# Patient Record
Sex: Female | Born: 1940 | Race: White | Hispanic: No | Marital: Married | Smoking: Never smoker
Health system: Southern US, Community
[De-identification: ages and names within clinical notes are randomized; demographics above are authoritative.]

## PROBLEM LIST (undated history)

## (undated) DIAGNOSIS — Z953 Presence of xenogenic heart valve: Secondary | ICD-10-CM

## (undated) DIAGNOSIS — I951 Orthostatic hypotension: Secondary | ICD-10-CM

## (undated) DIAGNOSIS — E785 Hyperlipidemia, unspecified: Secondary | ICD-10-CM

## (undated) DIAGNOSIS — R0609 Other forms of dyspnea: Secondary | ICD-10-CM

## (undated) DIAGNOSIS — I4891 Unspecified atrial fibrillation: Secondary | ICD-10-CM

## (undated) DIAGNOSIS — Q231 Congenital insufficiency of aortic valve: Secondary | ICD-10-CM

## (undated) DIAGNOSIS — I350 Nonrheumatic aortic (valve) stenosis: Secondary | ICD-10-CM

## (undated) DIAGNOSIS — K219 Gastro-esophageal reflux disease without esophagitis: Secondary | ICD-10-CM

## (undated) DIAGNOSIS — Z5181 Encounter for therapeutic drug level monitoring: Secondary | ICD-10-CM

## (undated) DIAGNOSIS — M1990 Unspecified osteoarthritis, unspecified site: Secondary | ICD-10-CM

## (undated) DIAGNOSIS — I5032 Chronic diastolic (congestive) heart failure: Secondary | ICD-10-CM

## (undated) DIAGNOSIS — E039 Hypothyroidism, unspecified: Secondary | ICD-10-CM

## (undated) DIAGNOSIS — Z9289 Personal history of other medical treatment: Secondary | ICD-10-CM

## (undated) DIAGNOSIS — M7989 Other specified soft tissue disorders: Secondary | ICD-10-CM

## (undated) DIAGNOSIS — G459 Transient cerebral ischemic attack, unspecified: Secondary | ICD-10-CM

## (undated) DIAGNOSIS — N390 Urinary tract infection, site not specified: Secondary | ICD-10-CM

## (undated) DIAGNOSIS — F419 Anxiety disorder, unspecified: Secondary | ICD-10-CM

## (undated) DIAGNOSIS — K222 Esophageal obstruction: Secondary | ICD-10-CM

## (undated) DIAGNOSIS — Z8719 Personal history of other diseases of the digestive system: Secondary | ICD-10-CM

## (undated) DIAGNOSIS — K52831 Collagenous colitis: Secondary | ICD-10-CM

## (undated) DIAGNOSIS — I9789 Other postprocedural complications and disorders of the circulatory system, not elsewhere classified: Secondary | ICD-10-CM

## (undated) HISTORY — DX: Encounter for therapeutic drug level monitoring: Z51.81

## (undated) HISTORY — PX: ESOPHAGEAL DILATION: SHX303

## (undated) HISTORY — DX: Orthostatic hypotension: I95.1

## (undated) HISTORY — PX: TONSILLECTOMY: SUR1361

## (undated) HISTORY — DX: Other forms of dyspnea: R06.09

## (undated) HISTORY — PX: CARDIAC CATHETERIZATION: SHX172

## (undated) HISTORY — DX: Congenital insufficiency of aortic valve: Q23.1

## (undated) HISTORY — PX: OTHER SURGICAL HISTORY: SHX169

## (undated) HISTORY — DX: Transient cerebral ischemic attack, unspecified: G45.9

## (undated) HISTORY — DX: Collagenous colitis: K52.831

## (undated) HISTORY — PX: APPENDECTOMY: SHX54

## (undated) HISTORY — DX: Nonrheumatic aortic (valve) stenosis: I35.0

## (undated) HISTORY — DX: Hypothyroidism, unspecified: E03.9

## (undated) HISTORY — PX: TOTAL ABDOMINAL HYSTERECTOMY: SHX209

## (undated) HISTORY — DX: Chronic diastolic (congestive) heart failure: I50.32

## (undated) HISTORY — DX: Personal history of other medical treatment: Z92.89

## (undated) HISTORY — PX: CHOLECYSTECTOMY: SHX55

## (undated) HISTORY — DX: Hyperlipidemia, unspecified: E78.5

## (undated) HISTORY — PX: BLADDER SURGERY: SHX569

---

## 1993-08-25 HISTORY — PX: NASAL SEPTUM SURGERY: SHX37

## 1999-09-25 ENCOUNTER — Other Ambulatory Visit: Admission: RE | Admit: 1999-09-25 | Discharge: 1999-09-25 | Payer: Self-pay | Admitting: Endocrinology

## 1999-11-19 ENCOUNTER — Ambulatory Visit (HOSPITAL_COMMUNITY): Admission: RE | Admit: 1999-11-19 | Discharge: 1999-11-19 | Payer: Self-pay | Admitting: Gastroenterology

## 2001-12-31 ENCOUNTER — Inpatient Hospital Stay (HOSPITAL_COMMUNITY): Admission: RE | Admit: 2001-12-31 | Discharge: 2002-01-02 | Payer: Self-pay | Admitting: Obstetrics and Gynecology

## 2003-08-15 ENCOUNTER — Ambulatory Visit (HOSPITAL_COMMUNITY): Admission: RE | Admit: 2003-08-15 | Discharge: 2003-08-15 | Payer: Self-pay | Admitting: Gastroenterology

## 2003-09-22 ENCOUNTER — Ambulatory Visit (HOSPITAL_COMMUNITY): Admission: RE | Admit: 2003-09-22 | Discharge: 2003-09-22 | Payer: Self-pay | Admitting: Gastroenterology

## 2003-10-09 ENCOUNTER — Encounter (HOSPITAL_COMMUNITY): Admission: RE | Admit: 2003-10-09 | Discharge: 2004-01-07 | Payer: Self-pay | Admitting: Endocrinology

## 2004-08-25 HISTORY — PX: THUMB ARTHROSCOPY: SHX2509

## 2004-11-15 ENCOUNTER — Ambulatory Visit: Payer: Self-pay

## 2010-11-22 ENCOUNTER — Other Ambulatory Visit (HOSPITAL_COMMUNITY): Payer: Self-pay | Admitting: Endocrinology

## 2010-11-22 DIAGNOSIS — R0600 Dyspnea, unspecified: Secondary | ICD-10-CM

## 2010-11-26 ENCOUNTER — Ambulatory Visit (HOSPITAL_COMMUNITY): Payer: Medicare Other | Attending: Endocrinology

## 2010-11-26 DIAGNOSIS — R0609 Other forms of dyspnea: Secondary | ICD-10-CM | POA: Insufficient documentation

## 2010-11-26 DIAGNOSIS — R011 Cardiac murmur, unspecified: Secondary | ICD-10-CM

## 2010-11-26 DIAGNOSIS — R0989 Other specified symptoms and signs involving the circulatory and respiratory systems: Secondary | ICD-10-CM | POA: Insufficient documentation

## 2010-11-26 DIAGNOSIS — R0600 Dyspnea, unspecified: Secondary | ICD-10-CM

## 2011-08-26 DIAGNOSIS — K222 Esophageal obstruction: Secondary | ICD-10-CM

## 2011-08-26 HISTORY — DX: Esophageal obstruction: K22.2

## 2011-09-01 DIAGNOSIS — Z1231 Encounter for screening mammogram for malignant neoplasm of breast: Secondary | ICD-10-CM | POA: Diagnosis not present

## 2011-09-01 DIAGNOSIS — Z803 Family history of malignant neoplasm of breast: Secondary | ICD-10-CM | POA: Diagnosis not present

## 2011-09-22 DIAGNOSIS — N993 Prolapse of vaginal vault after hysterectomy: Secondary | ICD-10-CM | POA: Diagnosis not present

## 2011-09-22 DIAGNOSIS — N8110 Cystocele, unspecified: Secondary | ICD-10-CM | POA: Diagnosis not present

## 2011-09-29 DIAGNOSIS — N8110 Cystocele, unspecified: Secondary | ICD-10-CM | POA: Diagnosis not present

## 2011-09-29 DIAGNOSIS — Z01818 Encounter for other preprocedural examination: Secondary | ICD-10-CM | POA: Diagnosis not present

## 2011-09-29 DIAGNOSIS — Z01811 Encounter for preprocedural respiratory examination: Secondary | ICD-10-CM | POA: Diagnosis not present

## 2011-09-29 DIAGNOSIS — I498 Other specified cardiac arrhythmias: Secondary | ICD-10-CM | POA: Diagnosis not present

## 2011-10-03 DIAGNOSIS — R011 Cardiac murmur, unspecified: Secondary | ICD-10-CM | POA: Diagnosis not present

## 2011-10-03 DIAGNOSIS — N760 Acute vaginitis: Secondary | ICD-10-CM | POA: Diagnosis not present

## 2011-10-03 DIAGNOSIS — K219 Gastro-esophageal reflux disease without esophagitis: Secondary | ICD-10-CM | POA: Diagnosis not present

## 2011-10-03 DIAGNOSIS — E039 Hypothyroidism, unspecified: Secondary | ICD-10-CM | POA: Diagnosis not present

## 2011-10-03 DIAGNOSIS — N390 Urinary tract infection, site not specified: Secondary | ICD-10-CM | POA: Diagnosis not present

## 2011-10-03 DIAGNOSIS — N993 Prolapse of vaginal vault after hysterectomy: Secondary | ICD-10-CM | POA: Diagnosis not present

## 2011-10-04 DIAGNOSIS — N390 Urinary tract infection, site not specified: Secondary | ICD-10-CM | POA: Diagnosis not present

## 2011-10-04 DIAGNOSIS — E039 Hypothyroidism, unspecified: Secondary | ICD-10-CM | POA: Diagnosis not present

## 2011-10-04 DIAGNOSIS — N993 Prolapse of vaginal vault after hysterectomy: Secondary | ICD-10-CM | POA: Diagnosis not present

## 2011-10-04 DIAGNOSIS — R011 Cardiac murmur, unspecified: Secondary | ICD-10-CM | POA: Diagnosis not present

## 2011-10-04 DIAGNOSIS — K219 Gastro-esophageal reflux disease without esophagitis: Secondary | ICD-10-CM | POA: Diagnosis not present

## 2011-10-05 DIAGNOSIS — E039 Hypothyroidism, unspecified: Secondary | ICD-10-CM | POA: Diagnosis not present

## 2011-10-05 DIAGNOSIS — N390 Urinary tract infection, site not specified: Secondary | ICD-10-CM | POA: Diagnosis not present

## 2011-10-05 DIAGNOSIS — R011 Cardiac murmur, unspecified: Secondary | ICD-10-CM | POA: Diagnosis not present

## 2011-10-05 DIAGNOSIS — K219 Gastro-esophageal reflux disease without esophagitis: Secondary | ICD-10-CM | POA: Diagnosis not present

## 2011-10-05 DIAGNOSIS — N993 Prolapse of vaginal vault after hysterectomy: Secondary | ICD-10-CM | POA: Diagnosis not present

## 2011-10-13 DIAGNOSIS — N8110 Cystocele, unspecified: Secondary | ICD-10-CM | POA: Diagnosis not present

## 2011-10-13 DIAGNOSIS — Z48816 Encounter for surgical aftercare following surgery on the genitourinary system: Secondary | ICD-10-CM | POA: Diagnosis not present

## 2011-11-10 DIAGNOSIS — N993 Prolapse of vaginal vault after hysterectomy: Secondary | ICD-10-CM | POA: Diagnosis not present

## 2011-12-02 DIAGNOSIS — K5289 Other specified noninfective gastroenteritis and colitis: Secondary | ICD-10-CM | POA: Diagnosis not present

## 2011-12-02 DIAGNOSIS — R209 Unspecified disturbances of skin sensation: Secondary | ICD-10-CM | POA: Diagnosis not present

## 2011-12-02 DIAGNOSIS — E039 Hypothyroidism, unspecified: Secondary | ICD-10-CM | POA: Diagnosis not present

## 2011-12-02 DIAGNOSIS — E785 Hyperlipidemia, unspecified: Secondary | ICD-10-CM | POA: Diagnosis not present

## 2012-02-02 DIAGNOSIS — Z48816 Encounter for surgical aftercare following surgery on the genitourinary system: Secondary | ICD-10-CM | POA: Diagnosis not present

## 2012-02-02 DIAGNOSIS — R32 Unspecified urinary incontinence: Secondary | ICD-10-CM | POA: Diagnosis not present

## 2012-05-04 DIAGNOSIS — E039 Hypothyroidism, unspecified: Secondary | ICD-10-CM | POA: Diagnosis not present

## 2012-05-04 DIAGNOSIS — N390 Urinary tract infection, site not specified: Secondary | ICD-10-CM | POA: Diagnosis not present

## 2012-05-04 DIAGNOSIS — K5289 Other specified noninfective gastroenteritis and colitis: Secondary | ICD-10-CM | POA: Diagnosis not present

## 2012-05-04 DIAGNOSIS — E785 Hyperlipidemia, unspecified: Secondary | ICD-10-CM | POA: Diagnosis not present

## 2012-05-04 DIAGNOSIS — Z23 Encounter for immunization: Secondary | ICD-10-CM | POA: Diagnosis not present

## 2012-05-17 DIAGNOSIS — M79609 Pain in unspecified limb: Secondary | ICD-10-CM | POA: Diagnosis not present

## 2012-09-01 DIAGNOSIS — Z1231 Encounter for screening mammogram for malignant neoplasm of breast: Secondary | ICD-10-CM | POA: Diagnosis not present

## 2012-10-05 DIAGNOSIS — E039 Hypothyroidism, unspecified: Secondary | ICD-10-CM | POA: Diagnosis not present

## 2012-10-05 DIAGNOSIS — E785 Hyperlipidemia, unspecified: Secondary | ICD-10-CM | POA: Diagnosis not present

## 2012-10-12 DIAGNOSIS — J984 Other disorders of lung: Secondary | ICD-10-CM | POA: Diagnosis not present

## 2012-10-12 DIAGNOSIS — Z23 Encounter for immunization: Secondary | ICD-10-CM | POA: Diagnosis not present

## 2012-10-12 DIAGNOSIS — Z124 Encounter for screening for malignant neoplasm of cervix: Secondary | ICD-10-CM | POA: Diagnosis not present

## 2012-10-12 DIAGNOSIS — K222 Esophageal obstruction: Secondary | ICD-10-CM | POA: Diagnosis not present

## 2012-10-12 DIAGNOSIS — Z0000 Encounter for general adult medical examination without abnormal findings: Secondary | ICD-10-CM | POA: Diagnosis not present

## 2012-10-12 DIAGNOSIS — R110 Nausea: Secondary | ICD-10-CM | POA: Diagnosis not present

## 2012-10-12 DIAGNOSIS — K5289 Other specified noninfective gastroenteritis and colitis: Secondary | ICD-10-CM | POA: Diagnosis not present

## 2012-10-14 DIAGNOSIS — R112 Nausea with vomiting, unspecified: Secondary | ICD-10-CM | POA: Diagnosis not present

## 2012-10-14 DIAGNOSIS — Z1212 Encounter for screening for malignant neoplasm of rectum: Secondary | ICD-10-CM | POA: Diagnosis not present

## 2012-10-14 DIAGNOSIS — R1310 Dysphagia, unspecified: Secondary | ICD-10-CM | POA: Diagnosis not present

## 2012-10-14 DIAGNOSIS — R634 Abnormal weight loss: Secondary | ICD-10-CM | POA: Diagnosis not present

## 2012-11-18 ENCOUNTER — Other Ambulatory Visit: Payer: Self-pay | Admitting: Gastroenterology

## 2012-11-18 DIAGNOSIS — R634 Abnormal weight loss: Secondary | ICD-10-CM | POA: Diagnosis not present

## 2012-11-18 DIAGNOSIS — D1330 Benign neoplasm of unspecified part of small intestine: Secondary | ICD-10-CM | POA: Diagnosis not present

## 2012-11-18 DIAGNOSIS — R1310 Dysphagia, unspecified: Secondary | ICD-10-CM | POA: Diagnosis not present

## 2012-11-18 DIAGNOSIS — K449 Diaphragmatic hernia without obstruction or gangrene: Secondary | ICD-10-CM | POA: Diagnosis not present

## 2012-11-18 DIAGNOSIS — K222 Esophageal obstruction: Secondary | ICD-10-CM | POA: Diagnosis not present

## 2012-12-13 DIAGNOSIS — K219 Gastro-esophageal reflux disease without esophagitis: Secondary | ICD-10-CM | POA: Diagnosis not present

## 2012-12-13 DIAGNOSIS — K222 Esophageal obstruction: Secondary | ICD-10-CM | POA: Diagnosis not present

## 2012-12-13 DIAGNOSIS — R1032 Left lower quadrant pain: Secondary | ICD-10-CM | POA: Diagnosis not present

## 2013-04-05 DIAGNOSIS — H52209 Unspecified astigmatism, unspecified eye: Secondary | ICD-10-CM | POA: Diagnosis not present

## 2013-04-05 DIAGNOSIS — H2510 Age-related nuclear cataract, unspecified eye: Secondary | ICD-10-CM | POA: Diagnosis not present

## 2013-04-12 DIAGNOSIS — K5289 Other specified noninfective gastroenteritis and colitis: Secondary | ICD-10-CM | POA: Diagnosis not present

## 2013-04-12 DIAGNOSIS — E039 Hypothyroidism, unspecified: Secondary | ICD-10-CM | POA: Diagnosis not present

## 2013-04-12 DIAGNOSIS — Z1331 Encounter for screening for depression: Secondary | ICD-10-CM | POA: Diagnosis not present

## 2013-04-12 DIAGNOSIS — N390 Urinary tract infection, site not specified: Secondary | ICD-10-CM | POA: Diagnosis not present

## 2013-04-12 DIAGNOSIS — K219 Gastro-esophageal reflux disease without esophagitis: Secondary | ICD-10-CM | POA: Diagnosis not present

## 2013-04-12 DIAGNOSIS — K222 Esophageal obstruction: Secondary | ICD-10-CM | POA: Diagnosis not present

## 2013-04-12 DIAGNOSIS — E785 Hyperlipidemia, unspecified: Secondary | ICD-10-CM | POA: Diagnosis not present

## 2013-04-12 DIAGNOSIS — D126 Benign neoplasm of colon, unspecified: Secondary | ICD-10-CM | POA: Diagnosis not present

## 2013-05-12 DIAGNOSIS — M1710 Unilateral primary osteoarthritis, unspecified knee: Secondary | ICD-10-CM | POA: Diagnosis not present

## 2013-05-19 DIAGNOSIS — Z23 Encounter for immunization: Secondary | ICD-10-CM | POA: Diagnosis not present

## 2013-09-02 DIAGNOSIS — Z1231 Encounter for screening mammogram for malignant neoplasm of breast: Secondary | ICD-10-CM | POA: Diagnosis not present

## 2013-10-10 DIAGNOSIS — E039 Hypothyroidism, unspecified: Secondary | ICD-10-CM | POA: Diagnosis not present

## 2013-10-10 DIAGNOSIS — E785 Hyperlipidemia, unspecified: Secondary | ICD-10-CM | POA: Diagnosis not present

## 2013-10-17 DIAGNOSIS — Z0000 Encounter for general adult medical examination without abnormal findings: Secondary | ICD-10-CM | POA: Diagnosis not present

## 2013-10-17 DIAGNOSIS — K5289 Other specified noninfective gastroenteritis and colitis: Secondary | ICD-10-CM | POA: Diagnosis not present

## 2013-10-17 DIAGNOSIS — K222 Esophageal obstruction: Secondary | ICD-10-CM | POA: Diagnosis not present

## 2013-10-17 DIAGNOSIS — K219 Gastro-esophageal reflux disease without esophagitis: Secondary | ICD-10-CM | POA: Diagnosis not present

## 2013-10-17 DIAGNOSIS — E039 Hypothyroidism, unspecified: Secondary | ICD-10-CM | POA: Diagnosis not present

## 2013-10-17 DIAGNOSIS — E785 Hyperlipidemia, unspecified: Secondary | ICD-10-CM | POA: Diagnosis not present

## 2013-10-17 DIAGNOSIS — D126 Benign neoplasm of colon, unspecified: Secondary | ICD-10-CM | POA: Diagnosis not present

## 2013-10-17 DIAGNOSIS — Q231 Congenital insufficiency of aortic valve: Secondary | ICD-10-CM | POA: Diagnosis not present

## 2013-11-22 DIAGNOSIS — Z780 Asymptomatic menopausal state: Secondary | ICD-10-CM | POA: Diagnosis not present

## 2014-04-11 DIAGNOSIS — H40019 Open angle with borderline findings, low risk, unspecified eye: Secondary | ICD-10-CM | POA: Diagnosis not present

## 2014-04-11 DIAGNOSIS — H2510 Age-related nuclear cataract, unspecified eye: Secondary | ICD-10-CM | POA: Diagnosis not present

## 2014-04-11 DIAGNOSIS — H52209 Unspecified astigmatism, unspecified eye: Secondary | ICD-10-CM | POA: Diagnosis not present

## 2014-04-11 DIAGNOSIS — H524 Presbyopia: Secondary | ICD-10-CM | POA: Diagnosis not present

## 2014-04-17 DIAGNOSIS — I951 Orthostatic hypotension: Secondary | ICD-10-CM | POA: Diagnosis not present

## 2014-04-17 DIAGNOSIS — R300 Dysuria: Secondary | ICD-10-CM | POA: Diagnosis not present

## 2014-04-17 DIAGNOSIS — E785 Hyperlipidemia, unspecified: Secondary | ICD-10-CM | POA: Diagnosis not present

## 2014-04-17 DIAGNOSIS — Z6827 Body mass index (BMI) 27.0-27.9, adult: Secondary | ICD-10-CM | POA: Diagnosis not present

## 2014-04-17 DIAGNOSIS — E039 Hypothyroidism, unspecified: Secondary | ICD-10-CM | POA: Diagnosis not present

## 2014-04-17 DIAGNOSIS — K5289 Other specified noninfective gastroenteritis and colitis: Secondary | ICD-10-CM | POA: Diagnosis not present

## 2014-05-09 ENCOUNTER — Encounter: Payer: Self-pay | Admitting: Cardiology

## 2014-05-09 ENCOUNTER — Ambulatory Visit (INDEPENDENT_AMBULATORY_CARE_PROVIDER_SITE_OTHER): Payer: Medicare Other | Admitting: Cardiology

## 2014-05-09 VITALS — BP 118/80 | HR 83 | Wt 161.0 lb

## 2014-05-09 DIAGNOSIS — Q231 Congenital insufficiency of aortic valve: Secondary | ICD-10-CM

## 2014-05-09 DIAGNOSIS — I951 Orthostatic hypotension: Secondary | ICD-10-CM

## 2014-05-09 DIAGNOSIS — R0989 Other specified symptoms and signs involving the circulatory and respiratory systems: Secondary | ICD-10-CM

## 2014-05-09 DIAGNOSIS — R0609 Other forms of dyspnea: Secondary | ICD-10-CM

## 2014-05-09 LAB — BRAIN NATRIURETIC PEPTIDE: PRO B NATRI PEPTIDE: 53 pg/mL (ref 0.0–100.0)

## 2014-05-09 NOTE — Patient Instructions (Addendum)
Your physician recommends that you have  lab work today--BNP.  Your physician has requested that you have an echocardiogram. Echocardiography is a painless test that uses sound waves to create images of your heart. It provides your doctor with information about the size and shape of your heart and how well your heart's chambers and valves are working. This procedure takes approximately one hour. There are no restrictions for this procedure.  Your physician has requested that you have en exercise stress myoview. For further information please visit [email or web address]. Please follow instruction sheet, as given. PLEASE SCHEDULE THIS AFTER THE ECHOCARDIOGRAM HAS BEEN DONE TO ENSURE THE PATIENT DOES NOT HAVE SEVERE AORTIC STENOSIS   Schedule an appointment for an MRA of your chest.   Your physician recommends that you schedule a follow-up appointment in: 3-4 weeks with Dr Aundra Dubin.   Use knee high graded compression stockings to help the swelling in your feet and legs. Put them on in the morning and take them off at night.

## 2014-05-10 DIAGNOSIS — I951 Orthostatic hypotension: Secondary | ICD-10-CM

## 2014-05-10 DIAGNOSIS — R0600 Dyspnea, unspecified: Secondary | ICD-10-CM

## 2014-05-10 DIAGNOSIS — R0609 Other forms of dyspnea: Secondary | ICD-10-CM

## 2014-05-10 DIAGNOSIS — Q231 Congenital insufficiency of aortic valve: Secondary | ICD-10-CM | POA: Insufficient documentation

## 2014-05-10 DIAGNOSIS — Q2381 Bicuspid aortic valve: Secondary | ICD-10-CM

## 2014-05-10 HISTORY — DX: Dyspnea, unspecified: R06.00

## 2014-05-10 HISTORY — DX: Orthostatic hypotension: I95.1

## 2014-05-10 HISTORY — DX: Congenital insufficiency of aortic valve: Q23.1

## 2014-05-10 HISTORY — DX: Other forms of dyspnea: R06.09

## 2014-05-10 HISTORY — DX: Bicuspid aortic valve: Q23.81

## 2014-05-10 NOTE — Progress Notes (Signed)
Patient ID: Becky Winters, female   DOB: [birth year], 73 y.o.   MRN: [id number] PCP: Dr. Forde Dandy  73 yo with history of bicuspid aortic valve and mild aortic stenosis, hyperlipidemia, and h/o prior TIA presents for cardiology evaluation of orthostatic-type symptoms as well as exertional dyspnea.  Over the last few months, patient's home BP readings have been in the 62G-315V systolic. She has occasional lightheadedness when doing activities like getting dressed, cooking, etc (not always).  BP today was 122/70.  She was not orthostatic today when we measured though she was mildly lightheaded with standing. TSH and cortisol were normal recently.  No falls or syncope.  She additionally reports dyspnea when she bends over.  She is short of breath doing yardwork, getting dressed, or with other moderate activities.  No chest pain.  She is not short of breath walking on flat ground.  She is not taking aspirin due to her collagenous colitis.   ECG: NSR, normal  Labs (8/15): K 5, creatinine 0.9, cortisol normal, LDL 58, HDL 38, TSH/free T4 normal, LFTs normal  PMH: 1. Collagenous colitis 2. TIA 3. Hypothyroidism 4. Hyperlipidemia 5. Cardiolite in 2003 and 2006 normal.  6. Esophageal stricture. 7. Bicuspid aortic valve: Diagnosed on 4/12 echo, mild AS.  8. Cholecystectomy 9. TAH 10. Appendectomy  SH: Married, 2 children, nonsmoker, lives in [place].   FH: Father with CAD diagnosed at 38, grandmother with CVA.   ROS: All systems reviewed and negative except as per HPI.   Current Outpatient Prescriptions  Medication Sig Dispense Refill  . atorvastatin (LIPITOR) 40 MG tablet Take 40 mg by mouth daily. Takes 1/2 each night      . b complex vitamins capsule Take 1 capsule by mouth daily.      . beta carotene w/minerals (OCUVITE) tablet Take 1 tablet by mouth daily.      . Biotin 5000 MCG CAPS Take by mouth.      . Cholecalciferol (VITAMIN D3) 1000 UNITS CAPS Take by mouth.      . Cinnamon 500 MG  TABS Take by mouth.      . ciprofloxacin (CIPRO) 500 MG tablet       . co-enzyme Q-10 50 MG capsule Take 100 mg by mouth 2 (two) times daily.      . diphenhydrAMINE (SOMINEX) 25 MG tablet Take 25 mg by mouth at bedtime as needed for sleep.      . folic acid (FOLVITE) 761 MCG tablet Take 400 mcg by mouth daily.      . lansoprazole (PREVACID) 15 MG capsule Take 15 mg by mouth as needed.      Marland Kitchen levothyroxine (SYNTHROID, LEVOTHROID) 125 MCG tablet       . Melatonin-Pyridoxine (MELATIN PO) Take by mouth.      . Multiple Vitamins-Minerals (MULTIVITAMIN WITH MINERALS) tablet Take 1 tablet by mouth daily.      Jonna Coup Leaf Extract 500 MG CAPS Take by mouth.      . Omega-3 Fatty Acids (FISH OIL CONCENTRATE) 1000 MG CAPS Take 1,000 mg by mouth 2 (two) times daily.      . Probiotic Product (ALIGN PO) Take by mouth.      . vitamin E 400 UNIT capsule Take 400 Units by mouth daily.       No current facility-administered medications for this visit.    BP 118/80  Pulse 83  Wt 161 lb (73.029 kg) General: NAD Neck: No JVD, no thyromegaly or thyroid nodule.  Lungs: Clear  to auscultation bilaterally with normal respiratory effort. CV: Nondisplaced PMI.  Heart regular S1/S2, no S3/S4, 3/6 crescendo-decrescendo systolic murmur, S2 heard well.  No peripheral edema.  No carotid bruit.  Normal pedal pulses.  Abdomen: Soft, nontender, no hepatosplenomegaly, no distention.  Skin: Intact without lesions or rashes.  Neurologic: Alert and oriented x 3.  Psych: Normal affect. Extremities: No clubbing or cyanosis.  HEENT: Normal.   Assessment/Plan: 1. Aortic stenosis: Bicuspid aortic valve disorder.  She will need an echo to assess for progression of aortic stenosis.  If this has worsened, it could explain her dyspnea and her lightheadedness.  Given the bicuspid valve, I am going to get an MRA of her chest to assess for thoracic aortic aneurysm which often coexists with a bicuspid aortic valve.   2. Exertional  dyspnea: She is not volume overloaded on exam.  I will, however, send a BNP.  I am also getting an echo as above.  Given her hyperlipidemia and family history of vascular disease, I think that it would be reasonable to make sure that her exertional symptoms are not ischemia-related.  I will arrange for ETT-Cardiolite (as long she does not have severe AS, will get echo 1st).  3. Orthostatic symptoms: She does not have orthostatic hypotension by BP measurement today, but has had dizziness with activities over the last couple of months.   - I will have her wear graded compression stockings.  - As above, will get echo to look for worsening AS.  Loralie Champagne 05/10/2014

## 2014-05-15 ENCOUNTER — Encounter: Payer: Self-pay | Admitting: Internal Medicine

## 2014-05-16 ENCOUNTER — Ambulatory Visit (HOSPITAL_COMMUNITY): Payer: Medicare Other | Attending: Cardiology | Admitting: Cardiology

## 2014-05-16 DIAGNOSIS — R0609 Other forms of dyspnea: Secondary | ICD-10-CM | POA: Insufficient documentation

## 2014-05-16 DIAGNOSIS — I359 Nonrheumatic aortic valve disorder, unspecified: Secondary | ICD-10-CM

## 2014-05-16 DIAGNOSIS — Q231 Congenital insufficiency of aortic valve: Secondary | ICD-10-CM | POA: Insufficient documentation

## 2014-05-16 DIAGNOSIS — R0989 Other specified symptoms and signs involving the circulatory and respiratory systems: Secondary | ICD-10-CM | POA: Insufficient documentation

## 2014-05-16 DIAGNOSIS — E785 Hyperlipidemia, unspecified: Secondary | ICD-10-CM | POA: Diagnosis not present

## 2014-05-16 NOTE — Progress Notes (Signed)
Echo performed. 

## 2014-05-17 ENCOUNTER — Telehealth: Payer: Self-pay

## 2014-05-17 ENCOUNTER — Other Ambulatory Visit: Payer: Self-pay

## 2014-05-17 NOTE — Telephone Encounter (Signed)
Called patient to tell her that Aundra Dubin cancelled her stress test. (And appointment has been cancelled) Informed patient that [place], will call her when she is back in the office with an appointment. Told patient to call the office with any questions or concerns.

## 2014-05-18 ENCOUNTER — Encounter: Payer: Self-pay | Admitting: *Deleted

## 2014-05-19 ENCOUNTER — Encounter: Payer: Self-pay | Admitting: Cardiology

## 2014-05-19 ENCOUNTER — Encounter: Payer: Self-pay | Admitting: *Deleted

## 2014-05-19 ENCOUNTER — Ambulatory Visit (HOSPITAL_COMMUNITY)
Admission: RE | Admit: 2014-05-19 | Discharge: 2014-05-19 | Disposition: A | Discharge status: Home / Self Care | Payer: Medicare Other | Source: Ambulatory Visit | Attending: Cardiology | Admitting: Cardiology

## 2014-05-19 ENCOUNTER — Ambulatory Visit (INDEPENDENT_AMBULATORY_CARE_PROVIDER_SITE_OTHER): Payer: Medicare Other | Admitting: Cardiology

## 2014-05-19 VITALS — BP 132/68 | HR 64 | Ht 65.0 in | Wt 156.0 lb

## 2014-05-19 DIAGNOSIS — Q231 Congenital insufficiency of aortic valve: Secondary | ICD-10-CM | POA: Diagnosis not present

## 2014-05-19 DIAGNOSIS — R079 Chest pain, unspecified: Secondary | ICD-10-CM | POA: Diagnosis not present

## 2014-05-19 DIAGNOSIS — I359 Nonrheumatic aortic valve disorder, unspecified: Secondary | ICD-10-CM

## 2014-05-19 DIAGNOSIS — I350 Nonrheumatic aortic (valve) stenosis: Secondary | ICD-10-CM

## 2014-05-19 DIAGNOSIS — R0989 Other specified symptoms and signs involving the circulatory and respiratory systems: Secondary | ICD-10-CM | POA: Diagnosis not present

## 2014-05-19 DIAGNOSIS — R0609 Other forms of dyspnea: Secondary | ICD-10-CM | POA: Insufficient documentation

## 2014-05-19 LAB — CBC WITH DIFFERENTIAL/PLATELET
BASOS PCT: 0.4 % (ref 0.0–3.0)
Basophils Absolute: 0 10*3/uL (ref 0.0–0.1)
EOS PCT: 1.2 % (ref 0.0–5.0)
Eosinophils Absolute: 0.1 10*3/uL (ref 0.0–0.7)
HEMATOCRIT: 41.3 % (ref 36.0–46.0)
Hemoglobin: 13.8 g/dL (ref 12.0–15.0)
LYMPHS ABS: 2.7 10*3/uL (ref 0.7–4.0)
Lymphocytes Relative: 33 % (ref 12.0–46.0)
MCHC: 33.5 g/dL (ref 30.0–36.0)
MCV: 83.9 fl (ref 78.0–100.0)
MONO ABS: 0.7 10*3/uL (ref 0.1–1.0)
MONOS PCT: 8.7 % (ref 3.0–12.0)
Neutro Abs: 4.7 10*3/uL (ref 1.4–7.7)
Neutrophils Relative %: 56.7 % (ref 43.0–77.0)
PLATELETS: 339 10*3/uL (ref 150.0–400.0)
RBC: 4.92 Mil/uL (ref 3.87–5.11)
RDW: 16.3 % — ABNORMAL HIGH (ref 11.5–15.5)
WBC: 8.3 10*3/uL (ref 4.0–10.5)

## 2014-05-19 LAB — PROTIME-INR
INR: 1 ratio (ref 0.8–1.0)
PROTHROMBIN TIME: 11.5 s (ref 9.6–13.1)

## 2014-05-19 LAB — BASIC METABOLIC PANEL
BUN: 17 mg/dL (ref 6–23)
CHLORIDE: 105 meq/L (ref 96–112)
CO2: 23 mEq/L (ref 19–32)
Calcium: 9.2 mg/dL (ref 8.4–10.5)
Creatinine, Ser: 1 mg/dL (ref 0.4–1.2)
GFR: 58.78 mL/min — AB (ref >60.00)
Glucose, Bld: 98 mg/dL (ref 70–99)
POTASSIUM: 4.4 meq/L (ref 3.5–5.1)
SODIUM: 137 meq/L (ref 135–145)

## 2014-05-19 LAB — POCT I-STAT CREATININE: CREATININE: 0.9 mg/dL (ref 0.50–1.10)

## 2014-05-19 MED ORDER — GADOBENATE DIMEGLUMINE 529 MG/ML IV SOLN
15.0000 mL | Freq: Once | INTRAVENOUS | Status: AC | PRN
  Administered 2014-05-19: 15 mL via INTRAVENOUS

## 2014-05-19 MED ORDER — ALPRAZOLAM 0.25 MG PO TABS: 0.2500 mg | ORAL_TABLET | Freq: Every evening | ORAL | Status: DC | PRN

## 2014-05-19 NOTE — Patient Instructions (Signed)
Your physician recommends that you have  lab work today--BMET/CBCd/PT/INR.  Your physician has requested that you have a cardiac catheterization. Cardiac catheterization is used to diagnose and/or treat various heart conditions. Doctors may recommend this procedure for a number of different reasons. The most common reason is to evaluate chest pain. Chest pain can be a symptom of coronary artery disease (CAD), and cardiac catheterization can show whether plaque is narrowing or blocking your heart's arteries. This procedure is also used to evaluate the valves, as well as measure the blood flow and oxygen levels in different parts of your heart. For further information please visit [email or web address]. Please follow instruction sheet, as given. Thursday October 1,2015  You have been referred to Dr Roxy Manns or Dr Cyndia Bent, first available.  Your physician recommends that you schedule a follow-up appointment in: about 3 weeks with the PA/NP.

## 2014-05-21 DIAGNOSIS — I350 Nonrheumatic aortic (valve) stenosis: Secondary | ICD-10-CM

## 2014-05-21 HISTORY — DX: Nonrheumatic aortic (valve) stenosis: I35.0

## 2014-05-21 NOTE — Progress Notes (Signed)
Patient ID: Becky Winters, female   DOB: 12-25-1940, 73 y.o.   MRN: [id number] PCP: Dr. Forde Dandy  73 yo with history of bicuspid aortic valve and mild aortic stenosis, hyperlipidemia, and h/o prior TIA presented at last appointment for evaluation of orthostatic-type symptoms as well as exertional dyspnea.  Over the last few months, patient's home BP readings have been in the 15V-761Y systolic. She has occasional lightheadedness when doing activities like getting dressed, cooking, etc (not always).  No falls or syncope.  She additionally reports dyspnea when she bends over.  She is short of breath doing yardwork, getting dressed, or with other moderate activities.  No chest pain.  She is not short of breath walking on flat ground.    I sent her for an echocardiogram which showed severe aortic stenosis with a bicuspid aortic valve.  MRA chest showed 3.8 cm ascending aorta.  Symptoms are unchanged.    Labs (8/15): K 5, creatinine 0.9, cortisol normal, LDL 58, HDL 38, TSH/free T4 normal, LFTs normal Labs (9/15): BNP 53  PMH: 1. Collagenous colitis 2. TIA 3. Hypothyroidism 4. Hyperlipidemia 5. Cardiolite in 2003 and 2006 normal.  6. Esophageal stricture. 7. Bicuspid aortic valve: Diagnosed on 4/12 echo with mild AS.  Repeat echo (9/15) with EF 60-65%, bicuspid aortic valve with severe AS (mean gradient 37 mmHg, peak gradient 68 mmHg, AVA 0.6-0.7 cm^2).  MRA chest (9/15) with 3.8 cm ascending aorta.   8. Cholecystectomy 9. TAH 10. Appendectomy  SH: Married, 2 children, nonsmoker, lives in [place].   FH: Father with CAD diagnosed at 34, grandmother with CVA.   ROS: All systems reviewed and negative except as per HPI.   Current Outpatient Prescriptions  Medication Sig Dispense Refill  . atorvastatin (LIPITOR) 40 MG tablet Take 40 mg by mouth daily. Takes 1/2 each night      . b complex vitamins capsule Take 1 capsule by mouth daily.      . beta carotene w/minerals (OCUVITE) tablet Take 1  tablet by mouth daily.      . Biotin 5000 MCG CAPS Take by mouth.      . Cholecalciferol (VITAMIN D3) 1000 UNITS CAPS Take by mouth.      . Cinnamon 500 MG TABS Take by mouth.      . ciprofloxacin (CIPRO) 500 MG tablet       . co-enzyme Q-10 50 MG capsule Take 100 mg by mouth 2 (two) times daily.      . DiphenhydrAMINE HCl, Sleep, (SOMINEX MAXIMUM STRENGTH) 50 MG tablet Take 50 mg by mouth at bedtime as needed for sleep.      . folic acid (FOLVITE) 073 MCG tablet Take 400 mcg by mouth daily.      . lansoprazole (PREVACID) 15 MG capsule Take 15 mg by mouth as needed.      Marland Kitchen levothyroxine (SYNTHROID, LEVOTHROID) 125 MCG tablet       . Melatonin-Pyridoxine (MELATIN PO) Take by mouth.      . Multiple Vitamins-Minerals (MULTIVITAMIN WITH MINERALS) tablet Take 1 tablet by mouth daily.      Jonna Coup Leaf Extract 500 MG CAPS Take by mouth.      . Omega-3 Fatty Acids (FISH OIL CONCENTRATE) 1000 MG CAPS Take 1,000 mg by mouth 2 (two) times daily.      . Probiotic Product (ALIGN PO) Take by mouth.      . vitamin E 400 UNIT capsule Take 400 Units by mouth daily.      Marland Kitchen  ALPRAZolam (XANAX) 0.25 MG tablet Take 1 tablet (0.25 mg total) by mouth at bedtime as needed for anxiety.  30 tablet  0   No current facility-administered medications for this visit.    BP 132/68  Pulse 64  Ht 5\' 5"  (1.651 m)  Wt 156 lb (70.761 kg)  BMI 25.96 kg/m2  SpO2 98% General: NAD Neck: No JVD, no thyromegaly or thyroid nodule.  Lungs: Clear to auscultation bilaterally with normal respiratory effort. CV: Nondisplaced PMI.  Heart regular S1/S2, no S3/S4, 3/6 crescendo-decrescendo systolic murmur, S2 heard well.  Trace ankle edema.  No carotid bruit.  Normal pedal pulses.  Abdomen: Soft, nontender, no hepatosplenomegaly, no distention.  Skin: Intact without lesions or rashes.  Neurologic: Alert and oriented x 3.  Psych: Normal affect. Extremities: No clubbing or cyanosis.   Assessment/Plan: 1. Aortic stenosis: Bicuspid  aortic valve disorder with severe aortic stenosis on echo done this month.  She has an ectatic ascending aorta (3.8 cm) but it is not large enough that it would need repair.  She is symptomatic with new exertional dyspnea and lightheadedness.   - She is going to need valve replacement.  She is probably too health to qualify for TAVR.  I will refer her to [facility] for surgical evaluation.  - LHC/RHC next week prior to surgery.  - BMET/CBC pre-cath.  2. Hyperlipidemia: Good lipids on atorvastatin.   Loralie Champagne 05/21/2014

## 2014-05-22 ENCOUNTER — Encounter (HOSPITAL_COMMUNITY): Payer: Self-pay | Admitting: Pharmacy Technician

## 2014-05-23 ENCOUNTER — Encounter (HOSPITAL_COMMUNITY): Payer: Medicare Other

## 2014-05-25 ENCOUNTER — Ambulatory Visit (HOSPITAL_COMMUNITY)
Admission: RE | Admit: 2014-05-25 | Discharge: 2014-05-25 | Disposition: A | Discharge status: Home / Self Care | Payer: Medicare Other | Source: Ambulatory Visit | Attending: Cardiology | Admitting: Cardiology

## 2014-05-25 ENCOUNTER — Encounter (HOSPITAL_COMMUNITY)
Admission: RE | Disposition: A | Discharge status: Home / Self Care | Payer: Self-pay | Source: Ambulatory Visit | Attending: Cardiology

## 2014-05-25 DIAGNOSIS — I350 Nonrheumatic aortic (valve) stenosis: Secondary | ICD-10-CM | POA: Insufficient documentation

## 2014-05-25 DIAGNOSIS — I2510 Atherosclerotic heart disease of native coronary artery without angina pectoris: Secondary | ICD-10-CM | POA: Diagnosis not present

## 2014-05-25 DIAGNOSIS — Z9049 Acquired absence of other specified parts of digestive tract: Secondary | ICD-10-CM | POA: Diagnosis not present

## 2014-05-25 DIAGNOSIS — E785 Hyperlipidemia, unspecified: Secondary | ICD-10-CM | POA: Diagnosis not present

## 2014-05-25 DIAGNOSIS — Z8673 Personal history of transient ischemic attack (TIA), and cerebral infarction without residual deficits: Secondary | ICD-10-CM | POA: Diagnosis not present

## 2014-05-25 DIAGNOSIS — Q231 Congenital insufficiency of aortic valve: Secondary | ICD-10-CM | POA: Diagnosis not present

## 2014-05-25 DIAGNOSIS — K222 Esophageal obstruction: Secondary | ICD-10-CM | POA: Diagnosis not present

## 2014-05-25 HISTORY — PX: LEFT AND RIGHT HEART CATHETERIZATION WITH CORONARY ANGIOGRAM: SHX5449

## 2014-05-25 LAB — POCT I-STAT 3, ART BLOOD GAS (G3+)
ACID-BASE DEFICIT: 2 mmol/L (ref 0.0–2.0)
Bicarbonate: 22.7 mEq/L (ref 20.0–24.0)
O2 SAT: 97 %
PCO2 ART: 37.1 mmHg (ref 35.0–45.0)
PO2 ART: 88 mmHg (ref 80.0–100.0)
TCO2: 24 mmol/L (ref 0–100)
pH, Arterial: 7.394 (ref 7.350–7.450)

## 2014-05-25 LAB — POCT I-STAT 3, VENOUS BLOOD GAS (G3P V)
Acid-base deficit: 2 mmol/L (ref 0.0–2.0)
Bicarbonate: 22.9 mEq/L (ref 20.0–24.0)
O2 SAT: 81 %
PCO2 VEN: 39.1 mmHg — AB (ref 45.0–50.0)
PO2 VEN: 46 mmHg — AB (ref 30.0–45.0)
TCO2: 24 mmol/L (ref 0–100)
pH, Ven: 7.375 — ABNORMAL HIGH (ref 7.250–7.300)

## 2014-05-25 SURGERY — LEFT AND RIGHT HEART CATHETERIZATION WITH CORONARY ANGIOGRAM
Anesthesia: LOCAL

## 2014-05-25 MED ORDER — ONDANSETRON HCL 4 MG/2ML IJ SOLN: 4.0000 mg | Freq: Four times a day (QID) | INTRAMUSCULAR | Status: DC | PRN

## 2014-05-25 MED ORDER — MIDAZOLAM HCL 2 MG/2ML IJ SOLN
INTRAMUSCULAR | Status: AC
  Filled 2014-05-25: qty 2

## 2014-05-25 MED ORDER — ASPIRIN 81 MG PO CHEW
CHEWABLE_TABLET | ORAL | Status: DC
Start: 2014-05-25 — End: 2014-05-25
  Filled 2014-05-25: qty 1

## 2014-05-25 MED ORDER — ACETAMINOPHEN 325 MG PO TABS: 650.0000 mg | ORAL_TABLET | ORAL | Status: DC | PRN

## 2014-05-25 MED ORDER — HEPARIN SODIUM (PORCINE) 1000 UNIT/ML IJ SOLN
INTRAMUSCULAR | Status: AC
  Filled 2014-05-25: qty 1

## 2014-05-25 MED ORDER — ASPIRIN 81 MG PO CHEW
81.0000 mg | CHEWABLE_TABLET | ORAL | Status: AC
  Administered 2014-05-25: 81 mg via ORAL

## 2014-05-25 MED ORDER — SODIUM CHLORIDE 0.9 % IJ SOLN
3.0000 mL | Freq: Two times a day (BID) | INTRAMUSCULAR | Status: DC
Start: 2014-05-25 — End: 2014-05-25

## 2014-05-25 MED ORDER — HEPARIN (PORCINE) IN NACL 2-0.9 UNIT/ML-% IJ SOLN
INTRAMUSCULAR | Status: AC
  Filled 2014-05-25: qty 1500

## 2014-05-25 MED ORDER — LIDOCAINE HCL (PF) 1 % IJ SOLN
INTRAMUSCULAR | Status: AC
  Filled 2014-05-25: qty 30

## 2014-05-25 MED ORDER — SODIUM CHLORIDE 0.9 % IV SOLN: 250.0000 mL | INTRAVENOUS | Status: DC | PRN

## 2014-05-25 MED ORDER — SODIUM CHLORIDE 0.9 % IV SOLN: INTRAVENOUS | Status: AC

## 2014-05-25 MED ORDER — SODIUM CHLORIDE 0.9 % IJ SOLN
3.0000 mL | INTRAMUSCULAR | Status: DC | PRN
Start: 2014-05-25 — End: 2014-05-25

## 2014-05-25 MED ORDER — FENTANYL CITRATE 0.05 MG/ML IJ SOLN
INTRAMUSCULAR | Status: AC
  Filled 2014-05-25: qty 2

## 2014-05-25 MED ORDER — VERAPAMIL HCL 2.5 MG/ML IV SOLN
INTRAVENOUS | Status: AC
  Filled 2014-05-25: qty 2

## 2014-05-25 MED ORDER — SODIUM CHLORIDE 0.9 % IV SOLN
INTRAVENOUS | Status: DC
  Administered 2014-05-25: 08:00:00 via INTRAVENOUS

## 2014-05-25 NOTE — Discharge Instructions (Signed)
Radial Site Care °Refer to this sheet in the next few weeks. These instructions provide you with information on caring for yourself after your procedure. Your caregiver may also give you more specific instructions. Your treatment has been planned according to current medical practices, but problems sometimes occur. Call your caregiver if you have any problems or questions after your procedure. °HOME CARE INSTRUCTIONS °· You may shower the day after the procedure. Remove the bandage (dressing) and gently wash the site with plain soap and water. Gently pat the site dry. °· Do not apply powder or lotion to the site. °· Do not submerge the affected site in water for 3 to 5 days. °· Inspect the site at least twice daily. °· Do not flex or bend the affected arm for 24 hours. °· No lifting over 5 pounds (2.3 kg) for 5 days after your procedure. °· Do not drive home if you are discharged the same day of the procedure. Have someone else drive you. °· You may drive 24 hours after the procedure unless otherwise instructed by your caregiver. °· Do not operate machinery or power tools for 24 hours. °· A responsible adult should be with you for the first 24 hours after you arrive home. °What to expect: °· Any bruising will usually fade within 1 to 2 weeks. °· Blood that collects in the tissue (hematoma) may be painful to the touch. It should usually decrease in size and tenderness within 1 to 2 weeks. °SEEK IMMEDIATE MEDICAL CARE IF: °· You have unusual pain at the radial site. °· You have redness, warmth, swelling, or pain at the radial site. °· You have drainage (other than a small amount of blood on the dressing). °· You have chills. °· You have a fever or persistent symptoms for more than 72 hours. °· You have a fever and your symptoms suddenly get worse. °· Your arm becomes pale, cool, tingly, or numb. °· You have heavy bleeding from the site. Hold pressure on the site. °Document Released: 09/13/2010 Document Revised:  11/03/2011 Document Reviewed: 09/13/2010 °ExitCare® Patient Information ©2015 [employer]. This information is not intended to replace advice given to you by your health care provider. Make sure you discuss any questions you have with your health care provider. ° °

## 2014-05-25 NOTE — Progress Notes (Signed)
Removed 3cc of air from TRB at 12:00noon.  Monitored site and noticed oozing from puncture site.  Replaced 3cc of air and site stable.  No oozing after air returned.  Will monitor and resume removal of air per protocol.

## 2014-05-25 NOTE — H&P (View-Only) (Signed)
Patient ID: Becky Winters, female   DOB: [birth year], 73 y.o.   MRN: [id number] PCP: Dr. Forde Dandy  73 yo with history of bicuspid aortic valve and mild aortic stenosis, hyperlipidemia, and h/o prior TIA presented at last appointment for evaluation of orthostatic-type symptoms as well as exertional dyspnea.  Over the last few months, patient's home BP readings have been in the 70W-237S systolic. She has occasional lightheadedness when doing activities like getting dressed, cooking, etc (not always).  No falls or syncope.  She additionally reports dyspnea when she bends over.  She is short of breath doing yardwork, getting dressed, or with other moderate activities.  No chest pain.  She is not short of breath walking on flat ground.    I sent her for an echocardiogram which showed severe aortic stenosis with a bicuspid aortic valve.  MRA chest showed 3.8 cm ascending aorta.  Symptoms are unchanged.    Labs (8/15): K 5, creatinine 0.9, cortisol normal, LDL 58, HDL 38, TSH/free T4 normal, LFTs normal Labs (9/15): BNP 53  PMH: 1. Collagenous colitis 2. TIA 3. Hypothyroidism 4. Hyperlipidemia 5. Cardiolite in 2003 and 2006 normal.  6. Esophageal stricture. 7. Bicuspid aortic valve: Diagnosed on 4/12 echo with mild AS.  Repeat echo (9/15) with EF 60-65%, bicuspid aortic valve with severe AS (mean gradient 37 mmHg, peak gradient 68 mmHg, AVA 0.6-0.7 cm^2).  MRA chest (9/15) with 3.8 cm ascending aorta.   8. Cholecystectomy 9. TAH 10. Appendectomy  SH: Married, 2 children, nonsmoker, lives in [place].   FH: Father with CAD diagnosed at 66, grandmother with CVA.   ROS: All systems reviewed and negative except as per HPI.   Current Outpatient Prescriptions  Medication Sig Dispense Refill  . atorvastatin (LIPITOR) 40 MG tablet Take 40 mg by mouth daily. Takes 1/2 each night      . b complex vitamins capsule Take 1 capsule by mouth daily.      . beta carotene w/minerals (OCUVITE) tablet Take 1  tablet by mouth daily.      . Biotin 5000 MCG CAPS Take by mouth.      . Cholecalciferol (VITAMIN D3) 1000 UNITS CAPS Take by mouth.      . Cinnamon 500 MG TABS Take by mouth.      . ciprofloxacin (CIPRO) 500 MG tablet       . co-enzyme Q-10 50 MG capsule Take 100 mg by mouth 2 (two) times daily.      . DiphenhydrAMINE HCl, Sleep, (SOMINEX MAXIMUM STRENGTH) 50 MG tablet Take 50 mg by mouth at bedtime as needed for sleep.      . folic acid (FOLVITE) 283 MCG tablet Take 400 mcg by mouth daily.      . lansoprazole (PREVACID) 15 MG capsule Take 15 mg by mouth as needed.      Marland Kitchen levothyroxine (SYNTHROID, LEVOTHROID) 125 MCG tablet       . Melatonin-Pyridoxine (MELATIN PO) Take by mouth.      . Multiple Vitamins-Minerals (MULTIVITAMIN WITH MINERALS) tablet Take 1 tablet by mouth daily.      Jonna Coup Leaf Extract 500 MG CAPS Take by mouth.      . Omega-3 Fatty Acids (FISH OIL CONCENTRATE) 1000 MG CAPS Take 1,000 mg by mouth 2 (two) times daily.      . Probiotic Product (ALIGN PO) Take by mouth.      . vitamin E 400 UNIT capsule Take 400 Units by mouth daily.      Marland Kitchen  ALPRAZolam (XANAX) 0.25 MG tablet Take 1 tablet (0.25 mg total) by mouth at bedtime as needed for anxiety.  30 tablet  0   No current facility-administered medications for this visit.    BP 132/68  Pulse 64  Ht 5\' 5"  (1.651 m)  Wt 156 lb (70.761 kg)  BMI 25.96 kg/m2  SpO2 98% General: NAD Neck: No JVD, no thyromegaly or thyroid nodule.  Lungs: Clear to auscultation bilaterally with normal respiratory effort. CV: Nondisplaced PMI.  Heart regular S1/S2, no S3/S4, 3/6 crescendo-decrescendo systolic murmur, S2 heard well.  Trace ankle edema.  No carotid bruit.  Normal pedal pulses.  Abdomen: Soft, nontender, no hepatosplenomegaly, no distention.  Skin: Intact without lesions or rashes.  Neurologic: Alert and oriented x 3.  Psych: Normal affect. Extremities: No clubbing or cyanosis.   Assessment/Plan: 1. Aortic stenosis: Bicuspid  aortic valve disorder with severe aortic stenosis on echo done this month.  She has an ectatic ascending aorta (3.8 cm) but it is not large enough that it would need repair.  She is symptomatic with new exertional dyspnea and lightheadedness.   - She is going to need valve replacement.  She is probably too health to qualify for TAVR.  I will refer her to [facility] for surgical evaluation.  - LHC/RHC next week prior to surgery.  - BMET/CBC pre-cath.  2. Hyperlipidemia: Good lipids on atorvastatin.   Loralie Champagne 05/21/2014

## 2014-05-25 NOTE — Interval H&P Note (Signed)
History and Physical Interval Note:  05/25/2014 9:40 AM  Becky Winters  has presented today for surgery, with the diagnosis of aortic stenosis  The various methods of treatment have been discussed with the patient and family. After consideration of risks, benefits and other options for treatment, the patient has consented to  Procedure(s): LEFT AND RIGHT HEART CATHETERIZATION WITH CORONARY ANGIOGRAM (N/A) as a surgical intervention .  The patient's history has been reviewed, patient examined, no change in status, stable for surgery.  I have reviewed the patient's chart and labs.  Questions were answered to the patient's satisfaction.     [name] [employer]

## 2014-05-25 NOTE — CV Procedure (Signed)
    Cardiac Catheterization Procedure Note  Name: Becky Winters MRN: [id number] DOB: 10-Jan-1941  Procedure: Right Heart Cath, Selective Coronary Angiography  Indication: Severe bicuspid aortic valve stenosis.    Procedural Details: The right radial and brachial areas were prepped, draped, and anesthetized with 1% lidocaine. There was a peripheral IV in the right brachial area that was replaced with 5 French venous sheath. A Swan-Ganz catheter was used for the right heart catheterization. Standard protocol was followed for recording of right heart pressures and sampling of oxygen saturations. Fick cardiac output was calculated. The right radial artery was entered using modified Seldinger technique and a 5 French arterial sheath was placed.  The patient received 3 mg IA verapamil and weight-based IV heparin.  Standard Judkins catheters were used for selective coronary angiography. There were no immediate procedural complications. The patient was transferred to the post catheterization recovery area for further monitoring.  Procedural Findings: Hemodynamics (mmHg) RA mean 4 RV 26/4 PA 23/8, mean 14 PCWP mean 7 AO 108/56  Oxygen saturations: PA 81% AO 97%  Cardiac Output (Fick) 7.97  Cardiac Index (Fick) 4.43   Coronary angiography: Coronary dominance: right  Left mainstem: No significant coronary disease.   Left anterior descending (LAD): No significant coronary disease.   Left circumflex (LCx): Relatively small system.  40% proximal LCx stenosis at take-off of small OM1.   Right coronary artery (RCA): No angiographic CAD.   Left ventriculography: Not done, known severe AS.   Final Conclusions:  Normal right and left heart filling pressures, normal cardiac output.  No obstructive CAD.  Patient has severe AS and has been referred for AVR.   Loralie Champagne 05/25/2014, 10:22 AM

## 2014-05-29 ENCOUNTER — Encounter: Payer: Self-pay | Admitting: Thoracic Surgery (Cardiothoracic Vascular Surgery)

## 2014-05-29 ENCOUNTER — Telehealth: Payer: Self-pay | Admitting: Cardiology

## 2014-05-29 ENCOUNTER — Other Ambulatory Visit: Payer: Self-pay

## 2014-05-29 ENCOUNTER — Institutional Professional Consult (permissible substitution) (INDEPENDENT_AMBULATORY_CARE_PROVIDER_SITE_OTHER): Payer: Medicare Other | Admitting: Thoracic Surgery (Cardiothoracic Vascular Surgery)

## 2014-05-29 VITALS — BP 134/86 | HR 61 | Ht 65.0 in | Wt 160.0 lb

## 2014-05-29 DIAGNOSIS — Q231 Congenital insufficiency of aortic valve: Secondary | ICD-10-CM | POA: Diagnosis not present

## 2014-05-29 DIAGNOSIS — I5032 Chronic diastolic (congestive) heart failure: Secondary | ICD-10-CM

## 2014-05-29 DIAGNOSIS — I350 Nonrheumatic aortic (valve) stenosis: Secondary | ICD-10-CM

## 2014-05-29 NOTE — Telephone Encounter (Signed)
New message      Talk to Becky Winters----having surgery soon and want to ask the nurse something

## 2014-05-29 NOTE — Progress Notes (Signed)
[address]       [place] [address]             [phone]     CARDIOTHORACIC SURGERY CONSULTATION REPORT  Referring Provider is Larey Dresser, MD PCP is Sheela Stack, MD  Chief Complaint  Patient presents with  . NEW CARDIAC    AORTIC STNOSIS    HPI:  Patient is a 73 year old female with history of bicuspid aortic valve and aortic stenosis who has been referred for possible elective aortic valve replacement.  The patient states that she has a long-standing history of mild exertional shortness of breath. She was noted to have a heart murmur on physical exam and diagnosed with likely bicuspid aortic valve and mild aortic stenosis in 2012.  The patient states that over the past 2-3 months she has developed worsening symptoms of exertional shortness of breath with occasional episodes of transient dizzy spells or lightheadedness with occasional tachypalpitations. Symptoms have progressed such that now the patient gets short of breath with relatively mild activity.  She has had increasing dizzy spells without syncope. She has intermittently measured her pulse and blood pressure, and at times she gets dizzy her blood pressure typically runs low with systolic pressure in the 11-941 range. She was seen in followup by Dr. Aundra Dubin and transthoracic echocardiogram demonstrated progression of severity of aortic stenosis with peak velocity measured across the aortic valve approximately 4.1 m/s corresponding to a mean transvalvular gradient of 68 and 37 mm mercury, respectively.  The calculated aortic valve area arranged between 0.6 and 0.7 cm. Left ventricular systolic function remained normal with ejection fraction estimated 60-65%.  MRA of the chest revealed mild ectasia of the ascending thoracic aorta with maximum transverse diameter 3.7-3.8 cm.  Left and right heart catheterization revealed nonobstructive coronary artery disease with normal right-sided filling  pressures. The patient was referred for surgical consultation.  The patient is married and lives locally in [place] with her husband.  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  She has remained relatively active physically and completely functionally independent for all of her life.  She has a long history of mild exertional shortness of breath, but symptoms have progressed substantially over the past 2-3 months. She now get short of breath with moderate exertion and this has began to limit her physical activity. She reports occasional fleeting episodes of atypical chest discomfort, but she denies any exertional chest pain or chest tightness. She has occasional palpitations and dizzy spells without syncope. She has had some lower extremity edema.  She denies any history of PND, orthopnea, or resting shortness of breath.   Past Medical History  Diagnosis Date  . Collagenous colitis   . TIA (transient ischemic attack)   . Hypothyroidism   . HLD (hyperlipidemia)   . H/O bicuspid aortic valve   . Aortic stenosis   . Severe aortic stenosis 05/21/2014  . Orthostatic hypotension 05/10/2014  . Bicuspid aortic valve 05/10/2014  . Chronic diastolic congestive heart failure   . Exertional dyspnea 05/10/2014    Past Surgical History  Procedure Laterality Date  . Cardiolite  2003, 2006  . Esophageal dilation    . Total abdominal hysterectomy    . Appendectomy    . Cholecystectomy      Family History  Problem Relation Age of Onset  . CAD Father 64  . CVA  History   Social History  . Marital Status: Married    Spouse Name: N/A    Number of Children: N/A  . Years of Education: N/A   Occupational History  . Not on file.   Social History Main Topics  . Smoking status: Never Smoker   . Smokeless tobacco: Not on file  . Alcohol Use: Not on file  . Drug Use: Not on file  . Sexual Activity: Not on file    Other Topics Concern  . Not on file   Social History Narrative  . No narrative on file    Current Outpatient Prescriptions  Medication Sig Dispense Refill  . ALPRAZolam (XANAX) 0.25 MG tablet Take 1 tablet (0.25 mg total) by mouth at bedtime as needed for anxiety.  30 tablet  0  . atorvastatin (LIPITOR) 40 MG tablet Take 20 mg by mouth at bedtime.       Marland Kitchen b complex vitamins capsule Take 1 capsule by mouth daily.      . beta carotene w/minerals (OCUVITE) tablet Take 1 tablet by mouth daily.      . Cholecalciferol (VITAMIN D3) 1000 UNITS CAPS Take 1,000 Units by mouth daily.       . Cinnamon 500 MG TABS Take 500 mg by mouth daily.       Marland Kitchen co-enzyme Q-10 50 MG capsule Take 100 mg by mouth 2 (two) times daily.      Marland Kitchen doxylamine, Sleep, (SLEEP AID) 25 MG tablet Take 50 mg by mouth at bedtime as needed.      . folic acid (FOLVITE) 161 MCG tablet Take 400 mcg by mouth daily.      Marland Kitchen levothyroxine (SYNTHROID, LEVOTHROID) 125 MCG tablet Take 125 mcg by mouth daily. Not on sundays      . Melatonin 3 MG TABS Take 3 tablets by mouth daily.      . Multiple Vitamins-Minerals (HAIR/SKIN/NAILS PO) Take 1 tablet by mouth daily.       . Multiple Vitamins-Minerals (MULTIVITAMIN WITH MINERALS) tablet Take 1 tablet by mouth daily.      Jonna Coup Leaf Extract 500 MG CAPS Take 500 mg by mouth daily.       . Probiotic Product (ALIGN PO) Take 1 tablet by mouth daily.       Marland Kitchen loperamide (IMODIUM) 2 MG capsule Take 2 mg by mouth as needed for diarrhea or loose stools.       No current facility-administered medications for this visit.    Allergies  Allergen Reactions  . Asacol [Mesalamine] Other (See Comments)    Severe diarrhea   . Codeine Nausea And Vomiting  . Pentasa [Mesalamine Er] Other (See Comments)    Severe diarrhea   . Amoxicillin Rash  . Clindamycin/Lincomycin Itching and Rash  . Sulfur Rash      Review of Systems:   General:  normal appetite, decreased energy, no weight gain, no weight  loss, no fever  Cardiac:  no chest pain with exertion, no chest pain at rest, + SOB with exertion, no resting SOB, no PND, no orthopnea, + palpitations, no arrhythmia, no atrial fibrillation, + LE edema, + dizzy spells, no syncope  Respiratory:  + exertional shortness of breath, no home oxygen, no productive cough, + intermittent chronic dry cough, no bronchitis, no wheezing, no hemoptysis, no asthma, no pain with inspiration or cough, no sleep apnea, no CPAP at night  GI:   + some difficulty swallowing, + chronic reflux, no frequent heartburn, + hiatal  hernia, no abdominal pain, no constipation, + chronic diarrhea, + occasional hematochezia, no hematemesis, no melena  GU:   no dysuria,  no frequency, no urinary tract infection, no hematuria, no kidney stones, no kidney disease  Vascular:  no pain suggestive of claudication, no pain in feet, no leg cramps, no varicose veins, no DVT, no non-healing foot ulcer  Neuro:   no stroke, ? TIA's, no seizures, + headaches in the past, + temporary blindness one eye - episodes associated w/ migraine headaches in the past - none recently,  no slurred speech, no peripheral neuropathy, no chronic pain, no instability of gait, no memory/cognitive dysfunction  Musculoskeletal: + arthritis, no joint swelling, no myalgias, no difficulty walking, normal mobility   Skin:   no rash, no itching, no skin infections, no pressure sores or ulcerations  Psych:   + anxiety, no depression, + nervousness, no unusual recent stress  Eyes:   no blurry vision, no floaters, no recent vision changes, + wears glasses or contacts  ENT:   no hearing loss, no loose or painful teeth, no dentures, last saw dentist 05/2013  Hematologic:  no easy bruising, no abnormal bleeding, no clotting disorder, no frequent epistaxis  Endocrine:  no diabetes, does not check CBG's at home     Physical Exam:   BP 134/86  Pulse 61  Ht 5\' 5"  (1.651 m)  Wt 160 lb (72.576 kg)  BMI 26.63 kg/m2  SpO2  98%  General:    well-appearing  HEENT:  Unremarkable   Neck:   no JVD, no bruits, no adenopathy   Chest:   clear to auscultation, symmetrical breath sounds, no wheezes, no rhonchi  CV:   RRR, grade III/VI late-peaking harsh systolic murmur   Abdomen:  soft, non-tender, no masses   Extremities:  warm, well-perfused, pulses palpable, no LE edema  Rectal/GU  Deferred  Neuro:   Grossly non-focal and symmetrical throughout  Skin:   Clean and dry, no rashes, no breakdown   Diagnostic Tests:  Transthoracic Echocardiography  Patient: Becky Winters, Becky Winters MR #: [id number] Study Date: 05/16/2014 Gender: F Age: 47 Height: 165.1 cm Weight: 73 kg BSA: 1.85 m^2 Pt. Status: Room:  SONOGRAPHER Blondell Reveal ATTENDING Loralie Champagne, M.D. ORDERING Loralie Champagne, M.D. REFERRING Loralie Champagne, M.D. PERFORMING [facility], Outpatient  cc:  ------------------------------------------------------------------- LV EF: 60% - 65%  ------------------------------------------------------------------- Indications: Biscuspid aortic valve 746.4.  ------------------------------------------------------------------- History: PMH: AS. Acquired from the patient and from the patient&'s chart. Dyspnea. Risk factors: Dyslipidemia.  ------------------------------------------------------------------- Study Conclusions  - Left ventricle: The cavity size was normal. There was mild concentric hypertrophy. Systolic function was normal. The estimated ejection fraction was in the range of 60% to 65%. Wall motion was normal; there were no regional wall motion abnormalities. Doppler parameters are consistent with abnormal left ventricular relaxation (grade 1 diastolic dysfunction). The E/e&' ratio is between 8-15, suggesting indeterminate LV filling pressure. - Aortic valve: Heavy valve calcification and reduced mobility of the fused non-coronary/right coronary cusps, probably bicuspid valve. There is severe aortic  stenosis. Peak and mean gradients of 68 mmHG and 37 mmHG respectively. Based on an LVOT diameter of 2.1 cm, the calculated AVA is 0.6-0.7 cm2. There was trivial regurgitation. - Mitral valve: Mildly thickened leaflets . There was trivial regurgitation. - Tricuspid valve: There was mild regurgitation. - Pulmonary arteries: PA peak pressure: 27 mm Hg (S).  Impressions:  - Compared to the prior echo in 2012, there is now severe aortic stenosis.  Transthoracic echocardiography. M-mode, complete 2D,  spectral Doppler, and color Doppler. Birthdate: Patient birthdate: [birth year]. Age: Patient is 73 yr old. Sex: Gender: female. BMI: 26.8 kg/m^2. Blood pressure: 118/80 Patient status: Outpatient. Study date: Study date: 05/16/2014. Study time: 08:57 AM. Location: [facility]  -------------------------------------------------------------------  ------------------------------------------------------------------- Left ventricle: The cavity size was normal. There was mild concentric hypertrophy. Systolic function was normal. The estimated ejection fraction was in the range of 60% to 65%. Wall motion was normal; there were no regional wall motion abnormalities. Doppler parameters are consistent with abnormal left ventricular relaxation (grade 1 diastolic dysfunction). The E/e&' ratio is between 8-15, suggesting indeterminate LV filling pressure.  ------------------------------------------------------------------- Aortic valve: Heavy valve calcification and reduced mobility of the fused non-coronary/right coronary cusps, probably bicuspid valve. There is severe aortic stenosis. Peak and mean gradients of 68 mmHG and 37 mmHG respectively. Based on an LVOT diameter of 2.1 cm, the calculated AVA is 0.6-0.7 cm2. Doppler: There was trivial regurgitation. VTI ratio of LVOT to aortic valve: 0.2. Valve area (VTI): 0.67 cm^2. Indexed valve area (VTI): 0.36 cm^2/m^2. Peak velocity ratio of LVOT  to aortic valve: 0.21. Valve area (Vmax): 0.74 cm^2. Indexed valve area (Vmax): 0.4 cm^2/m^2. Mean velocity ratio of LVOT to aortic valve: 0.2. Valve area (Vmean): 0.71 cm^2. Indexed valve area (Vmean): 0.38 cm^2/m^2. Mean gradient (S): 37 mm Hg. Peak gradient (S): 68 mm Hg.  ------------------------------------------------------------------- Aorta: Aortic root: The aortic root was normal in size. Ascending aorta: The ascending aorta was normal in size.  ------------------------------------------------------------------- Mitral valve: Mildly thickened leaflets . Doppler: There was trivial regurgitation. Peak gradient (D): 2 mm Hg.  ------------------------------------------------------------------- Left atrium: The atrium was normal in size.  ------------------------------------------------------------------- Right ventricle: The cavity size was normal. Wall thickness was normal. Systolic function was normal.  ------------------------------------------------------------------- Pulmonic valve: The valve appears to be grossly normal. Doppler: There was trivial regurgitation.  ------------------------------------------------------------------- Tricuspid valve: Doppler: There was mild regurgitation.  ------------------------------------------------------------------- Pulmonary artery: The main pulmonary artery was normal-sized.  ------------------------------------------------------------------- Right atrium: The atrium was normal in size.  ------------------------------------------------------------------- Pericardium: There was no pericardial effusion.  ------------------------------------------------------------------- Systemic veins: Inferior vena cava: The vessel was normal in size. The respirophasic diameter changes were in the normal range (>= 50%), consistent with normal central venous  pressure.  ------------------------------------------------------------------- Measurements  Left ventricle Value Reference LV ID, ED, PLAX chordal 44.8 mm 43 - 52 LV ID, ES, PLAX chordal 28.8 mm 23 - 38 LV fx shortening, PLAX chordal 36 % >=29 LV PW thickness, ED 10.6 mm --------- IVS/LV PW ratio, ED 1.12 <=1.3 Stroke volume, 2D 70 ml --------- Stroke volume/bsa, 2D 38 ml/m^2 --------- LV e&', lateral 6.24 cm/s --------- LV E/e&', lateral 12.26 --------- LV e&', medial 6.73 cm/s --------- LV E/e&', medial 11.37 --------- LV e&', average 6.49 cm/s --------- LV E/e&', average 11.8 ---------  Ventricular septum Value Reference IVS thickness, ED 11.9 mm ---------  LVOT Value Reference LVOT ID, S 21 mm --------- LVOT area 3.46 cm^2 --------- LVOT ID 21 mm --------- LVOT peak velocity, S 88 cm/s --------- LVOT mean velocity, S 58.5 cm/s --------- LVOT VTI, S 20.2 cm --------- Stroke volume (SV), LVOT DP 70 ml --------- Stroke index (SV/bsa), LVOT DP 37.9 ml/m^2 ---------  Aortic valve Value Reference Aortic valve peak velocity, S 412 cm/s --------- Aortic valve mean velocity, S 286 cm/s --------- Aortic valve VTI, S 103 cm --------- Aortic mean gradient, S 37 mm Hg --------- Aortic peak gradient, S 68 mm Hg --------- VTI ratio, LVOT/AV 0.2 --------- Aortic valve area, VTI 0.67  cm^2 --------- Aortic valve area/bsa, VTI 0.36 cm^2/m^2 --------- Velocity ratio, peak, LVOT/AV 0.21 --------- Aortic valve area, peak velocity 0.74 cm^2 --------- Aortic valve area/bsa, peak 0.4 cm^2/m^2 --------- velocity Velocity ratio, mean, LVOT/AV 0.2 --------- Aortic valve area, mean velocity 0.71 cm^2 --------- Aortic valve area/bsa, mean 0.38 cm^2/m^2 --------- velocity  Aorta Value Reference Aortic root ID, ED 32 mm --------- Ascending aorta ID, A-P, S 37 mm ---------  Left atrium Value Reference LA ID, A-P, ES 41 mm --------- LA ID/bsa, A-P (H) 2.22 cm/m^2 <=2.2 LA volume, ES, 1-p  A4C 50 ml --------- LA volume/bsa, ES, 1-p A4C 27.1 ml/m^2 --------- LA volume, ES, 1-p A2C 48 ml --------- LA volume/bsa, ES, 1-p A2C 26 ml/m^2 ---------  Mitral valve Value Reference Mitral E-wave peak velocity 76.5 cm/s --------- Mitral A-wave peak velocity 88.4 cm/s --------- Mitral deceleration time (H) 437 ms 150 - 230 Mitral peak gradient, D 2 mm Hg --------- Mitral E/A ratio, peak 0.9 ---------  Pulmonary arteries Value Reference PA pressure, S, DP 27 mm Hg <=30  Tricuspid valve Value Reference Tricuspid regurg peak velocity 246 cm/s --------- Tricuspid peak RV-RA gradient 24 mm Hg --------- Tricuspid maximal regurg 246 cm/s --------- velocity, PISA  Systemic veins Value Reference Estimated CVP 3 mm Hg ---------  Right ventricle Value Reference RV pressure, S, DP 27 mm Hg <=30 RV s&', lateral, S 13.3 cm/s ---------  Legend: (L) and (H) mark values outside specified reference range.  ------------------------------------------------------------------- Prepared and Electronically Authenticated by  Lyman Bishop MD 2015-09-22T10:52:31   MRA CHEST WITH OR WITHOUT CONTRAST  TECHNIQUE:  Angiographic images of the chest were obtained using MRA technique  without and with intravenous contrast.  CONTRAST: 51mL MULTIHANCE GADOBENATE DIMEGLUMINE 529 MG/ML IV SOLN  COMPARISON: Prior CT abdomen/pelvis 05/15/2010  FINDINGS:  VASCULAR  Aorta: Ectasia of the tubular portion of the ascending thoracic  aorta with a maximal diameter of 3.7- 3.8 cm. The aortic root is  within normal limits at 3.3 cm. No effacement of the sino-tubular  junction. The transverse and descending thoracic aorta are within  normal limits. There is a mild ductus bump in the proximal  descending thoracic aorta. Conventional 3 vessel arch anatomy. No  significant stenosis.  Heart: Functionally bicuspid aortic valve. The right and left  coronary cusp moieties are fused with an intervening median raphe   consistent with a type 1 functionally bicuspid valve. The coronaries  sinuses remain relatively symmetric in size. The non coronary cusp  remains independent. The valve itself is thickened with signal  artifact suggesting the presence of calcification. High velocity  jetting is noted on the cardiac gated images consistent with aortic  stenosis. The heart itself is within normal limits for size. No  pericardial effusion.  Great Vessels: Conventional 3 vessel arch anatomy. No significant  stenosis. Normal caliber pulmonary artery. Pulmonary veins are also  unremarkable within normal drainage pattern.  NON VASCULAR  No signal abnormality or abnormal enhancement in the soft tissues,  musculoskeletal structures, lungs or visualized upper abdominal  organs.  IMPRESSION:  VASCULAR  1. Thickened and likely calcified aortic valve with evidence of  aortic stenosis. Fusion of the right and left coronary cusps with an  intervening median raphe and independent non coronary cusp  consistent with Type 1 functionally bicuspid valve anatomy. The  sinuses of Valsalva remain symmetric in size and appearance.  2. Ectasia of the tubular portion of the ascending thoracic aorta  with a maximal diameter of 3.7- 3.8 cm. No effacement  of the  sino-tubular junction. The aortic root remains within normal limits  with a maximal diameter of 3.3 cm at the sinuses of Valsalva.  Recommend annual imaging followup by CTA or MRA. This recommendation  follows 2010 ACCF/AHA/AATS/ACR/ASA/SCA/SCAI/SIR/STS/SVM Guidelines  for the Diagnosis and Management of Patients With Thoracic Aortic  Disease. Circulation. 2010; 121: O294-T654  3. Small ductus bump noted incidentally along the anterior aspect of  the proximal descending thoracic aorta.  NON VASCULAR  1. No focal signal abnormality or abnormal enhancement.  Signed,  Criselda Peaches, MD  [facility]  [facility]   Electronically Signed  By: Jacqulynn Cadet M.D.  On: 05/19/2014 09:27    Cardiac Catheterization Procedure Note  Name: Becky Winters  MRN: [id number]  DOB: [birth year]  Procedure: Right Heart Cath, Selective Coronary Angiography  Indication: Severe bicuspid aortic valve stenosis.  Procedural Details: The right radial and brachial areas were prepped, draped, and anesthetized with 1% lidocaine. There was a peripheral IV in the right brachial area that was replaced with 5 French venous sheath. A Swan-Ganz catheter was used for the right heart catheterization. Standard protocol was followed for recording of right heart pressures and sampling of oxygen saturations. Fick cardiac output was calculated. The right radial artery was entered using modified Seldinger technique and a 5 French arterial sheath was placed. The patient received 3 mg IA verapamil and weight-based IV heparin. Standard Judkins catheters were used for selective coronary angiography. There were no immediate procedural complications. The patient was transferred to the post catheterization recovery area for further monitoring.  Procedural Findings:  Hemodynamics (mmHg)  RA mean 4  RV 26/4  PA 23/8, mean 14  PCWP mean 7  AO 108/56  Oxygen saturations:  PA 81%  AO 97%  Cardiac Output (Fick) 7.97  Cardiac Index (Fick) 4.43  Coronary angiography:  Coronary dominance: right  Left mainstem: No significant coronary disease.  Left anterior descending (LAD): No significant coronary disease.  Left circumflex (LCx): Relatively small system. 40% proximal LCx stenosis at take-off of small OM1.  Right coronary artery (RCA): No angiographic CAD.  Left ventriculography: Not done, known severe AS.  Final Conclusions: Normal right and left heart filling pressures, normal cardiac output. No obstructive CAD. Patient has severe AS and has been referred for AVR.  Loralie Champagne  05/25/2014, 10:22 AM    STS Risk  Calculator  Procedure    AVR  Risk of Mortality   1.8% Morbidity or Mortality  13.9% Prolonged LOS   4.8% Short LOS    37.3% Permanent Stroke   1.7% Prolonged Vent Support  9.3% DSW Infection    0.2% Renal Failure    3.1% Reoperation    6.1%   Impression:  Patient has bicuspid native aortic valve with stage D severe symptomatic aortic stenosis.  Left ventricular systolic function remains normal. There is mild ectasia and enlargement of the ascending thoracic aorta with maximum transverse diameter approximately 3.7-3.8 cm. I agree the patient would best be treated with elective aortic valve replacement.    Plan:  The patient was counseled at length regarding surgical alternatives with respect to aortic valve replacement including continued medical therapy versus proceeding with conventional surgical aortic valve replacement using either a mechanical prosthesis or a bioprosthetic tissue valve.  Other alternatives including stentless bioprosthetic tissue valve replacement, valve repair, and transcatheter aortic valve replacement were discussed.  Discussion was held comparing the relative risks of mechanical valve replacement with need for lifelong anticoagulation versus  use of a bioprosthetic tissue valve and the associated potential for late structural valve deterioration in failure.  This discussion was placed in the context of the patient's particular circumstances, and as a result the patient specifically requests that their valve be replaced using a bioprosthetic tissue valve .  The patient understands and accepts all potential associated risks of surgery including but not limited to risk of death, stroke, myocardial infarction, congestive heart failure, respiratory failure, renal failure, pneumonia, bleeding requiring blood transfusion and or reexploration, arrhythmia, heart block or bradycardia requiring permanent pacemaker, aortic dissection or other major vascular complication, pleural  effusions or other delayed complications related to continued congestive heart failure, and other late complications related to valve replacement including structural valve deterioration and failure, thrombosis, endocarditis, or paravalvular leak.  We plan to proceed with surgery on Wednesday, 06/07/2014.   I spent in excess of 90 minutes during the conduct of this office consultation and >50% of this time involved direct face-to-face encounter with the patient for counseling and/or coordination of their care.   Valentina Gu. Roxy Manns, MD 05/29/2014 2:20 PM

## 2014-05-29 NOTE — Patient Instructions (Addendum)
Nothing to eat or drink after midnight the night before surgery  On the morning of surgery take only your Syntroid (thyroid hormone) with a sip of water.  Do not take any other medications on the morning of surgery.

## 2014-05-29 NOTE — Telephone Encounter (Signed)
Pt states she is having surgery 06/07/14. Per Dr Vassie Moment does not need to see pt prior to surgery. OK to cancel appt 06/09/14 with him. Pt advised.

## 2014-05-30 ENCOUNTER — Encounter (HOSPITAL_COMMUNITY): Payer: Self-pay | Admitting: Pharmacy Technician

## 2014-06-02 NOTE — Pre-Procedure Instructions (Signed)
REBECKAH Winters  06/02/2014   Your procedure is scheduled on: Wednesday, June 07, 2014  Report to [facility] Admitting at 6:30 AM.  Call this number if you have problems the morning of surgery: [phone]   Remember: [place] and cardiac teaching packet to the hospital on day of  admission    Do not eat food or drink liquids after midnight Tuesday, June 06, 2014   Take these medicines the morning of surgery with A SIP OF WATER: levothyroxine (SYNTHROID), Xanax  Stop taking vitamins and herbal medications. Do not take any NSAIDs ie: Ibuprofen, Advil, Naproxen or any medication containing Aspirin.   Do not wear jewelry, make-up or nail polish.  Do not wear lotions, powders, or perfumes. You may not wear deodorant.  Do not shave 48 hours prior to surgery.  Do not bring valuables to the hospital.  [facility] is not responsible for any belongings or valuables.               Contacts, dentures or bridgework may not be worn into surgery.  Leave suitcase in the car. After surgery it may be brought to your room.  For patients admitted to the hospital, discharge time is determined by your treatment team.               Patients discharged the day of surgery will not be allowed to drive home.  Name and phone number of your driver: with family  Special Instructions:  Special Instructions:Special Instructions: [place] - Preparing for Surgery  Before surgery, you can play an important role.  Because skin is not sterile, your skin needs to be as free of germs as possible.  You can reduce the number of germs on you skin by washing with CHG (chlorahexidine gluconate) soap before surgery.  CHG is an antiseptic cleaner which kills germs and bonds with the skin to continue killing germs even after washing.  Please DO NOT use if you have an allergy to CHG or antibacterial soaps.  If your skin becomes reddened/irritated stop using the CHG and inform your nurse when  you arrive at Short Stay.  Do not shave (including legs and underarms) for at least 48 hours prior to the first CHG shower.  You may shave your face.  Please follow these instructions carefully:   1.  Shower with CHG Soap the night before surgery and the morning of Surgery.  2.  If you choose to wash your hair, wash your hair first as usual with your normal shampoo.  3.  After you shampoo, rinse your hair and body thoroughly to remove the Shampoo.  4.  Use CHG as you would any other liquid soap.  You can apply chg directly  to the skin and wash gently with scrungie or a clean washcloth.  5.  Apply the CHG Soap to your body ONLY FROM THE NECK DOWN.  Do not use on open wounds or open sores.  Avoid contact with your eyes, ears, mouth and genitals (private parts).  Wash genitals (private parts) with your normal soap.  6.  Wash thoroughly, paying special attention to the area where your surgery will be performed.  7.  Thoroughly rinse your body with warm water from the neck down.  8.  DO NOT shower/wash with your normal soap after using and rinsing off the CHG Soap.  9.  Pat yourself dry with a clean towel.  10.  Wear clean pajamas.            11.  Place clean sheets on your bed the night of your first shower and do not sleep with pets.  Day of Surgery  Do not apply any lotions/deodorants the morning of surgery.  Please wear clean clothes to the hospital/surgery center.   Please read over the following fact sheets that you were given: Pain Booklet, Coughing and Deep Breathing, Blood Transfusion Information, Open Heart Packet, MRSA Information and Surgical Site Infection Prevention

## 2014-06-05 ENCOUNTER — Ambulatory Visit (HOSPITAL_COMMUNITY)
Admission: RE | Admit: 2014-06-05 | Discharge: 2014-06-05 | Disposition: A | Discharge status: Home / Self Care | Payer: Medicare Other | Source: Ambulatory Visit | Attending: Thoracic Surgery (Cardiothoracic Vascular Surgery) | Admitting: Thoracic Surgery (Cardiothoracic Vascular Surgery)

## 2014-06-05 ENCOUNTER — Ambulatory Visit (HOSPITAL_COMMUNITY)
Admission: RE | Admit: 2014-06-05 | Discharge: 2014-06-05 | Disposition: A | Discharge status: Home / Self Care | Payer: Medicare Other | Source: Ambulatory Visit | Attending: Endocrinology | Admitting: Endocrinology

## 2014-06-05 ENCOUNTER — Encounter (HOSPITAL_COMMUNITY): Payer: Self-pay

## 2014-06-05 ENCOUNTER — Encounter (HOSPITAL_COMMUNITY)
Admission: RE | Admit: 2014-06-05 | Discharge: 2014-06-05 | Disposition: A | Discharge status: Home / Self Care | Payer: Medicare Other | Source: Ambulatory Visit | Attending: Thoracic Surgery (Cardiothoracic Vascular Surgery) | Admitting: Thoracic Surgery (Cardiothoracic Vascular Surgery)

## 2014-06-05 VITALS — BP 126/74 | HR 63 | Temp 98.1°F | Resp 18 | Ht 65.0 in | Wt 161.8 lb

## 2014-06-05 DIAGNOSIS — K219 Gastro-esophageal reflux disease without esophagitis: Secondary | ICD-10-CM

## 2014-06-05 DIAGNOSIS — E039 Hypothyroidism, unspecified: Secondary | ICD-10-CM | POA: Insufficient documentation

## 2014-06-05 DIAGNOSIS — J984 Other disorders of lung: Secondary | ICD-10-CM | POA: Diagnosis not present

## 2014-06-05 DIAGNOSIS — I350 Nonrheumatic aortic (valve) stenosis: Secondary | ICD-10-CM

## 2014-06-05 DIAGNOSIS — I5032 Chronic diastolic (congestive) heart failure: Secondary | ICD-10-CM

## 2014-06-05 DIAGNOSIS — E785 Hyperlipidemia, unspecified: Secondary | ICD-10-CM

## 2014-06-05 DIAGNOSIS — Z9889 Other specified postprocedural states: Secondary | ICD-10-CM | POA: Diagnosis not present

## 2014-06-05 DIAGNOSIS — I480 Paroxysmal atrial fibrillation: Secondary | ICD-10-CM | POA: Diagnosis not present

## 2014-06-05 DIAGNOSIS — Z8673 Personal history of transient ischemic attack (TIA), and cerebral infarction without residual deficits: Secondary | ICD-10-CM | POA: Diagnosis not present

## 2014-06-05 DIAGNOSIS — M159 Polyosteoarthritis, unspecified: Secondary | ICD-10-CM | POA: Diagnosis present

## 2014-06-05 DIAGNOSIS — Z01818 Encounter for other preprocedural examination: Secondary | ICD-10-CM | POA: Diagnosis not present

## 2014-06-05 DIAGNOSIS — Z882 Allergy status to sulfonamides status: Secondary | ICD-10-CM | POA: Diagnosis not present

## 2014-06-05 DIAGNOSIS — M1990 Unspecified osteoarthritis, unspecified site: Secondary | ICD-10-CM | POA: Diagnosis not present

## 2014-06-05 DIAGNOSIS — Z881 Allergy status to other antibiotic agents status: Secondary | ICD-10-CM | POA: Diagnosis not present

## 2014-06-05 DIAGNOSIS — Z954 Presence of other heart-valve replacement: Secondary | ICD-10-CM | POA: Diagnosis not present

## 2014-06-05 DIAGNOSIS — J9811 Atelectasis: Secondary | ICD-10-CM | POA: Diagnosis not present

## 2014-06-05 DIAGNOSIS — R918 Other nonspecific abnormal finding of lung field: Secondary | ICD-10-CM | POA: Diagnosis not present

## 2014-06-05 DIAGNOSIS — Z452 Encounter for adjustment and management of vascular access device: Secondary | ICD-10-CM | POA: Diagnosis not present

## 2014-06-05 DIAGNOSIS — Z01812 Encounter for preprocedural laboratory examination: Secondary | ICD-10-CM | POA: Insufficient documentation

## 2014-06-05 DIAGNOSIS — I358 Other nonrheumatic aortic valve disorders: Secondary | ICD-10-CM | POA: Diagnosis not present

## 2014-06-05 DIAGNOSIS — Z01810 Encounter for preprocedural cardiovascular examination: Secondary | ICD-10-CM | POA: Insufficient documentation

## 2014-06-05 DIAGNOSIS — Z889 Allergy status to unspecified drugs, medicaments and biological substances status: Secondary | ICD-10-CM | POA: Diagnosis not present

## 2014-06-05 DIAGNOSIS — F419 Anxiety disorder, unspecified: Secondary | ICD-10-CM | POA: Diagnosis present

## 2014-06-05 DIAGNOSIS — Z951 Presence of aortocoronary bypass graft: Secondary | ICD-10-CM | POA: Diagnosis not present

## 2014-06-05 DIAGNOSIS — J90 Pleural effusion, not elsewhere classified: Secondary | ICD-10-CM | POA: Diagnosis not present

## 2014-06-05 DIAGNOSIS — Z8249 Family history of ischemic heart disease and other diseases of the circulatory system: Secondary | ICD-10-CM | POA: Diagnosis not present

## 2014-06-05 DIAGNOSIS — D62 Acute posthemorrhagic anemia: Secondary | ICD-10-CM | POA: Diagnosis not present

## 2014-06-05 DIAGNOSIS — Z885 Allergy status to narcotic agent status: Secondary | ICD-10-CM | POA: Diagnosis not present

## 2014-06-05 HISTORY — DX: Gastro-esophageal reflux disease without esophagitis: K21.9

## 2014-06-05 HISTORY — DX: Personal history of other diseases of the digestive system: Z87.19

## 2014-06-05 HISTORY — DX: Other specified soft tissue disorders: M79.89

## 2014-06-05 HISTORY — DX: Urinary tract infection, site not specified: N39.0

## 2014-06-05 HISTORY — DX: Unspecified osteoarthritis, unspecified site: M19.90

## 2014-06-05 HISTORY — DX: Esophageal obstruction: K22.2

## 2014-06-05 HISTORY — DX: Anxiety disorder, unspecified: F41.9

## 2014-06-05 LAB — URINALYSIS, ROUTINE W REFLEX MICROSCOPIC
Bilirubin Urine: NEGATIVE
Glucose, UA: NEGATIVE mg/dL
HGB URINE DIPSTICK: NEGATIVE
Ketones, ur: NEGATIVE mg/dL
NITRITE: NEGATIVE
PH: 6.5 (ref 5.0–8.0)
Protein, ur: NEGATIVE mg/dL
SPECIFIC GRAVITY, URINE: 1.006 (ref 1.005–1.030)
Urobilinogen, UA: 0.2 mg/dL (ref 0.0–1.0)

## 2014-06-05 LAB — PULMONARY FUNCTION TEST
DL/VA % pred: 81 %
DL/VA: 4 ml/min/mmHg/L
DLCO COR: 18.43 ml/min/mmHg
DLCO UNC % PRED: 71 %
DLCO cor % pred: 71 %
DLCO unc: 18.43 ml/min/mmHg
FEF 25-75 PRE: 2.27 L/s
FEF 25-75 Post: 2.42 L/sec
FEF2575-%Change-Post: 6 %
FEF2575-%PRED-PRE: 119 %
FEF2575-%Pred-Post: 126 %
FEV1-%Change-Post: 3 %
FEV1-%PRED-PRE: 105 %
FEV1-%Pred-Post: 109 %
FEV1-PRE: 2.45 L
FEV1-Post: 2.54 L
FEV1FVC-%Change-Post: 6 %
FEV1FVC-%PRED-PRE: 100 %
FEV6-%CHANGE-POST: -2 %
FEV6-%PRED-POST: 105 %
FEV6-%Pred-Pre: 107 %
FEV6-PRE: 3.19 L
FEV6-Post: 3.1 L
FEV6FVC-%Change-Post: 0 %
FEV6FVC-%Pred-Post: 104 %
FEV6FVC-%Pred-Pre: 103 %
FVC-%Change-Post: -2 %
FVC-%PRED-POST: 101 %
FVC-%PRED-PRE: 104 %
FVC-POST: 3.12 L
FVC-Pre: 3.2 L
POST FEV6/FVC RATIO: 100 %
Post FEV1/FVC ratio: 82 %
Pre FEV1/FVC ratio: 76 %
Pre FEV6/FVC Ratio: 99 %
RV % pred: 91 %
RV: 2.07 L
TLC % PRED: 101 %
TLC: 5.29 L

## 2014-06-05 LAB — URINE MICROSCOPIC-ADD ON

## 2014-06-05 LAB — CBC
HEMATOCRIT: 41.9 % (ref 36.0–46.0)
Hemoglobin: 14.2 g/dL (ref 12.0–15.0)
MCH: 28.2 pg (ref 26.0–34.0)
MCHC: 33.9 g/dL (ref 30.0–36.0)
MCV: 83.3 fL (ref 78.0–100.0)
Platelets: 275 10*3/uL (ref 150–400)
RBC: 5.03 MIL/uL (ref 3.87–5.11)
RDW: 15.1 % (ref 11.5–15.5)
WBC: 8.7 10*3/uL (ref 4.0–10.5)

## 2014-06-05 LAB — BLOOD GAS, ARTERIAL
Acid-base deficit: 3.4 mmol/L — ABNORMAL HIGH (ref 0.0–2.0)
Bicarbonate: 20.1 mEq/L (ref 20.0–24.0)
Drawn by: 206361
FIO2: 0.21 %
O2 SAT: 97.3 %
PH ART: 7.436 (ref 7.350–7.450)
Patient temperature: 98.6
TCO2: 21.1 mmol/L (ref 0–100)
pCO2 arterial: 30.4 mmHg — ABNORMAL LOW (ref 35.0–45.0)
pO2, Arterial: 87.7 mmHg (ref 80.0–100.0)

## 2014-06-05 LAB — COMPREHENSIVE METABOLIC PANEL
ALK PHOS: 69 U/L (ref 39–117)
ALT: 11 U/L (ref 0–35)
ANION GAP: 16 — AB (ref 5–15)
AST: 15 U/L (ref 0–37)
Albumin: 3.6 g/dL (ref 3.5–5.2)
BUN: 15 mg/dL (ref 6–23)
CALCIUM: 9.4 mg/dL (ref 8.4–10.5)
CO2: 19 mEq/L (ref 19–32)
CREATININE: 0.77 mg/dL (ref 0.50–1.10)
Chloride: 103 mEq/L (ref 96–112)
GFR calc non Af Amer: 83 mL/min — ABNORMAL LOW (ref >90)
GLUCOSE: 106 mg/dL — AB (ref 70–99)
POTASSIUM: 4.4 meq/L (ref 3.7–5.3)
Sodium: 138 mEq/L (ref 137–147)
TOTAL PROTEIN: 7.5 g/dL (ref 6.0–8.3)
Total Bilirubin: 0.4 mg/dL (ref 0.3–1.2)

## 2014-06-05 LAB — TYPE AND SCREEN
ABO/RH(D): O NEG
Antibody Screen: NEGATIVE

## 2014-06-05 LAB — ABO/RH: ABO/RH(D): O NEG

## 2014-06-05 LAB — HEMOGLOBIN A1C
HEMOGLOBIN A1C: 5.8 % — AB (ref <5.7)
MEAN PLASMA GLUCOSE: 120 mg/dL — AB (ref <117)

## 2014-06-05 LAB — PROTIME-INR
INR: 1.06 (ref 0.00–1.49)
Prothrombin Time: 13.8 seconds (ref 11.6–15.2)

## 2014-06-05 LAB — SURGICAL PCR SCREEN
MRSA, PCR: NEGATIVE
STAPHYLOCOCCUS AUREUS: NEGATIVE

## 2014-06-05 LAB — APTT: aPTT: 117 seconds — ABNORMAL HIGH (ref 24–37)

## 2014-06-05 MED ORDER — ALBUTEROL SULFATE (2.5 MG/3ML) 0.083% IN NEBU
2.5000 mg | INHALATION_SOLUTION | Freq: Once | RESPIRATORY_TRACT | Status: AC
  Administered 2014-06-05: 2.5 mg via RESPIRATORY_TRACT

## 2014-06-05 NOTE — H&P (Addendum)
[address]       [place] [address]             [phone]          CARDIOTHORACIC SURGERY HISTORY AND PHYSICAL EXAM  Referring Provider is Larey Dresser, MD PCP is Sheela Stack, MD    Chief Complaint   Patient presents with   .  NEW CARDIAC       AORTIC STNOSIS     HPI:  Patient is a 73 year old female with history of bicuspid aortic valve and aortic stenosis who has been referred for possible elective aortic valve replacement.  The patient states that she has a long-standing history of mild exertional shortness of breath. She was noted to have a heart murmur on physical exam and diagnosed with likely bicuspid aortic valve and mild aortic stenosis in 2012.  The patient states that over the past 2-3 months she has developed worsening symptoms of exertional shortness of breath with occasional episodes of transient dizzy spells or lightheadedness with occasional tachypalpitations. Symptoms have progressed such that now the patient gets short of breath with relatively mild activity.  She has had increasing dizzy spells without syncope. She has intermittently measured her pulse and blood pressure, and at times she gets dizzy her blood pressure typically runs low with systolic pressure in the 58-099 range. She was seen in followup by Dr. Aundra Dubin and transthoracic echocardiogram demonstrated progression of severity of aortic stenosis with peak velocity measured across the aortic valve approximately 4.1 m/s corresponding to a mean transvalvular gradient of 68 and 37 mm mercury, respectively.  The calculated aortic valve area arranged between 0.6 and 0.7 cm. Left ventricular systolic function remained normal with ejection fraction estimated 60-65%.  MRA of the chest revealed mild ectasia of the ascending thoracic aorta with maximum transverse diameter 3.7-3.8 cm.  Left and right heart catheterization revealed nonobstructive coronary artery disease with normal  right-sided filling pressures. The patient was referred for surgical consultation.  The patient is married and lives locally in [place] with her husband.  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  She has remained relatively active physically and completely functionally independent for all of her life.  She has a long history of mild exertional shortness of breath, but symptoms have progressed substantially over the past 2-3 months. She now get short of breath with moderate exertion and this has began to limit her physical activity. She reports occasional fleeting episodes of atypical chest discomfort, but she denies any exertional chest pain or chest tightness. She has occasional palpitations and dizzy spells without syncope. She has had some lower extremity edema.  She denies any history of PND, orthopnea, or resting shortness of breath.   Past Medical History  Diagnosis Date  . Collagenous colitis   . TIA (transient ischemic attack)   . Hypothyroidism   . HLD (hyperlipidemia)   . H/O bicuspid aortic valve   . Aortic stenosis   . Severe aortic stenosis 05/21/2014  . Orthostatic hypotension 05/10/2014  . Bicuspid aortic valve 05/10/2014  . Chronic diastolic congestive heart failure   . Exertional dyspnea 05/10/2014  . Heart murmur   . Anxiety     conditional, taking in preparation for surgery   . GERD (gastroesophageal reflux disease)     prevacid on occasion  . Compression, esophagus 2013    stretched in the past   .  Foot swelling     - LEFT-ever since she had an injury as a child   . H/O hiatal hernia   . UTI (urinary tract infection)     frequent UTI-----pt. reports that she takes Cipro if needed, last UTI- July 2015  . 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 Cardiolite  2003, 2006  . Esophageal  dilation    . Appendectomy    . Cholecystectomy    . Total abdominal hysterectomy      PARTIAL  . Tonsillectomy    . Nasal septum surgery  1995  . Bladder surgery      x2 for tacking  . Thumb arthroscopy Left 2006    ? replacement of ligament   . Cardiac catheterization      Family History  Problem Relation Age of Onset  . CAD Father 17  . CVA      Social History History  Substance Use Topics  . Smoking status: Never Smoker   . Smokeless tobacco: Not on file  . Alcohol Use: No    Prior to Admission medications   Medication Sig Start Date End Date Taking? Authorizing Provider  ALPRAZolam (XANAX) 0.25 MG tablet Take 1 tablet (0.25 mg total) by mouth at bedtime as needed for anxiety. 05/19/14  Yes Larey Dresser, MD  atorvastatin (LIPITOR) 40 MG tablet Take 20 mg by mouth at bedtime.    Yes Historical Provider, MD  levothyroxine (SYNTHROID, LEVOTHROID) 125 MCG tablet Take 125 mcg by mouth daily. Not on sundays 03/21/14  Yes Historical Provider, MD  loperamide (IMODIUM) 2 MG capsule Take 2 mg by mouth as needed for diarrhea or loose stools.   Yes Historical Provider, MD  b complex vitamins capsule Take 1 capsule by mouth daily.    Historical Provider, MD  beta carotene w/minerals (OCUVITE) tablet Take 1 tablet by mouth daily.    Historical Provider, MD  Cholecalciferol (VITAMIN D3) 1000 UNITS CAPS Take 1,000 Units by mouth daily.     Historical Provider, MD  Cinnamon 500 MG TABS Take 500 mg by mouth daily.     Historical Provider, MD  co-enzyme Q-10 50 MG capsule Take 100 mg by mouth 2 (two) times daily.    Historical Provider, MD  doxylamine, Sleep, (SLEEP AID) 25 MG tablet Take 50 mg by mouth at bedtime as needed for sleep.     Historical Provider, MD  folic acid (FOLVITE) 938 MCG tablet Take 400 mcg by mouth daily.    Historical Provider, MD  Melatonin 3 MG TABS Take 1 tablet by mouth daily.     Historical Provider, MD  Multiple Vitamins-Minerals (HAIR/SKIN/NAILS PO) Take 1  tablet by mouth daily.     Historical Provider, MD  Multiple Vitamins-Minerals (MULTIVITAMIN WITH MINERALS) tablet Take 1 tablet by mouth daily.    Historical Provider, MD  Olive Leaf Extract 500 MG CAPS Take 500 mg by mouth daily.     Historical Provider, MD  Probiotic Product (ALIGN PO) Take 1 tablet by mouth daily as needed.     Historical Provider, MD  psyllium (METAMUCIL) 58.6 % powder Take 1 packet by mouth daily as needed.    Historical Provider, MD    Allergies  Allergen Reactions  . Asacol [Mesalamine] Other (See Comments)    Severe diarrhea   . Codeine Nausea And Vomiting  . Pentasa [  Mesalamine Er] Other (See Comments)    Severe diarrhea   . Amoxicillin Rash  . Clindamycin/Lincomycin Itching and Rash  . Sulfur Rash      Review of Systems:              General:                      normal appetite, decreased energy, no weight gain, no weight loss, no fever             Cardiac:                      no chest pain with exertion, no chest pain at rest, + SOB with exertion, no resting SOB, no PND, no orthopnea, + palpitations, no arrhythmia, no atrial fibrillation, + LE edema, + dizzy spells, no syncope             Respiratory:                + exertional shortness of breath, no home oxygen, no productive cough, + intermittent chronic dry cough, no bronchitis, no wheezing, no hemoptysis, no asthma, no pain with inspiration or cough, no sleep apnea, no CPAP at night             GI:                                + some difficulty swallowing, + chronic reflux, no frequent heartburn, + hiatal hernia, no abdominal pain, no constipation, + chronic diarrhea, + occasional hematochezia, no hematemesis, no melena             GU:                              no dysuria,  no frequency, no urinary tract infection, no hematuria, no kidney stones, no kidney disease             Vascular:                     no pain suggestive of claudication, no pain in feet, no leg cramps, no varicose veins, no  DVT, no non-healing foot ulcer             Neuro:                         no stroke, ? TIA's, no seizures, + headaches in the past, + temporary blindness one eye - episodes associated w/ migraine headaches in the past - none recently,  no slurred speech, no peripheral neuropathy, no chronic pain, no instability of gait, no memory/cognitive dysfunction             Musculoskeletal:         + arthritis, no joint swelling, no myalgias, no difficulty walking, normal mobility               Skin:                            no rash, no itching, no skin infections, no pressure sores or ulcerations             Psych:                         +  anxiety, no depression, + nervousness, no unusual recent stress             Eyes:                           no blurry vision, no floaters, no recent vision changes, + wears glasses or contacts             ENT:                            no hearing loss, no loose or painful teeth, no dentures, last saw dentist 05/2013             Hematologic:               no easy bruising, no abnormal bleeding, no clotting disorder, no frequent epistaxis             Endocrine:                   no diabetes, does not check CBG's at home                           Physical Exam:              BP 134/86  Pulse 61  Ht 5\' 5"  (1.651 m)  Wt 160 lb (72.576 kg)  BMI 26.63 kg/m2  SpO2 98%             General:                        well-appearing             HEENT:                       Unremarkable               Neck:                           no JVD, no bruits, no adenopathy               Chest:                         clear to auscultation, symmetrical breath sounds, no wheezes, no rhonchi             CV:                              RRR, grade III/VI late-peaking harsh systolic murmur               Abdomen:                    soft, non-tender, no masses               Extremities:                 warm, well-perfused, pulses palpable, no LE edema             Rectal/GU                    Deferred             Neuro:  Grossly non-focal and symmetrical throughout             Skin:                            Clean and dry, no rashes, no breakdown   Diagnostic Tests:  Transthoracic Echocardiography  Patient: Becky Winters, Becky Winters MR #: [id number] Study Date: 05/16/2014 Gender: F Age: 35 Height: 165.1 cm Weight: 73 kg BSA: 1.85 m^2 Pt. Status: Room:  SONOGRAPHER Blondell Reveal ATTENDING Loralie Champagne, M.D. ORDERING Loralie Champagne, M.D. REFERRING Loralie Champagne, M.D. PERFORMING [facility], Outpatient  cc:  ------------------------------------------------------------------- LV EF: 60% - 65%  ------------------------------------------------------------------- Indications: Biscuspid aortic valve 746.4.  ------------------------------------------------------------------- History: PMH: AS. Acquired from the patient and from the patient&'s chart. Dyspnea. Risk factors: Dyslipidemia.  ------------------------------------------------------------------- Study Conclusions  - Left ventricle: The cavity size was normal. There was mild concentric hypertrophy. Systolic function was normal. The estimated ejection fraction was in the range of 60% to 65%. Wall motion was normal; there were no regional wall motion abnormalities. Doppler parameters are consistent with abnormal left ventricular relaxation (grade 1 diastolic dysfunction). The E/e&' ratio is between 8-15, suggesting indeterminate LV filling pressure. - Aortic valve: Heavy valve calcification and reduced mobility of the fused non-coronary/right coronary cusps, probably bicuspid valve. There is severe aortic stenosis. Peak and mean gradients of 68 mmHG and 37 mmHG respectively. Based on an LVOT diameter of 2.1 cm, the calculated AVA is 0.6-0.7 cm2. There was trivial regurgitation. - Mitral valve: Mildly thickened leaflets . There was trivial regurgitation. - Tricuspid valve: There was mild  regurgitation. - Pulmonary arteries: PA peak pressure: 27 mm Hg (S).  Impressions:  - Compared to the prior echo in 2012, there is now severe aortic stenosis.  Transthoracic echocardiography. M-mode, complete 2D, spectral Doppler, and color Doppler. Birthdate: Patient birthdate: 1941-03-26. Age: Patient is 73 yr old. Sex: Gender: female. BMI: 26.8 kg/m^2. Blood pressure: 118/80 Patient status: Outpatient. Study date: Study date: 05/16/2014. Study time: 08:57 AM. Location: [facility]  -------------------------------------------------------------------  ------------------------------------------------------------------- Left ventricle: The cavity size was normal. There was mild concentric hypertrophy. Systolic function was normal. The estimated ejection fraction was in the range of 60% to 65%. Wall motion was normal; there were no regional wall motion abnormalities. Doppler parameters are consistent with abnormal left ventricular relaxation (grade 1 diastolic dysfunction). The E/e&' ratio is between 8-15, suggesting indeterminate LV filling pressure.  ------------------------------------------------------------------- Aortic valve: Heavy valve calcification and reduced mobility of the fused non-coronary/right coronary cusps, probably bicuspid valve. There is severe aortic stenosis. Peak and mean gradients of 68 mmHG and 37 mmHG respectively. Based on an LVOT diameter of 2.1 cm, the calculated AVA is 0.6-0.7 cm2. Doppler: There was trivial regurgitation. VTI ratio of LVOT to aortic valve: 0.2. Valve area (VTI): 0.67 cm^2. Indexed valve area (VTI): 0.36 cm^2/m^2. Peak velocity ratio of LVOT to aortic valve: 0.21. Valve area (Vmax): 0.74 cm^2. Indexed valve area (Vmax): 0.4 cm^2/m^2. Mean velocity ratio of LVOT to aortic valve: 0.2. Valve area (Vmean): 0.71 cm^2. Indexed valve area (Vmean): 0.38 cm^2/m^2. Mean gradient (S): 37 mm Hg. Peak gradient (S): 68 mm  Hg.  ------------------------------------------------------------------- Aorta: Aortic root: The aortic root was normal in size. Ascending aorta: The ascending aorta was normal in size.  ------------------------------------------------------------------- Mitral valve: Mildly thickened leaflets . Doppler: There was trivial regurgitation. Peak gradient (D): 2 mm Hg.  ------------------------------------------------------------------- Left atrium: The atrium was normal in size.  ------------------------------------------------------------------- Right ventricle:  The cavity size was normal. Wall thickness was normal. Systolic function was normal.  ------------------------------------------------------------------- Pulmonic valve: The valve appears to be grossly normal. Doppler: There was trivial regurgitation.  ------------------------------------------------------------------- Tricuspid valve: Doppler: There was mild regurgitation.  ------------------------------------------------------------------- Pulmonary artery: The main pulmonary artery was normal-sized.  ------------------------------------------------------------------- Right atrium: The atrium was normal in size.  ------------------------------------------------------------------- Pericardium: There was no pericardial effusion.  ------------------------------------------------------------------- Systemic veins: Inferior vena cava: The vessel was normal in size. The respirophasic diameter changes were in the normal range (>= 50%), consistent with normal central venous pressure.  ------------------------------------------------------------------- Measurements  Left ventricle Value Reference LV ID, ED, PLAX chordal 44.8 mm 43 - 52 LV ID, ES, PLAX chordal 28.8 mm 23 - 38 LV fx shortening, PLAX chordal 36 % >=29 LV PW thickness, ED 10.6 mm --------- IVS/LV PW ratio, ED 1.12 <=1.3 Stroke volume, 2D 70 ml  --------- Stroke volume/bsa, 2D 38 ml/m^2 --------- LV e&', lateral 6.24 cm/s --------- LV E/e&', lateral 12.26 --------- LV e&', medial 6.73 cm/s --------- LV E/e&', medial 11.37 --------- LV e&', average 6.49 cm/s --------- LV E/e&', average 11.8 ---------  Ventricular septum Value Reference IVS thickness, ED 11.9 mm ---------  LVOT Value Reference LVOT ID, S 21 mm --------- LVOT area 3.46 cm^2 --------- LVOT ID 21 mm --------- LVOT peak velocity, S 88 cm/s --------- LVOT mean velocity, S 58.5 cm/s --------- LVOT VTI, S 20.2 cm --------- Stroke volume (SV), LVOT DP 70 ml --------- Stroke index (SV/bsa), LVOT DP 37.9 ml/m^2 ---------  Aortic valve Value Reference Aortic valve peak velocity, S 412 cm/s --------- Aortic valve mean velocity, S 286 cm/s --------- Aortic valve VTI, S 103 cm --------- Aortic mean gradient, S 37 mm Hg --------- Aortic peak gradient, S 68 mm Hg --------- VTI ratio, LVOT/AV 0.2 --------- Aortic valve area, VTI 0.67 cm^2 --------- Aortic valve area/bsa, VTI 0.36 cm^2/m^2 --------- Velocity ratio, peak, LVOT/AV 0.21 --------- Aortic valve area, peak velocity 0.74 cm^2 --------- Aortic valve area/bsa, peak 0.4 cm^2/m^2 --------- velocity Velocity ratio, mean, LVOT/AV 0.2 --------- Aortic valve area, mean velocity 0.71 cm^2 --------- Aortic valve area/bsa, mean 0.38 cm^2/m^2 --------- velocity  Aorta Value Reference Aortic root ID, ED 32 mm --------- Ascending aorta ID, A-P, S 37 mm ---------  Left atrium Value Reference LA ID, A-P, ES 41 mm --------- LA ID/bsa, A-P (H) 2.22 cm/m^2 <=2.2 LA volume, ES, 1-p A4C 50 ml --------- LA volume/bsa, ES, 1-p A4C 27.1 ml/m^2 --------- LA volume, ES, 1-p A2C 48 ml --------- LA volume/bsa, ES, 1-p A2C 26 ml/m^2 ---------  Mitral valve Value Reference Mitral E-wave peak velocity 76.5 cm/s --------- Mitral A-wave peak velocity 88.4 cm/s --------- Mitral deceleration time (H) 437 ms 150 - 230 Mitral peak  gradient, D 2 mm Hg --------- Mitral E/A ratio, peak 0.9 ---------  Pulmonary arteries Value Reference PA pressure, S, DP 27 mm Hg <=30  Tricuspid valve Value Reference Tricuspid regurg peak velocity 246 cm/s --------- Tricuspid peak RV-RA gradient 24 mm Hg --------- Tricuspid maximal regurg 246 cm/s --------- velocity, PISA  Systemic veins Value Reference Estimated CVP 3 mm Hg ---------  Right ventricle Value Reference RV pressure, S, DP 27 mm Hg <=30 RV s&', lateral, S 13.3 cm/s ---------  Legend: (L) and (H) mark values outside specified reference range.  ------------------------------------------------------------------- Prepared and Electronically Authenticated by  Lyman Bishop MD 2015-09-22T10:52:31   MRA CHEST WITH OR WITHOUT CONTRAST  TECHNIQUE:   Angiographic images of the chest were obtained using MRA technique   without and with  intravenous contrast.   CONTRAST: 24mL MULTIHANCE GADOBENATE DIMEGLUMINE 529 MG/ML IV SOLN   COMPARISON: Prior CT abdomen/pelvis 05/15/2010   FINDINGS:   VASCULAR   Aorta: Ectasia of the tubular portion of the ascending thoracic   aorta with a maximal diameter of 3.7- 3.8 cm. The aortic root is   within normal limits at 3.3 cm. No effacement of the sino-tubular   junction. The transverse and descending thoracic aorta are within   normal limits. There is a mild ductus bump in the proximal   descending thoracic aorta. Conventional 3 vessel arch anatomy. No   significant stenosis.   Heart: Functionally bicuspid aortic valve. The right and left   coronary cusp moieties are fused with an intervening median raphe   consistent with a type 1 functionally bicuspid valve. The coronaries   sinuses remain relatively symmetric in size. The non coronary cusp   remains independent. The valve itself is thickened with signal   artifact suggesting the presence of calcification. High velocity   jetting is noted on the cardiac gated images consistent  with aortic   stenosis. The heart itself is within normal limits for size. No   pericardial effusion.   Great Vessels: Conventional 3 vessel arch anatomy. No significant   stenosis. Normal caliber pulmonary artery. Pulmonary veins are also   unremarkable within normal drainage pattern.   NON VASCULAR   No signal abnormality or abnormal enhancement in the soft tissues,   musculoskeletal structures, lungs or visualized upper abdominal   organs.   IMPRESSION:   VASCULAR   1. Thickened and likely calcified aortic valve with evidence of   aortic stenosis. Fusion of the right and left coronary cusps with an   intervening median raphe and independent non coronary cusp   consistent with Type 1 functionally bicuspid valve anatomy. The   sinuses of Valsalva remain symmetric in size and appearance.   2. Ectasia of the tubular portion of the ascending thoracic aorta   with a maximal diameter of 3.7- 3.8 cm. No effacement of the   sino-tubular junction. The aortic root remains within normal limits   with a maximal diameter of 3.3 cm at the sinuses of Valsalva.   Recommend annual imaging followup by CTA or MRA. This recommendation   follows 2010 ACCF/AHA/AATS/ACR/ASA/SCA/SCAI/SIR/STS/SVM Guidelines   for the Diagnosis and Management of Patients With Thoracic Aortic   Disease. Circulation. 2010; 121: Y403-K742   3. Small ductus bump noted incidentally along the anterior aspect of   the proximal descending thoracic aorta.   NON VASCULAR   1. No focal signal abnormality or abnormal enhancement.   Signed,   Criselda Peaches, MD   [facility]   [facility]   Electronically Signed   By: Jacqulynn Cadet M.D.   On: 05/19/2014 09:27    Cardiac Catheterization Procedure Note   Name: SALVATORE POE   MRN: [id number]   DOB: 02/23/1941   Procedure: Right Heart Cath, Selective Coronary Angiography   Indication: Severe bicuspid aortic valve stenosis.    Procedural Details: The right radial and brachial areas were prepped, draped, and anesthetized with 1% lidocaine. There was a peripheral IV in the right brachial area that was replaced with 5 French venous sheath. A Swan-Ganz catheter was used for the right heart catheterization. Standard protocol was followed for recording of right heart pressures and sampling of oxygen saturations. Fick cardiac output was calculated. The right radial artery was entered using modified Seldinger technique and a  5 French arterial sheath was placed. The patient received 3 mg IA verapamil and weight-based IV heparin. Standard Judkins catheters were used for selective coronary angiography. There were no immediate procedural complications. The patient was transferred to the post catheterization recovery area for further monitoring.   Procedural Findings:   Hemodynamics (mmHg)   RA mean 4   RV 26/4   PA 23/8, mean 14   PCWP mean 7   AO 108/56   Oxygen saturations:   PA 81%   AO 97%   Cardiac Output (Fick) 7.97   Cardiac Index (Fick) 4.43   Coronary angiography:   Coronary dominance: right   Left mainstem: No significant coronary disease.   Left anterior descending (LAD): No significant coronary disease.   Left circumflex (LCx): Relatively small system. 40% proximal LCx stenosis at take-off of small OM1.   Right coronary artery (RCA): No angiographic CAD.   Left ventriculography: Not done, known severe AS.   Final Conclusions: Normal right and left heart filling pressures, normal cardiac output. No obstructive CAD. Patient has severe AS and has been referred for AVR.   Loralie Champagne   05/25/2014, 10:22 AM    STS Risk Calculator  Procedure                                         AVR  Risk of Mortality                                1.8% Morbidity or Mortality                       13.9% Prolonged LOS                                   4.8% Short LOS                                           37.3% Permanent  Stroke                            1.7% Prolonged Vent Support                      9.3% DSW Infection                                     0.2% Renal Failure                                       3.1% Reoperation                                        6.1%   Impression:  Patient has bicuspid native aortic valve with stage D severe symptomatic aortic stenosis.  Left ventricular systolic function remains normal. There is mild ectasia and enlargement of  the ascending thoracic aorta with maximum transverse diameter approximately 3.7-3.8 cm. I agree the patient would best be treated with elective aortic valve replacement.    Plan:  The patient was counseled at length regarding surgical alternatives with respect to aortic valve replacement including continued medical therapy versus proceeding with conventional surgical aortic valve replacement using either a mechanical prosthesis or a bioprosthetic tissue valve.  Other alternatives including stentless bioprosthetic tissue valve replacement, valve repair, and transcatheter aortic valve replacement were discussed.  Discussion was held comparing the relative risks of mechanical valve replacement with need for lifelong anticoagulation versus use of a bioprosthetic tissue valve and the associated potential for late structural valve deterioration in failure.  This discussion was placed in the context of the patient's particular circumstances, and as a result the patient specifically requests that their valve be replaced using a bioprosthetic tissue valve .  The patient understands and accepts all potential associated risks of surgery including but not limited to risk of death, stroke, myocardial infarction, congestive heart failure, respiratory failure, renal failure, pneumonia, bleeding requiring blood transfusion and or reexploration, arrhythmia, heart block or bradycardia requiring permanent pacemaker, aortic dissection or other major vascular complication,  pleural effusions or other delayed complications related to continued congestive heart failure, and other late complications related to valve replacement including structural valve deterioration and failure, thrombosis, endocarditis, or paravalvular leak.  We plan to proceed with surgery on Wednesday, 06/07/2014.    Valentina Gu. Roxy Manns, MD 05/29/2014 2:20 PM

## 2014-06-05 NOTE — Progress Notes (Signed)
Pre-op Cardiac Surgery  Carotid Findings:  Bilateral:  1-39% ICA stenosis.  Vertebral artery flow is antegrade.      Upper Extremity Right Left  Brachial Pressures 139 137  Radial Waveforms Tri Tri  Ulnar Waveforms Tri Tri  Palmar Arch (Allen's Test) Obliterates with radial compression, normal with ulnar compression Normal    Landry Mellow, RDMS, RVT 06/05/2014

## 2014-06-06 MED ORDER — DOPAMINE-DEXTROSE 3.2-5 MG/ML-% IV SOLN
0.0000 ug/kg/min | INTRAVENOUS | Status: DC
  Filled 2014-06-06: qty 250

## 2014-06-06 MED ORDER — LEVOFLOXACIN IN D5W 500 MG/100ML IV SOLN
500.0000 mg | INTRAVENOUS | Status: DC
  Filled 2014-06-06 (×2): qty 100

## 2014-06-06 MED ORDER — VANCOMYCIN HCL 10 G IV SOLR
1250.0000 mg | INTRAVENOUS | Status: AC
  Administered 2014-06-07: 1250 mg via INTRAVENOUS
  Filled 2014-06-06 (×2): qty 1250

## 2014-06-06 MED ORDER — SODIUM CHLORIDE 0.9 % IV SOLN
INTRAVENOUS | Status: DC
  Filled 2014-06-06: qty 30

## 2014-06-06 MED ORDER — SODIUM CHLORIDE 0.9 % IV SOLN
INTRAVENOUS | Status: AC
  Administered 2014-06-07: 2 [IU]/h via INTRAVENOUS
  Filled 2014-06-06: qty 2.5

## 2014-06-06 MED ORDER — METOPROLOL TARTRATE 12.5 MG HALF TABLET: 12.5000 mg | ORAL_TABLET | Freq: Once | ORAL | Status: DC

## 2014-06-06 MED ORDER — PLASMA-LYTE 148 IV SOLN
INTRAVENOUS | Status: AC
  Administered 2014-06-07: 09:00:00
  Filled 2014-06-06: qty 2.5

## 2014-06-06 MED ORDER — DEXTROSE 5 % IV SOLN
750.0000 mg | INTRAVENOUS | Status: DC
  Filled 2014-06-06: qty 750

## 2014-06-06 MED ORDER — DEXMEDETOMIDINE HCL IN NACL 400 MCG/100ML IV SOLN
0.1000 ug/kg/h | INTRAVENOUS | Status: AC
  Administered 2014-06-07: .2 ug/kg/h via INTRAVENOUS
  Filled 2014-06-06 (×2): qty 100

## 2014-06-06 MED ORDER — NITROGLYCERIN IN D5W 200-5 MCG/ML-% IV SOLN
2.0000 ug/min | INTRAVENOUS | Status: DC
  Filled 2014-06-06: qty 250

## 2014-06-06 MED ORDER — VANCOMYCIN HCL 1000 MG IV SOLR
INTRAVENOUS | Status: AC
  Administered 2014-06-07: 09:00:00
  Filled 2014-06-06: qty 1000

## 2014-06-06 MED ORDER — DEXTROSE 5 % IV SOLN
1.5000 g | INTRAVENOUS | Status: DC
  Filled 2014-06-06: qty 1.5

## 2014-06-06 MED ORDER — SODIUM CHLORIDE 0.9 % IV SOLN
INTRAVENOUS | Status: AC
  Administered 2014-06-07: 69.8 mL/h via INTRAVENOUS
  Filled 2014-06-06: qty 40

## 2014-06-06 MED ORDER — DEXTROSE 5 % IV SOLN
0.5000 ug/min | INTRAVENOUS | Status: DC
  Filled 2014-06-06: qty 4

## 2014-06-06 MED ORDER — POTASSIUM CHLORIDE 2 MEQ/ML IV SOLN
80.0000 meq | INTRAVENOUS | Status: DC
  Filled 2014-06-06: qty 40

## 2014-06-06 MED ORDER — MAGNESIUM SULFATE 50 % IJ SOLN
40.0000 meq | INTRAMUSCULAR | Status: DC
  Filled 2014-06-06: qty 10

## 2014-06-06 MED ORDER — PHENYLEPHRINE HCL 10 MG/ML IJ SOLN
30.0000 ug/min | INTRAVENOUS | Status: AC
  Administered 2014-06-07: 20 ug/min via INTRAVENOUS
  Filled 2014-06-06: qty 2

## 2014-06-06 NOTE — Progress Notes (Signed)
Anesthesia chart review: Patient is a 73 year old female scheduled for aortic valve replacement on 06/07/14 by Dr. Roxy Manns.  History includes non-smoker, bicuspid AV with severe AS, TIA, chronic diastolic CHF, anxiety, GERD, hiatal hernia, HLD, hypothyroidism, collagenous colitis. PCP is Dr. Reynold Bowen.  Cardiologist is Dr. Loralie Champagne.  05/09/14 EKG showed NSR.  Cardiac cath on 05/25/14 showed: 40% LCX at the take-off of small OM1, otherwise no significant CAD.    Echo on 05/16/14 showed: - Left ventricle: The cavity size was normal. There was mild concentric hypertrophy. Systolic function was normal. The estimated ejection fraction was in the range of 60% to 65%. Wall motion was normal; there were no regional wall motion abnormalities. Doppler parameters are consistent with abnormal left ventricular relaxation (grade 1 diastolic dysfunction). The E/e&' ratio is between 8-15, suggesting indeterminate LV filling pressure. - Aortic valve: Heavy valve calcification and reduced mobility of the fused non-coronary/right coronary cusps, probably bicuspid valve. There is severe aortic stenosis. Peak and mean gradients of 68 mmHG and 37 mmHG respectively. Based on an LVOT diameter of 2.1 cm, the calculated AVA is 0.6-0.7 cm2. There was trivial regurgitation. - Mitral valve: Mildly thickened leaflets . There was trivial regurgitation. - Tricuspid valve: There was mild regurgitation. - Pulmonary arteries: PA peak pressure: 27 mm Hg (S).  06/05/14 Carotid duplex: Bilateral: 1-39% ICA stenosis. Vertebral artery flow is antegrade.   CXR on 06/05/14 showed: No edema or consolidation. Heart size unremarkable.   Preoperative PFTs and labs noted.  Labs are marked as reviewed by Dr. Roxy Manns.  Her PTT is elevated at 117, unclear why it is elevated.  Will repeat a STAT PTT on arrival.      [facility] Short Stay Center/Anesthesiology Phone [phone] 06/06/2014 9:19 AM

## 2014-06-07 ENCOUNTER — Inpatient Hospital Stay (HOSPITAL_COMMUNITY)
Admission: RE | Admit: 2014-06-07 | Discharge: 2014-06-13 | DRG: 220 | Disposition: A | Discharge status: Home / Self Care | Payer: Medicare Other | Source: Ambulatory Visit | Attending: Thoracic Surgery (Cardiothoracic Vascular Surgery) | Admitting: Thoracic Surgery (Cardiothoracic Vascular Surgery)

## 2014-06-07 ENCOUNTER — Inpatient Hospital Stay (HOSPITAL_COMMUNITY): Payer: Medicare Other

## 2014-06-07 ENCOUNTER — Encounter (HOSPITAL_COMMUNITY): Payer: Medicare Other | Admitting: Vascular Surgery

## 2014-06-07 ENCOUNTER — Encounter (HOSPITAL_COMMUNITY): Payer: Self-pay | Admitting: *Deleted

## 2014-06-07 ENCOUNTER — Inpatient Hospital Stay (HOSPITAL_COMMUNITY): Payer: Medicare Other | Admitting: Certified Registered"

## 2014-06-07 ENCOUNTER — Encounter (HOSPITAL_COMMUNITY)
Admission: RE | Disposition: A | Discharge status: Home / Self Care | Payer: Medicare Other | Source: Ambulatory Visit | Attending: Thoracic Surgery (Cardiothoracic Vascular Surgery)

## 2014-06-07 DIAGNOSIS — Z881 Allergy status to other antibiotic agents status: Secondary | ICD-10-CM

## 2014-06-07 DIAGNOSIS — I350 Nonrheumatic aortic (valve) stenosis: Secondary | ICD-10-CM

## 2014-06-07 DIAGNOSIS — Z8673 Personal history of transient ischemic attack (TIA), and cerebral infarction without residual deficits: Secondary | ICD-10-CM

## 2014-06-07 DIAGNOSIS — Z885 Allergy status to narcotic agent status: Secondary | ICD-10-CM | POA: Diagnosis not present

## 2014-06-07 DIAGNOSIS — Z8249 Family history of ischemic heart disease and other diseases of the circulatory system: Secondary | ICD-10-CM | POA: Diagnosis not present

## 2014-06-07 DIAGNOSIS — I5032 Chronic diastolic (congestive) heart failure: Secondary | ICD-10-CM | POA: Diagnosis not present

## 2014-06-07 DIAGNOSIS — J9811 Atelectasis: Secondary | ICD-10-CM | POA: Diagnosis not present

## 2014-06-07 DIAGNOSIS — Z882 Allergy status to sulfonamides status: Secondary | ICD-10-CM | POA: Diagnosis not present

## 2014-06-07 DIAGNOSIS — K219 Gastro-esophageal reflux disease without esophagitis: Secondary | ICD-10-CM | POA: Diagnosis present

## 2014-06-07 DIAGNOSIS — J984 Other disorders of lung: Secondary | ICD-10-CM | POA: Diagnosis not present

## 2014-06-07 DIAGNOSIS — F419 Anxiety disorder, unspecified: Secondary | ICD-10-CM | POA: Diagnosis present

## 2014-06-07 DIAGNOSIS — M1990 Unspecified osteoarthritis, unspecified site: Secondary | ICD-10-CM | POA: Diagnosis not present

## 2014-06-07 DIAGNOSIS — Z889 Allergy status to unspecified drugs, medicaments and biological substances status: Secondary | ICD-10-CM

## 2014-06-07 DIAGNOSIS — Z954 Presence of other heart-valve replacement: Secondary | ICD-10-CM | POA: Diagnosis not present

## 2014-06-07 DIAGNOSIS — I9789 Other postprocedural complications and disorders of the circulatory system, not elsewhere classified: Secondary | ICD-10-CM

## 2014-06-07 DIAGNOSIS — E039 Hypothyroidism, unspecified: Secondary | ICD-10-CM | POA: Diagnosis present

## 2014-06-07 DIAGNOSIS — Z452 Encounter for adjustment and management of vascular access device: Secondary | ICD-10-CM | POA: Diagnosis not present

## 2014-06-07 DIAGNOSIS — R918 Other nonspecific abnormal finding of lung field: Secondary | ICD-10-CM | POA: Diagnosis not present

## 2014-06-07 DIAGNOSIS — Z953 Presence of xenogenic heart valve: Secondary | ICD-10-CM

## 2014-06-07 DIAGNOSIS — E785 Hyperlipidemia, unspecified: Secondary | ICD-10-CM | POA: Diagnosis present

## 2014-06-07 DIAGNOSIS — I358 Other nonrheumatic aortic valve disorders: Secondary | ICD-10-CM | POA: Diagnosis not present

## 2014-06-07 DIAGNOSIS — I480 Paroxysmal atrial fibrillation: Secondary | ICD-10-CM | POA: Diagnosis not present

## 2014-06-07 DIAGNOSIS — D62 Acute posthemorrhagic anemia: Secondary | ICD-10-CM | POA: Diagnosis not present

## 2014-06-07 DIAGNOSIS — M159 Polyosteoarthritis, unspecified: Secondary | ICD-10-CM | POA: Diagnosis present

## 2014-06-07 DIAGNOSIS — Q2381 Bicuspid aortic valve: Secondary | ICD-10-CM

## 2014-06-07 DIAGNOSIS — I4891 Unspecified atrial fibrillation: Secondary | ICD-10-CM | POA: Diagnosis not present

## 2014-06-07 DIAGNOSIS — Z9889 Other specified postprocedural states: Secondary | ICD-10-CM | POA: Diagnosis not present

## 2014-06-07 DIAGNOSIS — J90 Pleural effusion, not elsewhere classified: Secondary | ICD-10-CM | POA: Diagnosis not present

## 2014-06-07 DIAGNOSIS — Q231 Congenital insufficiency of aortic valve: Secondary | ICD-10-CM

## 2014-06-07 DIAGNOSIS — Z951 Presence of aortocoronary bypass graft: Secondary | ICD-10-CM | POA: Diagnosis not present

## 2014-06-07 HISTORY — PX: AORTIC VALVE REPLACEMENT: SHX41

## 2014-06-07 HISTORY — DX: Presence of xenogenic heart valve: Z95.3

## 2014-06-07 HISTORY — DX: Unspecified atrial fibrillation: I48.91

## 2014-06-07 HISTORY — PX: INTRAOPERATIVE TRANSESOPHAGEAL ECHOCARDIOGRAM: SHX5062

## 2014-06-07 HISTORY — DX: Other postprocedural complications and disorders of the circulatory system, not elsewhere classified: I97.89

## 2014-06-07 LAB — POCT I-STAT 3, ART BLOOD GAS (G3+)
ACID-BASE DEFICIT: 2 mmol/L (ref 0.0–2.0)
Acid-Base Excess: 1 mmol/L (ref 0.0–2.0)
Acid-base deficit: 5 mmol/L — ABNORMAL HIGH (ref 0.0–2.0)
Acid-base deficit: 4 mmol/L — ABNORMAL HIGH (ref 0.0–2.0)
Bicarbonate: 23.6 mEq/L (ref 20.0–24.0)
Bicarbonate: 25 mEq/L — ABNORMAL HIGH (ref 20.0–24.0)
Bicarbonate: 23.6 mEq/L (ref 20.0–24.0)
Bicarbonate: 20.5 mEq/L (ref 20.0–24.0)
Bicarbonate: 21.3 mEq/L (ref 20.0–24.0)
O2 SAT: 99 %
O2 SAT: 98 %
O2 Saturation: 100 %
O2 Saturation: 100 %
O2 Saturation: 99 %
PCO2 ART: 40.4 mmHg (ref 35.0–45.0)
PCO2 ART: 37.6 mmHg (ref 35.0–45.0)
PH ART: 7.463 — AB (ref 7.350–7.450)
PH ART: 7.424 (ref 7.350–7.450)
PO2 ART: 377 mmHg — AB (ref 80.0–100.0)
PO2 ART: 111 mmHg — AB (ref 80.0–100.0)
Patient temperature: 36.7
Patient temperature: 36.5
TCO2: 25 mmol/L (ref 0–100)
TCO2: 26 mmol/L (ref 0–100)
TCO2: 25 mmol/L (ref 0–100)
TCO2: 22 mmol/L (ref 0–100)
TCO2: 22 mmol/L (ref 0–100)
pCO2 arterial: 32.9 mmHg — ABNORMAL LOW (ref 35.0–45.0)
pCO2 arterial: 38.2 mmHg (ref 35.0–45.0)
pCO2 arterial: 40.3 mmHg (ref 35.0–45.0)
pH, Arterial: 7.375 (ref 7.350–7.450)
pH, Arterial: 7.312 — ABNORMAL LOW (ref 7.350–7.450)
pH, Arterial: 7.359 (ref 7.350–7.450)
pO2, Arterial: 412 mmHg — ABNORMAL HIGH (ref 80.0–100.0)
pO2, Arterial: 147 mmHg — ABNORMAL HIGH (ref 80.0–100.0)
pO2, Arterial: 159 mmHg — ABNORMAL HIGH (ref 80.0–100.0)

## 2014-06-07 LAB — CREATININE, SERUM
CREATININE: 0.8 mg/dL (ref 0.50–1.10)
GFR calc Af Amer: 84 mL/min — ABNORMAL LOW (ref >90)
GFR calc non Af Amer: 72 mL/min — ABNORMAL LOW (ref >90)

## 2014-06-07 LAB — POCT I-STAT, CHEM 8
BUN: 17 mg/dL (ref 6–23)
BUN: 15 mg/dL (ref 6–23)
BUN: 16 mg/dL (ref 6–23)
BUN: 15 mg/dL (ref 6–23)
BUN: 13 mg/dL (ref 6–23)
CALCIUM ION: 1.2 mmol/L (ref 1.13–1.30)
CALCIUM ION: 1.13 mmol/L (ref 1.13–1.30)
CHLORIDE: 109 meq/L (ref 96–112)
CREATININE: 0.6 mg/dL (ref 0.50–1.10)
CREATININE: 0.5 mg/dL (ref 0.50–1.10)
Calcium, Ion: 1.01 mmol/L — ABNORMAL LOW (ref 1.13–1.30)
Calcium, Ion: 1.21 mmol/L (ref 1.13–1.30)
Calcium, Ion: 1.1 mmol/L — ABNORMAL LOW (ref 1.13–1.30)
Chloride: 107 mEq/L (ref 96–112)
Chloride: 101 mEq/L (ref 96–112)
Chloride: 105 mEq/L (ref 96–112)
Chloride: 103 mEq/L (ref 96–112)
Creatinine, Ser: 0.6 mg/dL (ref 0.50–1.10)
Creatinine, Ser: 0.5 mg/dL (ref 0.50–1.10)
Creatinine, Ser: 0.7 mg/dL (ref 0.50–1.10)
Glucose, Bld: 101 mg/dL — ABNORMAL HIGH (ref 70–99)
Glucose, Bld: 117 mg/dL — ABNORMAL HIGH (ref 70–99)
Glucose, Bld: 126 mg/dL — ABNORMAL HIGH (ref 70–99)
Glucose, Bld: 114 mg/dL — ABNORMAL HIGH (ref 70–99)
Glucose, Bld: 148 mg/dL — ABNORMAL HIGH (ref 70–99)
HCT: 27 % — ABNORMAL LOW (ref 36.0–46.0)
HCT: 33 % — ABNORMAL LOW (ref 36.0–46.0)
HEMATOCRIT: 35 % — AB (ref 36.0–46.0)
HEMATOCRIT: 24 % — AB (ref 36.0–46.0)
HEMATOCRIT: 32 % — AB (ref 36.0–46.0)
HEMOGLOBIN: 11.9 g/dL — AB (ref 12.0–15.0)
HEMOGLOBIN: 10.9 g/dL — AB (ref 12.0–15.0)
Hemoglobin: 8.2 g/dL — ABNORMAL LOW (ref 12.0–15.0)
Hemoglobin: 9.2 g/dL — ABNORMAL LOW (ref 12.0–15.0)
Hemoglobin: 11.2 g/dL — ABNORMAL LOW (ref 12.0–15.0)
POTASSIUM: 3.6 meq/L — AB (ref 3.7–5.3)
POTASSIUM: 5.1 meq/L (ref 3.7–5.3)
POTASSIUM: 4.1 meq/L (ref 3.7–5.3)
Potassium: 3.8 mEq/L (ref 3.7–5.3)
Potassium: 4 mEq/L (ref 3.7–5.3)
SODIUM: 139 meq/L (ref 137–147)
SODIUM: 140 meq/L (ref 137–147)
SODIUM: 140 meq/L (ref 137–147)
Sodium: 135 mEq/L — ABNORMAL LOW (ref 137–147)
Sodium: 139 mEq/L (ref 137–147)
TCO2: 23 mmol/L (ref 0–100)
TCO2: 22 mmol/L (ref 0–100)
TCO2: 23 mmol/L (ref 0–100)
TCO2: 24 mmol/L (ref 0–100)
TCO2: 19 mmol/L (ref 0–100)

## 2014-06-07 LAB — CBC
HEMATOCRIT: 31.8 % — AB (ref 36.0–46.0)
HEMATOCRIT: 31.9 % — AB (ref 36.0–46.0)
Hemoglobin: 10.8 g/dL — ABNORMAL LOW (ref 12.0–15.0)
Hemoglobin: 10.7 g/dL — ABNORMAL LOW (ref 12.0–15.0)
MCH: 28.1 pg (ref 26.0–34.0)
MCH: 27.9 pg (ref 26.0–34.0)
MCHC: 34 g/dL (ref 30.0–36.0)
MCHC: 33.5 g/dL (ref 30.0–36.0)
MCV: 82.6 fL (ref 78.0–100.0)
MCV: 83.1 fL (ref 78.0–100.0)
PLATELETS: 164 10*3/uL (ref 150–400)
Platelets: 183 10*3/uL (ref 150–400)
RBC: 3.85 MIL/uL — AB (ref 3.87–5.11)
RBC: 3.84 MIL/uL — ABNORMAL LOW (ref 3.87–5.11)
RDW: 15.2 % (ref 11.5–15.5)
RDW: 15.3 % (ref 11.5–15.5)
WBC: 13.3 10*3/uL — ABNORMAL HIGH (ref 4.0–10.5)
WBC: 12.7 10*3/uL — ABNORMAL HIGH (ref 4.0–10.5)

## 2014-06-07 LAB — MAGNESIUM: Magnesium: 2.9 mg/dL — ABNORMAL HIGH (ref 1.5–2.5)

## 2014-06-07 LAB — HEMOGLOBIN AND HEMATOCRIT, BLOOD
HEMATOCRIT: 24 % — AB (ref 36.0–46.0)
HEMOGLOBIN: 8.1 g/dL — AB (ref 12.0–15.0)

## 2014-06-07 LAB — POCT I-STAT 4, (NA,K, GLUC, HGB,HCT)
GLUCOSE: 93 mg/dL (ref 70–99)
HCT: 32 % — ABNORMAL LOW (ref 36.0–46.0)
HEMOGLOBIN: 10.9 g/dL — AB (ref 12.0–15.0)
POTASSIUM: 3.8 meq/L (ref 3.7–5.3)
SODIUM: 139 meq/L (ref 137–147)

## 2014-06-07 LAB — PROTIME-INR
INR: 1.31 (ref 0.00–1.49)
Prothrombin Time: 16.4 seconds — ABNORMAL HIGH (ref 11.6–15.2)

## 2014-06-07 LAB — APTT
APTT: 30 s (ref 24–37)
aPTT: 33 seconds (ref 24–37)

## 2014-06-07 LAB — PLATELET COUNT: Platelets: 196 10*3/uL (ref 150–400)

## 2014-06-07 LAB — GLUCOSE, CAPILLARY
GLUCOSE-CAPILLARY: 93 mg/dL (ref 70–99)
GLUCOSE-CAPILLARY: 88 mg/dL (ref 70–99)

## 2014-06-07 SURGERY — REPLACEMENT, AORTIC VALVE, OPEN
Anesthesia: General | Site: Chest

## 2014-06-07 MED ORDER — CHLORHEXIDINE GLUCONATE 4 % EX LIQD
30.0000 mL | CUTANEOUS | Status: DC
  Filled 2014-06-07: qty 30

## 2014-06-07 MED ORDER — SODIUM CHLORIDE 0.9 % IJ SOLN
INTRAMUSCULAR | Status: AC
  Filled 2014-06-07: qty 20

## 2014-06-07 MED ORDER — PROPOFOL 10 MG/ML IV BOLUS
INTRAVENOUS | Status: AC
  Filled 2014-06-07: qty 20

## 2014-06-07 MED ORDER — MIDAZOLAM HCL 2 MG/2ML IJ SOLN: 2.0000 mg | INTRAMUSCULAR | Status: DC | PRN

## 2014-06-07 MED ORDER — PROTAMINE SULFATE 10 MG/ML IV SOLN
INTRAVENOUS | Status: DC | PRN
  Administered 2014-06-07: 100 mg via INTRAVENOUS
  Administered 2014-06-07: 50 mg via INTRAVENOUS
  Administered 2014-06-07: 80 mg via INTRAVENOUS
  Administered 2014-06-07: 20 mg via INTRAVENOUS

## 2014-06-07 MED ORDER — ACETAMINOPHEN 160 MG/5ML PO SOLN: 650.0000 mg | Freq: Once | ORAL | Status: AC

## 2014-06-07 MED ORDER — ALBUMIN HUMAN 5 % IV SOLN
250.0000 mL | INTRAVENOUS | Status: AC | PRN
  Administered 2014-06-07 (×3): 250 mL via INTRAVENOUS
  Filled 2014-06-07: qty 250

## 2014-06-07 MED ORDER — SODIUM CHLORIDE 0.45 % IV SOLN
INTRAVENOUS | Status: DC
  Administered 2014-06-07: 20 mL/h via INTRAVENOUS

## 2014-06-07 MED ORDER — SUCCINYLCHOLINE CHLORIDE 20 MG/ML IJ SOLN
INTRAMUSCULAR | Status: AC
  Filled 2014-06-07: qty 1

## 2014-06-07 MED ORDER — PANTOPRAZOLE SODIUM 40 MG PO TBEC
40.0000 mg | DELAYED_RELEASE_TABLET | Freq: Every day | ORAL | Status: DC
  Administered 2014-06-09 – 2014-06-13 (×5): 40 mg via ORAL
  Filled 2014-06-07 (×5): qty 1

## 2014-06-07 MED ORDER — DEXMEDETOMIDINE HCL IN NACL 200 MCG/50ML IV SOLN: 0.1000 ug/kg/h | INTRAVENOUS | Status: DC

## 2014-06-07 MED ORDER — DOCUSATE SODIUM 100 MG PO CAPS
200.0000 mg | ORAL_CAPSULE | Freq: Every day | ORAL | Status: DC
  Administered 2014-06-10: 200 mg via ORAL
  Filled 2014-06-07 (×6): qty 2

## 2014-06-07 MED ORDER — VECURONIUM BROMIDE 10 MG IV SOLR
INTRAVENOUS | Status: AC
  Filled 2014-06-07: qty 10

## 2014-06-07 MED ORDER — NITROGLYCERIN IN D5W 200-5 MCG/ML-% IV SOLN: 0.0000 ug/min | INTRAVENOUS | Status: DC

## 2014-06-07 MED ORDER — VANCOMYCIN HCL IN DEXTROSE 1-5 GM/200ML-% IV SOLN
1000.0000 mg | Freq: Once | INTRAVENOUS | Status: AC
  Administered 2014-06-07: 1000 mg via INTRAVENOUS
  Filled 2014-06-07: qty 200

## 2014-06-07 MED ORDER — FAMOTIDINE IN NACL 20-0.9 MG/50ML-% IV SOLN
20.0000 mg | Freq: Two times a day (BID) | INTRAVENOUS | Status: AC
  Administered 2014-06-07: 20 mg via INTRAVENOUS

## 2014-06-07 MED ORDER — SODIUM CHLORIDE 0.9 % IV SOLN
250.0000 mL | INTRAVENOUS | Status: AC
  Administered 2014-06-07: 1000 mL via INTRAVENOUS

## 2014-06-07 MED ORDER — METOPROLOL TARTRATE 12.5 MG HALF TABLET
12.5000 mg | ORAL_TABLET | Freq: Two times a day (BID) | ORAL | Status: DC
  Filled 2014-06-07 (×3): qty 1

## 2014-06-07 MED ORDER — LEVOFLOXACIN IN D5W 750 MG/150ML IV SOLN
750.0000 mg | INTRAVENOUS | Status: AC
  Administered 2014-06-08: 750 mg via INTRAVENOUS
  Filled 2014-06-07: qty 150

## 2014-06-07 MED ORDER — ASPIRIN EC 325 MG PO TBEC
325.0000 mg | DELAYED_RELEASE_TABLET | Freq: Every day | ORAL | Status: DC
  Administered 2014-06-08 – 2014-06-11 (×4): 325 mg via ORAL
  Filled 2014-06-07 (×5): qty 1

## 2014-06-07 MED ORDER — SODIUM BICARBONATE 8.4 % IV SOLN
50.0000 meq | Freq: Once | INTRAVENOUS | Status: AC
  Administered 2014-06-07: 50 meq via INTRAVENOUS

## 2014-06-07 MED ORDER — CETYLPYRIDINIUM CHLORIDE 0.05 % MT LIQD
7.0000 mL | Freq: Two times a day (BID) | OROMUCOSAL | Status: DC
  Administered 2014-06-08 – 2014-06-12 (×6): 7 mL via OROMUCOSAL

## 2014-06-07 MED ORDER — LACTATED RINGERS IV SOLN: 500.0000 mL | Freq: Once | INTRAVENOUS | Status: AC | PRN

## 2014-06-07 MED ORDER — PROPOFOL 10 MG/ML IV BOLUS
INTRAVENOUS | Status: DC | PRN
  Administered 2014-06-07: 110 mg via INTRAVENOUS

## 2014-06-07 MED ORDER — FENTANYL CITRATE 0.05 MG/ML IJ SOLN
INTRAMUSCULAR | Status: AC
  Filled 2014-06-07: qty 5

## 2014-06-07 MED ORDER — LEVOTHYROXINE SODIUM 125 MCG PO TABS
125.0000 ug | ORAL_TABLET | ORAL | Status: DC
  Administered 2014-06-09 – 2014-06-13 (×4): 125 ug via ORAL
  Filled 2014-06-07 (×6): qty 1

## 2014-06-07 MED ORDER — ROCURONIUM BROMIDE 50 MG/5ML IV SOLN
INTRAVENOUS | Status: AC
  Filled 2014-06-07: qty 1

## 2014-06-07 MED ORDER — METOPROLOL TARTRATE 1 MG/ML IV SOLN: 2.5000 mg | INTRAVENOUS | Status: DC | PRN

## 2014-06-07 MED ORDER — HEPARIN SODIUM (PORCINE) 1000 UNIT/ML IJ SOLN
INTRAMUSCULAR | Status: AC
  Filled 2014-06-07: qty 1

## 2014-06-07 MED ORDER — ASPIRIN 81 MG PO CHEW: 324.0000 mg | CHEWABLE_TABLET | Freq: Every day | ORAL | Status: DC

## 2014-06-07 MED ORDER — SODIUM CHLORIDE 0.9 % IV SOLN: INTRAVENOUS | Status: DC

## 2014-06-07 MED ORDER — SODIUM CHLORIDE 0.9 % IJ SOLN: 3.0000 mL | INTRAMUSCULAR | Status: DC | PRN

## 2014-06-07 MED ORDER — LACTATED RINGERS IV SOLN
INTRAVENOUS | Status: DC | PRN
  Administered 2014-06-07 (×4): via INTRAVENOUS

## 2014-06-07 MED ORDER — LACTATED RINGERS IV SOLN: INTRAVENOUS | Status: DC

## 2014-06-07 MED ORDER — ONDANSETRON HCL 4 MG/2ML IJ SOLN
4.0000 mg | Freq: Four times a day (QID) | INTRAMUSCULAR | Status: DC | PRN
  Administered 2014-06-07 – 2014-06-10 (×5): 4 mg via INTRAVENOUS
  Filled 2014-06-07 (×5): qty 2

## 2014-06-07 MED ORDER — MIDAZOLAM HCL 5 MG/5ML IJ SOLN
INTRAMUSCULAR | Status: DC | PRN
  Administered 2014-06-07 (×4): 2 mg via INTRAVENOUS
  Administered 2014-06-07: 1 mg via INTRAVENOUS

## 2014-06-07 MED ORDER — POTASSIUM CHLORIDE 10 MEQ/50ML IV SOLN
10.0000 meq | INTRAVENOUS | Status: AC
  Administered 2014-06-07 (×3): 10 meq via INTRAVENOUS

## 2014-06-07 MED ORDER — OXYCODONE HCL 5 MG PO TABS: 5.0000 mg | ORAL_TABLET | ORAL | Status: DC | PRN

## 2014-06-07 MED ORDER — MORPHINE SULFATE 2 MG/ML IJ SOLN: 1.0000 mg | INTRAMUSCULAR | Status: AC | PRN

## 2014-06-07 MED ORDER — HEPARIN SODIUM (PORCINE) 1000 UNIT/ML IJ SOLN
INTRAMUSCULAR | Status: DC | PRN
  Administered 2014-06-07: 25000 [IU] via INTRAVENOUS

## 2014-06-07 MED ORDER — ACETAMINOPHEN 650 MG RE SUPP
650.0000 mg | Freq: Once | RECTAL | Status: AC
  Administered 2014-06-07: 650 mg via RECTAL

## 2014-06-07 MED ORDER — DEXTROSE 5 % IV SOLN
0.0000 ug/min | INTRAVENOUS | Status: DC
  Filled 2014-06-07: qty 2

## 2014-06-07 MED ORDER — SODIUM CHLORIDE 0.9 % IR SOLN
Status: DC | PRN
  Administered 2014-06-07: 1000 mL

## 2014-06-07 MED ORDER — ACETAMINOPHEN 500 MG PO TABS
1000.0000 mg | ORAL_TABLET | Freq: Four times a day (QID) | ORAL | Status: AC
  Administered 2014-06-08 – 2014-06-12 (×12): 1000 mg via ORAL
  Filled 2014-06-07 (×19): qty 2

## 2014-06-07 MED ORDER — PROTAMINE SULFATE 10 MG/ML IV SOLN
INTRAVENOUS | Status: AC
  Filled 2014-06-07: qty 25

## 2014-06-07 MED ORDER — MORPHINE SULFATE 2 MG/ML IJ SOLN
2.0000 mg | INTRAMUSCULAR | Status: DC | PRN
  Administered 2014-06-07 – 2014-06-08 (×4): 2 mg via INTRAVENOUS
  Filled 2014-06-07 (×5): qty 1

## 2014-06-07 MED ORDER — INSULIN REGULAR HUMAN 100 UNIT/ML IJ SOLN
INTRAMUSCULAR | Status: DC
  Administered 2014-06-07: 2.6 [IU]/h via INTRAVENOUS
  Filled 2014-06-07 (×2): qty 2.5

## 2014-06-07 MED ORDER — SODIUM CHLORIDE 0.9 % IJ SOLN
3.0000 mL | Freq: Two times a day (BID) | INTRAMUSCULAR | Status: DC
  Administered 2014-06-08: 10 mL via INTRAVENOUS
  Administered 2014-06-08: 6 mL via INTRAVENOUS
  Administered 2014-06-10: 3 mL via INTRAVENOUS

## 2014-06-07 MED ORDER — LEVOFLOXACIN IN D5W 500 MG/100ML IV SOLN
INTRAVENOUS | Status: DC | PRN
  Administered 2014-06-07: 500 mg via INTRAVENOUS

## 2014-06-07 MED ORDER — BISACODYL 5 MG PO TBEC
10.0000 mg | DELAYED_RELEASE_TABLET | Freq: Every day | ORAL | Status: DC
  Administered 2014-06-10: 10 mg via ORAL
  Filled 2014-06-07 (×4): qty 2

## 2014-06-07 MED ORDER — MIDAZOLAM HCL 10 MG/2ML IJ SOLN
INTRAMUSCULAR | Status: AC
  Filled 2014-06-07: qty 2

## 2014-06-07 MED ORDER — LIDOCAINE HCL (CARDIAC) 20 MG/ML IV SOLN
INTRAVENOUS | Status: AC
  Filled 2014-06-07: qty 5

## 2014-06-07 MED ORDER — INSULIN REGULAR BOLUS VIA INFUSION
0.0000 [IU] | Freq: Three times a day (TID) | INTRAVENOUS | Status: DC
  Filled 2014-06-07: qty 10

## 2014-06-07 MED ORDER — BISACODYL 10 MG RE SUPP: 10.0000 mg | Freq: Every day | RECTAL | Status: DC

## 2014-06-07 MED ORDER — VECURONIUM BROMIDE 10 MG IV SOLR
INTRAVENOUS | Status: DC | PRN
  Administered 2014-06-07: 5 mg via INTRAVENOUS
  Administered 2014-06-07: 2 mg via INTRAVENOUS

## 2014-06-07 MED ORDER — ROCURONIUM BROMIDE 100 MG/10ML IV SOLN
INTRAVENOUS | Status: DC | PRN
  Administered 2014-06-07: 50 mg via INTRAVENOUS

## 2014-06-07 MED ORDER — SODIUM CHLORIDE 0.9 % IJ SOLN
INTRAMUSCULAR | Status: DC | PRN
  Administered 2014-06-07: 09:00:00 via TOPICAL

## 2014-06-07 MED ORDER — METOPROLOL TARTRATE 25 MG/10 ML ORAL SUSPENSION
12.5000 mg | Freq: Two times a day (BID) | ORAL | Status: DC
  Filled 2014-06-07 (×3): qty 5

## 2014-06-07 MED ORDER — ACETAMINOPHEN 160 MG/5ML PO SOLN
1000.0000 mg | Freq: Four times a day (QID) | ORAL | Status: DC
Start: 2014-06-08 — End: 2014-06-08

## 2014-06-07 MED ORDER — MAGNESIUM SULFATE 4000MG/100ML IJ SOLN
4.0000 g | Freq: Once | INTRAMUSCULAR | Status: AC
  Administered 2014-06-07: 4 g via INTRAVENOUS
  Filled 2014-06-07: qty 100

## 2014-06-07 MED ORDER — FENTANYL CITRATE 0.05 MG/ML IJ SOLN
INTRAMUSCULAR | Status: DC | PRN
  Administered 2014-06-07 (×2): 100 ug via INTRAVENOUS
  Administered 2014-06-07: 50 ug via INTRAVENOUS
  Administered 2014-06-07: 150 ug via INTRAVENOUS
  Administered 2014-06-07 (×2): 250 ug via INTRAVENOUS
  Administered 2014-06-07: 200 ug via INTRAVENOUS
  Administered 2014-06-07: 250 ug via INTRAVENOUS

## 2014-06-07 MED FILL — Heparin Sodium (Porcine) Inj 1000 Unit/ML: INTRAMUSCULAR | Qty: 10 | Status: AC

## 2014-06-07 MED FILL — Electrolyte-R (PH 7.4) Solution: INTRAVENOUS | Qty: 3000 | Status: AC

## 2014-06-07 MED FILL — Lidocaine HCl IV Inj 20 MG/ML: INTRAVENOUS | Qty: 5 | Status: AC

## 2014-06-07 MED FILL — Sodium Bicarbonate IV Soln 8.4%: INTRAVENOUS | Qty: 50 | Status: AC

## 2014-06-07 MED FILL — Mannitol IV Soln 20%: INTRAVENOUS | Qty: 500 | Status: AC

## 2014-06-07 MED FILL — Sodium Chloride IV Soln 0.9%: INTRAVENOUS | Qty: 2000 | Status: AC

## 2014-06-07 SURGICAL SUPPLY — 83 items
ADAPTER CARDIO PERF ANTE/RETRO (ADAPTER) ×3 IMPLANT
ATTRACTOMAT 16X20 MAGNETIC DRP (DRAPES) ×3 IMPLANT
BLADE STERNUM SYSTEM 6 (BLADE) ×3 IMPLANT
BLADE SURG 11 STRL SS (BLADE) ×3 IMPLANT
CANISTER SUCTION 2500CC (MISCELLANEOUS) ×3 IMPLANT
CANNULA EZ GLIDE AORTIC 21FR (CANNULA) IMPLANT
CANNULA GUNDRY RCSP 15FR (MISCELLANEOUS) ×3 IMPLANT
CANNULA SOFTFLOW AORTIC 7M21FR (CANNULA) ×3 IMPLANT
CATH CPB KIT OWEN (MISCELLANEOUS) ×3 IMPLANT
CATH HEART VENT LEFT (CATHETERS) ×2 IMPLANT
CATH THORACIC 36FR RT ANG (CATHETERS) ×3 IMPLANT
CONT SPECI 4OZ STER CLIK (MISCELLANEOUS) ×3 IMPLANT
COVER SURGICAL LIGHT HANDLE (MISCELLANEOUS) ×3 IMPLANT
CRADLE DONUT ADULT HEAD (MISCELLANEOUS) ×3 IMPLANT
DEVICE SUT CK QUICK LOAD MINI (Prosthesis & Implant Heart) ×3 IMPLANT
DRAIN CHANNEL 32F RND 10.7 FF (WOUND CARE) ×3 IMPLANT
DRAPE BILATERAL SPLIT (DRAPES) IMPLANT
DRAPE CARDIOVASCULAR INCISE (DRAPES) ×1
DRAPE CV SPLIT W-CLR ANES SCRN (DRAPES) IMPLANT
DRAPE INCISE IOBAN 66X45 STRL (DRAPES) ×6 IMPLANT
DRAPE SLUSH/WARMER DISC (DRAPES) ×3 IMPLANT
DRAPE SRG 135X102X78XABS (DRAPES) ×2 IMPLANT
DRSG COVADERM 4X14 (GAUZE/BANDAGES/DRESSINGS) ×3 IMPLANT
ELECT REM PT RETURN 9FT ADLT (ELECTROSURGICAL) ×6
ELECTRODE REM PT RTRN 9FT ADLT (ELECTROSURGICAL) ×4 IMPLANT
GAUZE SPONGE 4X4 12PLY STRL (GAUZE/BANDAGES/DRESSINGS) ×6 IMPLANT
GLOVE BIO SURGEON STRL SZ 6 (GLOVE) ×12 IMPLANT
GLOVE BIO SURGEON STRL SZ 6.5 (GLOVE) IMPLANT
GLOVE BIO SURGEON STRL SZ7 (GLOVE) IMPLANT
GLOVE BIO SURGEON STRL SZ7.5 (GLOVE) IMPLANT
GLOVE ORTHO TXT STRL SZ7.5 (GLOVE) ×9 IMPLANT
GOWN STRL REUS W/ TWL LRG LVL3 (GOWN DISPOSABLE) ×16 IMPLANT
GOWN STRL REUS W/TWL LRG LVL3 (GOWN DISPOSABLE) ×8
HEMOSTAT POWDER SURGIFOAM 1G (HEMOSTASIS) ×9 IMPLANT
INSERT FOGARTY XLG (MISCELLANEOUS) ×3 IMPLANT
KIT BASIN OR (CUSTOM PROCEDURE TRAY) ×3 IMPLANT
KIT ROOM TURNOVER OR (KITS) ×3 IMPLANT
KIT SUCTION CATH 14FR (SUCTIONS) ×12 IMPLANT
KIT SUT CK MINI COMBO 4X17 (Prosthesis & Implant Heart) ×3 IMPLANT
LEAD PACING MYOCARDI (MISCELLANEOUS) ×3 IMPLANT
NS IRRIG 1000ML POUR BTL (IV SOLUTION) ×18 IMPLANT
PACK OPEN HEART (CUSTOM PROCEDURE TRAY) ×3 IMPLANT
PAD ARMBOARD 7.5X6 YLW CONV (MISCELLANEOUS) ×6 IMPLANT
SET IRRIG TUBING LAPAROSCOPIC (IRRIGATION / IRRIGATOR) ×3 IMPLANT
SOLUTION ANTI FOG 6CC (MISCELLANEOUS) ×3 IMPLANT
SPONGE GAUZE 4X4 12PLY STER LF (GAUZE/BANDAGES/DRESSINGS) ×3 IMPLANT
SPONGE LAP 4X18 X RAY DECT (DISPOSABLE) ×3 IMPLANT
SUT BONE WAX W31G (SUTURE) ×3 IMPLANT
SUT ETHIBON 2 0 V 52N 30 (SUTURE) ×6 IMPLANT
SUT ETHIBON EXCEL 2-0 V-5 (SUTURE) IMPLANT
SUT ETHIBOND 2 0 SH (SUTURE)
SUT ETHIBOND 2 0 SH 36X2 (SUTURE) IMPLANT
SUT ETHIBOND 2 0 V4 (SUTURE) IMPLANT
SUT ETHIBOND 2 0V4 GREEN (SUTURE) IMPLANT
SUT ETHIBOND 4 0 RB 1 (SUTURE) IMPLANT
SUT ETHIBOND V-5 VALVE (SUTURE) IMPLANT
SUT ETHIBOND X763 2 0 SH 1 (SUTURE) ×9 IMPLANT
SUT MNCRL AB 3-0 PS2 18 (SUTURE) ×6 IMPLANT
SUT PDS AB 1 CTX 36 (SUTURE) ×6 IMPLANT
SUT PROLENE 4 0 RB 1 (SUTURE) ×5
SUT PROLENE 4 0 SH DA (SUTURE) ×3 IMPLANT
SUT PROLENE 4-0 RB1 .5 CRCL 36 (SUTURE) ×10 IMPLANT
SUT PROLENE 5 0 C 1 36 (SUTURE) ×6 IMPLANT
SUT PROLENE 6 0 C 1 30 (SUTURE) IMPLANT
SUT SILK  1 MH (SUTURE) ×1
SUT SILK 1 MH (SUTURE) ×2 IMPLANT
SUT SILK 2 0 SH CR/8 (SUTURE) IMPLANT
SUT SILK 3 0 SH CR/8 (SUTURE) IMPLANT
SUT STEEL 6MS V (SUTURE) IMPLANT
SUT STEEL STERNAL CCS#1 18IN (SUTURE) ×3 IMPLANT
SUT STEEL SZ 6 DBL 3X14 BALL (SUTURE) ×6 IMPLANT
SUT VIC AB 2-0 CTX 27 (SUTURE) IMPLANT
SUTURE E-PAK OPEN HEART (SUTURE) ×3 IMPLANT
SYSTEM SAHARA CHEST DRAIN ATS (WOUND CARE) ×3 IMPLANT
TAPE CLOTH SURG 4X10 WHT LF (GAUZE/BANDAGES/DRESSINGS) ×3 IMPLANT
TAPE PAPER 2X10 WHT MICROPORE (GAUZE/BANDAGES/DRESSINGS) ×3 IMPLANT
TOWEL OR 17X24 6PK STRL BLUE (TOWEL DISPOSABLE) ×6 IMPLANT
TOWEL OR 17X26 10 PK STRL BLUE (TOWEL DISPOSABLE) ×6 IMPLANT
TRAY FOLEY IC TEMP SENS 16FR (CATHETERS) ×3 IMPLANT
UNDERPAD 30X30 INCONTINENT (UNDERPADS AND DIAPERS) ×3 IMPLANT
VALVE MAGNA EASE AORTIC 23MM (Prosthesis & Implant Heart) ×3 IMPLANT
VENT LEFT HEART 12002 (CATHETERS) ×3
WATER STERILE IRR 1000ML POUR (IV SOLUTION) ×6 IMPLANT

## 2014-06-07 NOTE — Interval H&P Note (Signed)
History and Physical Interval Note:  06/07/2014 6:57 AM  Becky Winters  has presented today for surgery, with the diagnosis of Aortic stenosis  The various methods of treatment have been discussed with the patient and family. After consideration of risks, benefits and other options for treatment, the patient has consented to  Procedure(s): AORTIC VALVE REPLACEMENT (AVR) (N/A) INTRAOPERATIVE TRANSESOPHAGEAL ECHOCARDIOGRAM (N/A) as a surgical intervention .  The patient's history has been reviewed, patient examined, no change in status, stable for surgery.  I have reviewed the patient's chart and labs.  Questions were answered to the patient's satisfaction.     [name],[name]

## 2014-06-07 NOTE — Progress Notes (Signed)
S/p AVR  extubated BP 112/60  Pulse 89  Temp(Src) 97.7 F (36.5 C) (Oral)  Resp 14  Ht 5\' 5"  (1.651 m)  Wt 161 lb (73.029 kg)  BMI 26.79 kg/m2  SpO2 98%  25/15 CO= 5.26   Intake/Output Summary (Last 24 hours) at 06/07/14 1824 Last data filed at 06/07/14 1800  Gross per 24 hour  Intake 4052.42 ml  Output   2280 ml  Net 1772.42 ml   Doing well early postop

## 2014-06-07 NOTE — Anesthesia Postprocedure Evaluation (Signed)
  Anesthesia Post-op Note  Patient: Becky Winters  Procedure(s) Performed: Procedure(s): AORTIC VALVE REPLACEMENT  (AVR) with 22mm Aortic Perimount Magna Ease (N/A) INTRAOPERATIVE TRANSESOPHAGEAL ECHOCARDIOGRAM (N/A)  Patient Location: ICU  Anesthesia Type:General  Level of Consciousness: sedated  Airway and Oxygen Therapy: Patient remains intubated per anesthesia plan  Post-op Pain: none  Post-op Assessment: Post-op Vital signs reviewed, Patient's Cardiovascular Status Stable and Respiratory Function Stable  Post-op Vital Signs: Reviewed and stable  Last Vitals:  Filed Vitals:   06/07/14 1615  BP:   Pulse: 90  Temp: 36.7 C  Resp: 20    Complications: No apparent anesthesia complications

## 2014-06-07 NOTE — Procedures (Signed)
Extubation Procedure Note  Patient Details:   Name: Becky Winters DOB: 1941-04-03 MRN: [id number]   Airway Documentation:     Evaluation  O2 sats: stable throughout Complications: No apparent complications Patient did tolerate procedure well. Bilateral Breath Sounds: Clear;Diminished   Yes  Pt extubated to 3L [place] per Dr Roxy Manns. Pt Vital Capacity 700, and unable to do NIF. MV on Ventilator 8.6, VT 450. Pt stable throughout extubation. No complications. Pt able to speak name and location post extubation.    Jesse Sans 06/07/2014, 5:22 PM

## 2014-06-07 NOTE — Brief Op Note (Addendum)
      [address]       [place] [address]             [phone]     06/07/2014  10:55 AM  PATIENT:  Becky Winters  73 y.o. female  PRE-OPERATIVE DIAGNOSIS:  Aortic stenosis  POST-OPERATIVE DIAGNOSIS:  Aortic stenosis  PROCEDURE:  Procedure(s): AORTIC VALVE REPLACEMENT  (AVR) with 58mm Aortic Perimount Magna Ease INTRAOPERATIVE TRANSESOPHAGEAL ECHOCARDIOGRAM  SURGEON:    Rexene Alberts, MD  ASSISTANTS:  John Giovanni, PA-C  ANESTHESIA:   Albertha Ghee, MD  CROSSCLAMP TIME:   76'  CARDIOPULMONARY BYPASS TIME: 72'  FINDINGS:  Bicuspid aortic valve with severe aortic stenosis  Normal LV systolic function  Mild to moderate LVH  Trivial mitral regurgitation   Aortic Valve Etiology   Aortic Insufficiency:  Trivial/Trace  Aortic Valve Disease:  Yes.  Aortic Stenosis:  Yes. Smallest Aortic Valve Area: 0.6cm2; Highest Mean Gradient: 61mmHg.  Etiology (Choose at least one and up to  5 etiologies):  Bicuspid valve disease and Degenerative - Calcified Aortic Valve  Procedure Performed:  Replacement: Yes.  Bioprosthetic Valve. Implant Model Number:3300TFX, Size:23, Unique Device Identifier:[id number]  Repair/Reconstruction: No.   Aortic Annular Enlargement: No.   COMPLICATIONS: None  BASELINE WEIGHT: 73 kg  PATIENT DISPOSITION:   TO SICU IN STABLE CONDITION  [name],[name] 06/07/2014 11:48 AM

## 2014-06-07 NOTE — Anesthesia Procedure Notes (Signed)
Procedures Procedures: Right IJ Gordy Councilman Catheter Insertion: [id number]: The patient was identified and consent obtained.  TO was performed, and full barrier precautions were used.  The skin was anesthetized with lidocaine-4cc plain with 25g needle.  Once the vein was located with the 22 ga. needle using ultrasound guidance , the wire was inserted into the vein.  The wire location was confirmed with ultrasound.  The tissue was dilated and the 8.5 [place] cordis catheter was carefully inserted. Afterwards Gordy Councilman catheter was inserted. PA catheter at 48cm.  The patient tolerated the procedure well.   CE

## 2014-06-07 NOTE — Op Note (Signed)
CARDIOTHORACIC SURGERY OPERATIVE NOTE  Date of Procedure:  06/07/2014  Preoperative Diagnosis: Severe Aortic Stenosis   Postoperative Diagnosis: Same   Procedure:    Aortic Valve Replacement  Edwards Magna Ease Pericardial Tissue Valve (size 23 mm, model # 3300TFX, serial # [id number])   Surgeon: Valentina Gu. Roxy Manns, MD  Assistant: John Giovanni, PA-C  Anesthesia: Albertha Ghee, MD  Operative Findings: Bicuspid aortic valve with severe aortic stenosis  Normal LV systolic function  Mild to moderate LVH  Trivial mitral regurgitation          BRIEF CLINICAL NOTE AND INDICATIONS FOR SURGERY  Patient is a 73 year old female with history of bicuspid aortic valve and aortic stenosis who has been referred for possible elective aortic valve replacement. The patient states that she has a long-standing history of mild exertional shortness of breath. She was noted to have a heart murmur on physical exam and diagnosed with likely bicuspid aortic valve and mild aortic stenosis in 2012. The patient states that over the past 2-3 months she has developed worsening symptoms of exertional shortness of breath with occasional episodes of transient dizzy spells or lightheadedness with occasional tachypalpitations. Symptoms have progressed such that now the patient gets short of breath with relatively mild activity. She has had increasing dizzy spells without syncope. She has intermittently measured her pulse and blood pressure, and at times she gets dizzy her blood pressure typically runs low with systolic pressure in the 66-063 range. She was seen in followup by Dr. Aundra Dubin and transthoracic echocardiogram demonstrated progression of severity of aortic stenosis with peak velocity measured across the aortic valve approximately 4.1 m/s corresponding to a mean transvalvular gradient of 68 and 37 mm mercury, respectively. The calculated aortic valve area arranged between 0.6 and 0.7 cm. Left ventricular  systolic function remained normal with ejection fraction estimated 60-65%. MRA of the chest revealed mild ectasia of the ascending thoracic aorta with maximum transverse diameter 3.7-3.8 cm. Left and right heart catheterization revealed nonobstructive coronary artery disease with normal right-sided filling pressures. The patient was referred for surgical consultation.  The patient has been seen in consultation and counseled at length regarding the indications, risks and potential benefits of surgery.  All questions have been answered, and the patient provides full informed consent for the operation as described.     DETAILS OF THE OPERATIVE PROCEDURE  Preparation:  The patient is brought to the operating room on the above mentioned date and central monitoring was established by the anesthesia team including placement of Swan-Ganz catheter and radial arterial line. The patient is placed in the supine position on the operating table.  Intravenous antibiotics are administered. General endotracheal anesthesia is induced uneventfully. A Foley catheter is placed.  Baseline transesophageal echocardiogram was performed.  Findings were notable for bicuspid native aortic valve with severe aortic stenosis. There was trace aortic insufficiency. There was normal left ventricular systolic function with mild to moderate left ventricular hypertrophy. There was trivial mitral regurgitation.  The patient's chest, abdomen, both groins, and both lower extremities are prepared and draped in a sterile manner. A time out procedure is performed.   Surgical Approach:  A median sternotomy incision was performed and the pericardium is opened. The ascending aorta is normal in appearance.    Extracorporeal Cardiopulmonary Bypass and Myocardial Protection:  The ascending aorta and the right atrium are cannulated for cardiopulmonary bypass.  Adequate heparinization is verified.   A retrograde cardioplegia cannula is placed  through the right atrium into the coronary  sinus.  The operative field was continuously flooded with carbon dioxide gas.  The entire pre-bypass portion of the operation was notable for stable hemodynamics.  Cardiopulmonary bypass was begun and the surface of the heart is inspected.  A cardioplegia cannula is placed in the ascending aorta.  A temperature probe was placed in the interventricular septum.  The patient is cooled to 32C systemic temperature.  The aortic cross clamp is applied and cold blood cardioplegia is delivered initially in an antegrade fashion through the aortic root.  Supplemental cardioplegia is given retrograde through the coronary sinus catheter.  Iced saline slush is applied for topical hypothermia.  The initial cardioplegic arrest is rapid with early diastolic arrest.  Repeat doses of cardioplegia are administered intermittently throughout the entire cross clamp portion of the operation through the coronary sinus catheter in order to maintain completely flat electrocardiogram and septal myocardial temperature below 15C.  Myocardial protection was felt to be excellent.   Aortic Valve Replacement:  An oblique transverse aortotomy incision was performed.  The aortic valve was inspected and notable for bicuspid aortic valve and severe aortic stenosis.  There was fusion of the left and right cusps of the valve.  The aortic valve leaflets were excised sharply and the aortic annulus decalcified.  Decalcification was notably straightforward.  The aortic annulus was sized to accept a 23 mm prosthesis.  The aortic root and left ventricle were irrigated with copious cold saline solution.  Aortic valve replacement was performed using interrupted horizontal mattress 2-0 Ethibond pledgeted sutures with pledgets in the subannular position.  An [facility] Ease pericardial tissue valve (size 23 mm, model # 3300TFX, serial # [id number]) was implanted uneventfully. The valve seated appropriately  with adequate space beneath the left main and right coronary artery.   Procedure Completion:  The aortotomy was closed using a 2-layer closure of running 4-0 Prolene suture.  One final dose of warm retrograde "hot shot" cardioplegia was administered retrograde through the coronary sinus catheter while all air was evacuated through the aortic root.  The aortic cross clamp was removed after a total cross clamp time of 54 minutes.  Epicardial pacing wires are fixed to the right ventricular outflow tract and to the right atrial appendage. The patient is rewarmed to 37C temperature. The aortic and left ventricular vents are removed.  The patient is weaned and disconnected from cardiopulmonary bypass.  The patient's rhythm at separation from bypass was sinus.  The patient was weaned from cardioplegic bypass without any inotropic support. Total cardiopulmonary bypass time for the operation was 75 minutes.  Followup transesophageal echocardiogram performed after separation from bypass revealed a well-seated aortic valve prosthesis that was functioning normally and without any sign of perivalvular leak.  Left ventricular function was unchanged from preoperatively.  The aortic and venous cannula were removed uneventfully. Protamine was administered to reverse the anticoagulation. The mediastinum and pleural space were inspected for hemostasis and irrigated with saline solution. The mediastinum was drained using 2 chest tubes placed through separate stab incisions inferiorly.  The soft tissues anterior to the aorta were reapproximated loosely. The sternum is closed with double strength sternal wire. The soft tissues anterior to the sternum were closed in multiple layers and the skin is closed with a running subcuticular skin closure.  The post-bypass portion of the operation was notable for stable rhythm and hemodynamics.  No blood products were administered during the operation.   Disposition:  The  patient tolerated the procedure well and is transported to  the surgical intensive care in stable condition. There are no intraoperative complications. All sponge instrument and needle counts are verified correct at completion of the operation.    Valentina Gu. Roxy Manns MD 06/07/2014 11:52 AM

## 2014-06-07 NOTE — Transfer of Care (Signed)
Immediate Anesthesia Transfer of Care Note  Patient: Becky Winters  Procedure(s) Performed: Procedure(s): AORTIC VALVE REPLACEMENT  (AVR) with 29mm Aortic Perimount Magna Ease (N/A) INTRAOPERATIVE TRANSESOPHAGEAL ECHOCARDIOGRAM (N/A)  Patient Location: SICU  Anesthesia Type:General  Level of Consciousness: sedated, unresponsive and Patient remains intubated per anesthesia plan  Airway & Oxygen Therapy: Patient remains intubated per anesthesia plan and Patient placed on Ventilator (see vital sign flow sheet for setting)  Post-op Assessment: Report given to PACU RN and Post -op Vital signs reviewed and stable  Post vital signs: Reviewed and stable  Complications: No apparent anesthesia complications

## 2014-06-07 NOTE — Anesthesia Preprocedure Evaluation (Signed)
Anesthesia Evaluation  Patient identified by MRN, date of birth, ID band Patient awake    Reviewed: Allergy & Precautions, H&P , NPO status , Patient's Chart, lab work & pertinent test results  Airway Mallampati: II  Neck ROM: full    Dental   Pulmonary shortness of breath,          Cardiovascular +CHF + Valvular Problems/Murmurs AS     Neuro/Psych Anxiety TIA   GI/Hepatic hiatal hernia, GERD-  ,  Endo/Other  Hypothyroidism   Renal/GU      Musculoskeletal  (+) Arthritis -,   Abdominal   Peds  Hematology   Anesthesia Other Findings   Reproductive/Obstetrics                           Anesthesia Physical Anesthesia Plan  ASA: III  Anesthesia Plan: General   Post-op Pain Management:    Induction: Intravenous  Airway Management Planned: Oral ETT  Additional Equipment: Arterial line, CVP, PA Cath, Ultrasound Guidance Line Placement and TEE  Intra-op Plan:   Post-operative Plan: Post-operative intubation/ventilation  Informed Consent: I have reviewed the patients History and Physical, chart, labs and discussed the procedure including the risks, benefits and alternatives for the proposed anesthesia with the patient or authorized representative who has indicated his/her understanding and acceptance.     Plan Discussed with: CRNA, Anesthesiologist and Surgeon  Anesthesia Plan Comments:         Anesthesia Quick Evaluation

## 2014-06-07 NOTE — Progress Notes (Signed)
*  PRELIMINARY RESULTS* Echocardiogram Echocardiogram Transesophageal has been performed.  [name] 06/07/2014, 9:40 AM

## 2014-06-08 ENCOUNTER — Inpatient Hospital Stay (HOSPITAL_COMMUNITY): Payer: Medicare Other

## 2014-06-08 LAB — CBC
HCT: 32.2 % — ABNORMAL LOW (ref 36.0–46.0)
HCT: 31.7 % — ABNORMAL LOW (ref 36.0–46.0)
Hemoglobin: 10.5 g/dL — ABNORMAL LOW (ref 12.0–15.0)
Hemoglobin: 10.4 g/dL — ABNORMAL LOW (ref 12.0–15.0)
MCH: 27.6 pg (ref 26.0–34.0)
MCH: 27.9 pg (ref 26.0–34.0)
MCHC: 32.6 g/dL (ref 30.0–36.0)
MCHC: 32.8 g/dL (ref 30.0–36.0)
MCV: 84.5 fL (ref 78.0–100.0)
MCV: 85 fL (ref 78.0–100.0)
PLATELETS: 143 10*3/uL — AB (ref 150–400)
Platelets: 167 10*3/uL (ref 150–400)
RBC: 3.81 MIL/uL — AB (ref 3.87–5.11)
RBC: 3.73 MIL/uL — ABNORMAL LOW (ref 3.87–5.11)
RDW: 15.5 % (ref 11.5–15.5)
RDW: 15.7 % — ABNORMAL HIGH (ref 11.5–15.5)
WBC: 12.8 10*3/uL — ABNORMAL HIGH (ref 4.0–10.5)
WBC: 13.1 10*3/uL — AB (ref 4.0–10.5)

## 2014-06-08 LAB — POCT I-STAT 3, ART BLOOD GAS (G3+)
ACID-BASE DEFICIT: 5 mmol/L — AB (ref 0.0–2.0)
BICARBONATE: 20.3 meq/L (ref 20.0–24.0)
O2 Saturation: 98 %
PO2 ART: 103 mmHg — AB (ref 80.0–100.0)
TCO2: 21 mmol/L (ref 0–100)
pCO2 arterial: 35.8 mmHg (ref 35.0–45.0)
pH, Arterial: 7.36 (ref 7.350–7.450)

## 2014-06-08 LAB — GLUCOSE, CAPILLARY
GLUCOSE-CAPILLARY: 105 mg/dL — AB (ref 70–99)
GLUCOSE-CAPILLARY: 109 mg/dL — AB (ref 70–99)
GLUCOSE-CAPILLARY: 111 mg/dL — AB (ref 70–99)
GLUCOSE-CAPILLARY: 117 mg/dL — AB (ref 70–99)
GLUCOSE-CAPILLARY: 125 mg/dL — AB (ref 70–99)
Glucose-Capillary: 101 mg/dL — ABNORMAL HIGH (ref 70–99)
Glucose-Capillary: 110 mg/dL — ABNORMAL HIGH (ref 70–99)
Glucose-Capillary: 112 mg/dL — ABNORMAL HIGH (ref 70–99)
Glucose-Capillary: 114 mg/dL — ABNORMAL HIGH (ref 70–99)
Glucose-Capillary: 116 mg/dL — ABNORMAL HIGH (ref 70–99)
Glucose-Capillary: 119 mg/dL — ABNORMAL HIGH (ref 70–99)
Glucose-Capillary: 124 mg/dL — ABNORMAL HIGH (ref 70–99)
Glucose-Capillary: 126 mg/dL — ABNORMAL HIGH (ref 70–99)
Glucose-Capillary: 132 mg/dL — ABNORMAL HIGH (ref 70–99)
Glucose-Capillary: 140 mg/dL — ABNORMAL HIGH (ref 70–99)
Glucose-Capillary: 142 mg/dL — ABNORMAL HIGH (ref 70–99)
Glucose-Capillary: 147 mg/dL — ABNORMAL HIGH (ref 70–99)
Glucose-Capillary: 148 mg/dL — ABNORMAL HIGH (ref 70–99)
Glucose-Capillary: 97 mg/dL (ref 70–99)

## 2014-06-08 LAB — BASIC METABOLIC PANEL
ANION GAP: 13 (ref 5–15)
BUN: 14 mg/dL (ref 6–23)
CALCIUM: 7.9 mg/dL — AB (ref 8.4–10.5)
CO2: 19 meq/L (ref 19–32)
Chloride: 110 mEq/L (ref 96–112)
Creatinine, Ser: 0.7 mg/dL (ref 0.50–1.10)
GFR calc Af Amer: >90 mL/min (ref >90)
GFR calc non Af Amer: 85 mL/min — ABNORMAL LOW (ref >90)
Glucose, Bld: 110 mg/dL — ABNORMAL HIGH (ref 70–99)
Potassium: 4 mEq/L (ref 3.7–5.3)
SODIUM: 142 meq/L (ref 137–147)

## 2014-06-08 LAB — POCT I-STAT, CHEM 8
BUN: 16 mg/dL (ref 6–23)
CREATININE: 0.7 mg/dL (ref 0.50–1.10)
Calcium, Ion: 1.22 mmol/L (ref 1.13–1.30)
Chloride: 106 mEq/L (ref 96–112)
Glucose, Bld: 107 mg/dL — ABNORMAL HIGH (ref 70–99)
HEMATOCRIT: 32 % — AB (ref 36.0–46.0)
HEMOGLOBIN: 10.9 g/dL — AB (ref 12.0–15.0)
Potassium: 4 mEq/L (ref 3.7–5.3)
SODIUM: 139 meq/L (ref 137–147)
TCO2: 19 mmol/L (ref 0–100)

## 2014-06-08 LAB — MAGNESIUM
Magnesium: 2.4 mg/dL (ref 1.5–2.5)
Magnesium: 2 mg/dL (ref 1.5–2.5)

## 2014-06-08 LAB — CREATININE, SERUM
Creatinine, Ser: 0.75 mg/dL (ref 0.50–1.10)
GFR, EST NON AFRICAN AMERICAN: 83 mL/min — AB (ref >90)

## 2014-06-08 MED ORDER — SODIUM CHLORIDE 0.9 % IV SOLN
INTRAVENOUS | Status: DC
  Administered 2014-06-08: 10 mL/h via INTRAVENOUS
  Administered 2014-06-10: 19:00:00 via INTRAVENOUS

## 2014-06-08 MED ORDER — METOCLOPRAMIDE HCL 5 MG/ML IJ SOLN
10.0000 mg | Freq: Four times a day (QID) | INTRAMUSCULAR | Status: AC
  Administered 2014-06-08 (×4): 10 mg via INTRAVENOUS
  Filled 2014-06-08 (×3): qty 2

## 2014-06-08 MED ORDER — INSULIN ASPART 100 UNIT/ML ~~LOC~~ SOLN: 0.0000 [IU] | SUBCUTANEOUS | Status: DC

## 2014-06-08 MED ORDER — PROMETHAZINE HCL 25 MG/ML IJ SOLN
6.2500 mg | INTRAMUSCULAR | Status: DC | PRN
  Administered 2014-06-08: 6.25 mg via INTRAVENOUS
  Filled 2014-06-08: qty 1

## 2014-06-08 MED ORDER — INSULIN ASPART 100 UNIT/ML ~~LOC~~ SOLN
0.0000 [IU] | SUBCUTANEOUS | Status: DC
  Administered 2014-06-08 – 2014-06-09 (×2): 2 [IU] via SUBCUTANEOUS

## 2014-06-08 MED ORDER — FUROSEMIDE 10 MG/ML IJ SOLN
20.0000 mg | Freq: Once | INTRAMUSCULAR | Status: AC
  Administered 2014-06-08: 20 mg via INTRAVENOUS

## 2014-06-08 MED ORDER — ALPRAZOLAM 0.25 MG PO TABS
0.2500 mg | ORAL_TABLET | Freq: Every evening | ORAL | Status: DC | PRN
  Administered 2014-06-11 (×2): 0.25 mg via ORAL
  Filled 2014-06-08 (×2): qty 1

## 2014-06-08 MED ORDER — MIDAZOLAM HCL 2 MG/2ML IJ SOLN: 2.0000 mg | Freq: Once | INTRAMUSCULAR | Status: DC

## 2014-06-08 MED ORDER — MORPHINE SULFATE 2 MG/ML IJ SOLN: 1.0000 mg | INTRAMUSCULAR | Status: DC | PRN

## 2014-06-08 MED FILL — Potassium Chloride Inj 2 mEq/ML: INTRAVENOUS | Qty: 40 | Status: AC

## 2014-06-08 MED FILL — Heparin Sodium (Porcine) Inj 1000 Unit/ML: INTRAMUSCULAR | Qty: 30 | Status: AC

## 2014-06-08 MED FILL — Magnesium Sulfate Inj 50%: INTRAMUSCULAR | Qty: 10 | Status: AC

## 2014-06-08 NOTE — Progress Notes (Signed)
Patient ID: Becky Winters, female   DOB: 09/20/1940, 73 y.o.   MRN: [id number]  SICU Evening Rounds:  Hemodynamically stable  Up in chair and comfortable  Urine output good CBC    Component Value Date/Time   WBC 13.1* 06/08/2014 1705   RBC 3.73* 06/08/2014 1705   HGB 10.9* 06/08/2014 1712   HCT 32.0* 06/08/2014 1712   PLT 143* 06/08/2014 1705   MCV 85.0 06/08/2014 1705   MCH 27.9 06/08/2014 1705   MCHC 32.8 06/08/2014 1705   RDW 15.7* 06/08/2014 1705   LYMPHSABS 2.7 05/19/2014 1429   MONOABS 0.7 05/19/2014 1429   EOSABS 0.1 05/19/2014 1429   BASOSABS 0.0 05/19/2014 1429    BMET    Component Value Date/Time   NA 139 06/08/2014 1712   K 4.0 06/08/2014 1712   CL 106 06/08/2014 1712   CO2 19 06/08/2014 0400   GLUCOSE 107* 06/08/2014 1712   BUN 16 06/08/2014 1712   CREATININE 0.70 06/08/2014 1712   CALCIUM 7.9* 06/08/2014 0400   GFRNONAA 83* 06/08/2014 1705   GFRAA >90 06/08/2014 1705    A/P: stable day.

## 2014-06-08 NOTE — Progress Notes (Signed)
Utilization review completed. Yulianna Folse, RN, BSN. 

## 2014-06-08 NOTE — Progress Notes (Addendum)
      [address]       [place] [address]             [phone]        CARDIOTHORACIC SURGERY PROGRESS NOTE   R1 Day Post-Op Procedure(s) (LRB): AORTIC VALVE REPLACEMENT  (AVR) with 41mm Aortic Perimount Magna Ease (N/A) INTRAOPERATIVE TRANSESOPHAGEAL ECHOCARDIOGRAM (N/A)  Subjective: Feels nauseated.  Mild soreness in chest.  Otherwise looks very good.  Objective: Vital signs: BP Readings from Last 1 Encounters:  06/08/14 97/56   Pulse Readings from Last 1 Encounters:  06/08/14 90   Resp Readings from Last 1 Encounters:  06/08/14 21   Temp Readings from Last 1 Encounters:  06/08/14 98.1 F (36.7 C)     Hemodynamics: PAP: (18-35)/(8-22) 24/12 mmHg CO:  [2.8 L/min-5.6 L/min] 4.6 L/min CI:  [1.6 L/min/m2-3.1 L/min/m2] 2.5 L/min/m2  Physical Exam:  Rhythm:   Sinus brady - AAI paced  Breath sounds: clear  Heart sounds:  RRR  Incisions:  Dressing dry, intact  Abdomen:  Soft, non-distended, non-tender  Extremities:  Warm, well-perfused    Intake/Output from previous day: 10/14 0701 - 10/15 0700 In: 5470.7 [I.V.:3972.7; Blood:218; NG/GT:30; IV Piggyback:1250] Out: 2965 [Urine:2095; Emesis/NG output:150; Blood:550; Chest Tube:170] Intake/Output this shift:    Lab Results:  CBC: Recent Labs  06/07/14 1800 06/07/14 1804 06/08/14 0400  WBC 12.7*  --  12.8*  HGB 10.7* 11.2* 10.5*  HCT 31.9* 33.0* 32.2*  PLT 164  --  167    BMET:  Recent Labs  06/05/14 0928  06/07/14 1804 06/08/14 0400  NA 138  < > 140 142  K 4.4  < > 4.1 4.0  CL 103  < > 109 110  CO2 19  --   --  19  GLUCOSE 106*  < > 148* 110*  BUN 15  < > 13 14  CREATININE 0.77  < > 0.70 0.70  CALCIUM 9.4  --   --  7.9*  < > = values in this interval not displayed.   CBG (last 3)   Recent Labs  06/08/14 0305 06/08/14 0401 06/08/14 0458  GLUCAP 110* 101* 97    ABG    Component Value Date/Time   PHART 7.360 06/08/2014 0403   PCO2ART 35.8 06/08/2014 0403   PO2ART 103.0* 06/08/2014 0403   HCO3 20.3 06/08/2014 0403   TCO2 21 06/08/2014 0403   ACIDBASEDEF 5.0* 06/08/2014 0403   O2SAT 98.0 06/08/2014 0403    CXR: Clear, mild bibasilar atelectasis  Assessment/Plan: S/P Procedure(s) (LRB): AORTIC VALVE REPLACEMENT  (AVR) with 3mm Aortic Perimount Magna Ease (N/A) INTRAOPERATIVE TRANSESOPHAGEAL ECHOCARDIOGRAM (N/A)  Doing well POD1 Maintaining NSR- AAI paced rhythm w/ stable hemodynamics off all drips Expected post op acute blood loss anemia, mild, stable Expected post op volume excess, mild Expected post op atelectasis, mild Post op nausea Chronic anxiety   Mobilize  D/C tubes and lines  Add reglan and phenergan as needed  Becky Winters 06/08/2014 7:50 AM

## 2014-06-09 ENCOUNTER — Ambulatory Visit: Payer: Medicare Other | Admitting: Cardiology

## 2014-06-09 ENCOUNTER — Inpatient Hospital Stay (HOSPITAL_COMMUNITY): Payer: Medicare Other

## 2014-06-09 DIAGNOSIS — I4891 Unspecified atrial fibrillation: Secondary | ICD-10-CM

## 2014-06-09 DIAGNOSIS — I9789 Other postprocedural complications and disorders of the circulatory system, not elsewhere classified: Secondary | ICD-10-CM

## 2014-06-09 HISTORY — DX: Unspecified atrial fibrillation: I48.91

## 2014-06-09 LAB — CBC
HCT: 31.9 % — ABNORMAL LOW (ref 36.0–46.0)
HEMOGLOBIN: 10.5 g/dL — AB (ref 12.0–15.0)
MCH: 28.6 pg (ref 26.0–34.0)
MCHC: 32.9 g/dL (ref 30.0–36.0)
MCV: 86.9 fL (ref 78.0–100.0)
PLATELETS: 156 10*3/uL (ref 150–400)
RBC: 3.67 MIL/uL — ABNORMAL LOW (ref 3.87–5.11)
RDW: 15.8 % — AB (ref 11.5–15.5)
WBC: 12.2 10*3/uL — ABNORMAL HIGH (ref 4.0–10.5)

## 2014-06-09 LAB — GLUCOSE, CAPILLARY
GLUCOSE-CAPILLARY: 145 mg/dL — AB (ref 70–99)
Glucose-Capillary: 110 mg/dL — ABNORMAL HIGH (ref 70–99)
Glucose-Capillary: 120 mg/dL — ABNORMAL HIGH (ref 70–99)
Glucose-Capillary: 109 mg/dL — ABNORMAL HIGH (ref 70–99)
Glucose-Capillary: 136 mg/dL — ABNORMAL HIGH (ref 70–99)

## 2014-06-09 LAB — BASIC METABOLIC PANEL
ANION GAP: 11 (ref 5–15)
BUN: 17 mg/dL (ref 6–23)
CHLORIDE: 104 meq/L (ref 96–112)
CO2: 23 mEq/L (ref 19–32)
CREATININE: 0.79 mg/dL (ref 0.50–1.10)
Calcium: 8.5 mg/dL (ref 8.4–10.5)
GFR calc Af Amer: >90 mL/min (ref >90)
GFR, EST NON AFRICAN AMERICAN: 82 mL/min — AB (ref >90)
Glucose, Bld: 113 mg/dL — ABNORMAL HIGH (ref 70–99)
Potassium: 4.1 mEq/L (ref 3.7–5.3)
Sodium: 138 mEq/L (ref 137–147)

## 2014-06-09 MED ORDER — LACTULOSE 10 GM/15ML PO SOLN: 20.0000 g | Freq: Every day | ORAL | Status: DC | PRN

## 2014-06-09 MED ORDER — POTASSIUM CHLORIDE CRYS ER 20 MEQ PO TBCR
20.0000 meq | EXTENDED_RELEASE_TABLET | Freq: Every day | ORAL | Status: AC
  Administered 2014-06-10 – 2014-06-11 (×2): 20 meq via ORAL
  Filled 2014-06-09 (×2): qty 1

## 2014-06-09 MED ORDER — AMIODARONE HCL IN DEXTROSE 360-4.14 MG/200ML-% IV SOLN
60.0000 mg/h | INTRAVENOUS | Status: AC
  Administered 2014-06-09: 150 mg/h via INTRAVENOUS
  Administered 2014-06-09 (×2): 60 mg/h via INTRAVENOUS
  Filled 2014-06-09: qty 200

## 2014-06-09 MED ORDER — AMIODARONE HCL 200 MG PO TABS
200.0000 mg | ORAL_TABLET | Freq: Two times a day (BID) | ORAL | Status: DC
  Administered 2014-06-10 – 2014-06-11 (×4): 200 mg via ORAL
  Filled 2014-06-09 (×6): qty 1

## 2014-06-09 MED ORDER — MOVING RIGHT ALONG BOOK
Freq: Once | Status: AC
  Administered 2014-06-09: 10:00:00
  Filled 2014-06-09: qty 1

## 2014-06-09 MED ORDER — LACTULOSE 10 GM/15ML PO SOLN
20.0000 g | Freq: Every day | ORAL | Status: DC | PRN
  Administered 2014-06-10: 20 g via ORAL
  Filled 2014-06-09: qty 30

## 2014-06-09 MED ORDER — AMIODARONE HCL 200 MG PO TABS: 200.0000 mg | ORAL_TABLET | Freq: Every day | ORAL | Status: DC

## 2014-06-09 MED ORDER — SODIUM CHLORIDE 0.9 % IV SOLN: 250.0000 mL | INTRAVENOUS | Status: DC | PRN

## 2014-06-09 MED ORDER — POTASSIUM CHLORIDE 10 MEQ/50ML IV SOLN
10.0000 meq | Freq: Once | INTRAVENOUS | Status: AC
  Administered 2014-06-09: 10 meq via INTRAVENOUS

## 2014-06-09 MED ORDER — AMIODARONE HCL IN DEXTROSE 360-4.14 MG/200ML-% IV SOLN
30.0000 mg/h | INTRAVENOUS | Status: DC
  Administered 2014-06-09 – 2014-06-10 (×2): 30 mg/h via INTRAVENOUS
  Filled 2014-06-09: qty 200
  Filled 2014-06-09: qty 400
  Filled 2014-06-09 (×6): qty 200

## 2014-06-09 MED ORDER — FUROSEMIDE 40 MG PO TABS
40.0000 mg | ORAL_TABLET | Freq: Every day | ORAL | Status: AC
  Administered 2014-06-09 – 2014-06-11 (×3): 40 mg via ORAL
  Filled 2014-06-09 (×3): qty 1

## 2014-06-09 MED ORDER — FUROSEMIDE 20 MG PO TABS
20.0000 mg | ORAL_TABLET | Freq: Every day | ORAL | Status: DC
  Filled 2014-06-09: qty 1

## 2014-06-09 MED ORDER — TRAMADOL HCL 50 MG PO TABS: 50.0000 mg | ORAL_TABLET | Freq: Four times a day (QID) | ORAL | Status: DC | PRN

## 2014-06-09 MED ORDER — LOPERAMIDE HCL 2 MG PO CAPS
2.0000 mg | ORAL_CAPSULE | ORAL | Status: DC | PRN
  Administered 2014-06-10 – 2014-06-13 (×2): 2 mg via ORAL
  Filled 2014-06-09 (×5): qty 1

## 2014-06-09 MED ORDER — SODIUM CHLORIDE 0.9 % IJ SOLN: 3.0000 mL | INTRAMUSCULAR | Status: DC | PRN

## 2014-06-09 MED ORDER — FOLIC ACID 0.5 MG HALF TAB
0.5000 mg | ORAL_TABLET | Freq: Every day | ORAL | Status: DC
  Administered 2014-06-09 – 2014-06-11 (×3): 0.5 mg via ORAL
  Filled 2014-06-09 (×4): qty 1

## 2014-06-09 MED ORDER — ATORVASTATIN CALCIUM 20 MG PO TABS
20.0000 mg | ORAL_TABLET | Freq: Every day | ORAL | Status: DC
  Administered 2014-06-10 – 2014-06-12 (×3): 20 mg via ORAL
  Filled 2014-06-09 (×4): qty 1

## 2014-06-09 MED ORDER — AMIODARONE LOAD VIA INFUSION
150.0000 mg | Freq: Once | INTRAVENOUS | Status: DC
  Filled 2014-06-09: qty 83.34

## 2014-06-09 MED ORDER — FOLIC ACID 800 MCG PO TABS: 400.0000 ug | ORAL_TABLET | Freq: Every day | ORAL | Status: DC

## 2014-06-09 MED ORDER — SODIUM CHLORIDE 0.9 % IJ SOLN
3.0000 mL | Freq: Two times a day (BID) | INTRAMUSCULAR | Status: DC
  Administered 2014-06-09 – 2014-06-12 (×8): 3 mL via INTRAVENOUS

## 2014-06-09 NOTE — Progress Notes (Signed)
Patient not transferred to 2W d/t going into atrial fib at 1000, Dr. Roxy Manns states to give amiodarone and get heart rate controlled. To move later this afternoon. To keep RIJ sleeve for amiodarone infusion.

## 2014-06-09 NOTE — Progress Notes (Addendum)
[address]       [place] [address]             [phone]      2 Days Post-Op Procedure(s) (LRB): AORTIC VALVE REPLACEMENT  (AVR) with 34mm Aortic Perimount Magna Ease (N/A) INTRAOPERATIVE TRANSESOPHAGEAL ECHOCARDIOGRAM (N/A)  Subjective:  Becky Winters is feeling better this morning.  She is tired, stating she can barely keep her eyes open.  Questioned about pain medication use and states she just took Tylenol.  She denies any further nausea and states she is hungry and wants food.   No BM, +flatus  Objective: Vital signs in last 24 hours: Temp:  [97.6 F (36.4 C)-98.5 F (36.9 C)] 97.7 F (36.5 C) (10/16 0802) Pulse Rate:  [79-92] 79 (10/16 0800) Cardiac Rhythm:  [-] Atrial paced (10/16 0800) Resp:  [10-26] 23 (10/16 0800) BP: (90-125)/(51-75) 125/75 mmHg (10/16 0800) SpO2:  [94 %-100 %] 97 % (10/16 0800) Arterial Line BP: (113)/(44) 113/44 mmHg (10/15 0900) Weight:  [168 lb 10.4 oz (76.5 kg)] 168 lb 10.4 oz (76.5 kg) (10/16 0600)  Intake/Output from previous day: 10/15 0701 - 10/16 0700 In: 480 [P.O.:140; I.V.:190; IV Piggyback:150] Out: 1310 [Urine:1300; Chest Tube:10] Intake/Output this shift: Total I/O In: 10 [I.V.:10] Out: 25 [Urine:25]  General appearance: alert, cooperative and no distress Heart: regular rate and rhythm and remains A paced,  Lungs: clear to auscultation bilaterally Abdomen: soft, non-tender; bowel sounds normal; no masses,  no organomegaly Extremities: edema trace Wound: clean and dry  Lab Results:  Recent Labs  06/08/14 1705 06/08/14 1712 06/09/14 0500  WBC 13.1*  --  12.2*  HGB 10.4* 10.9* 10.5*  HCT 31.7* 32.0* 31.9*  PLT 143*  --  156   BMET:  Recent Labs  06/08/14 0400  06/08/14 1712 06/09/14 0500  NA 142  --  139 138  K 4.0  --  4.0 4.1  CL 110  --  106 104  CO2 19  --   --  23  GLUCOSE 110*  --  107* 113*  BUN 14  --  16 17  CREATININE 0.70  < > 0.70 0.79  CALCIUM 7.9*  --   --  8.5  < > =  values in this interval not displayed.  PT/INR:  Recent Labs  06/07/14 1225  LABPROT 16.4*  INR 1.31   ABG    Component Value Date/Time   PHART 7.360 06/08/2014 0403   HCO3 20.3 06/08/2014 0403   TCO2 19 06/08/2014 1712   ACIDBASEDEF 5.0* 06/08/2014 0403   O2SAT 98.0 06/08/2014 0403   CBG (last 3)   Recent Labs  06/08/14 1931 06/08/14 2326 06/09/14 0325  GLUCAP 148* 110* 120*    Assessment/Plan: S/P Procedure(s) (LRB): AORTIC VALVE REPLACEMENT  (AVR) with 25mm Aortic Perimount Magna Ease (N/A) INTRAOPERATIVE TRANSESOPHAGEAL ECHOCARDIOGRAM (N/A)  1. CV- Remains A Paced, Sinus brady under pacer- hemodynamically stable off all drips, continue to hold Beta Blocker 2. Pulm- wean oxygen as tolerated, CXR with some mild atelectasis, no significant effusions, encouraged use of IS 3. Renal- creatinine, lytes okay, received IV Lasix yesterday, weight is up about 8 lbs, will start oral Lasix 4. GI- nausea resolved, patient requesting food this morning, will start heart healthy diet 5. CBGs- controlled, will d/c fingersticks 6. Dispo- patient looks good, possibly d/c foley today, ambulate, possibly transfer to 2W   LOS: 2 days    Becky Winters, Becky Winters 06/09/2014  I have seen and examined  the patient and agree with the assessment and plan as outlined.  Doing well.  Transfer 2W.  Becky Winters 06/09/2014 9:05 AM   ADDENDUM:  [name] just developed rapid Afib after walk.  Will turn off temporary pacer and start IV amiodarone.  Hold transfer for now - possibly transfer to stepdown later today if her HR is brought under reasonable control and/or she converts back to sinus rhythm.  Becky Winters 06/09/2014 9:46 AM

## 2014-06-09 NOTE — Progress Notes (Signed)
Transferred to 2W23 via [place], O2 and monitor. RN to receive in room, Placed on tele and in bed. SCD's with pt.

## 2014-06-09 NOTE — Progress Notes (Signed)
  Amiodarone Drug - Drug Interaction Consult Note  Recommendations: -On Lipitor, monitor for myalgias  -On Lasix, keep K >4, on Kdur daily x 2days  Amiodarone is metabolized by the cytochrome P450 system and therefore has the potential to cause many drug interactions. Amiodarone has an average plasma half-life of 50 days (range 20 to 100 days).   There is potential for drug interactions to occur several weeks or months after stopping treatment and the onset of drug interactions may be slow after initiating amiodarone.   [x]  Statins: Increased risk of myopathy. Simvastatin- restrict dose to 20mg  daily. Other statins: counsel patients to report any muscle pain or weakness immediately.  []  Anticoagulants: Amiodarone can increase anticoagulant effect. Consider warfarin dose reduction. Patients should be monitored closely and the dose of anticoagulant altered accordingly, remembering that amiodarone levels take several weeks to stabilize.  []  Antiepileptics: Amiodarone can increase plasma concentration of phenytoin, the dose should be reduced. Note that small changes in phenytoin dose can result in large changes in levels. Monitor patient and counsel on signs of toxicity.  []  Beta blockers: increased risk of bradycardia, AV block and myocardial depression. Sotalol - avoid concomitant use.  []   Calcium channel blockers (diltiazem and verapamil): increased risk of bradycardia, AV block and myocardial depression.  []   Cyclosporine: Amiodarone increases levels of cyclosporine. Reduced dose of cyclosporine is recommended.  []  Digoxin dose should be halved when amiodarone is started.  [x]  Diuretics: increased risk of cardiotoxicity if hypokalemia occurs.  []  Oral hypoglycemic agents (glyburide, glipizide, glimepiride): increased risk of hypoglycemia. Patient's glucose levels should be monitored closely when initiating amiodarone therapy.   []  Drugs that prolong the QT interval:  Torsades de pointes  risk may be increased with concurrent use - avoid if possible.  Monitor QTc, also keep magnesium/potassium WNL if concurrent therapy can't be avoided. Marland Kitchen Antibiotics: e.g. fluoroquinolones, erythromycin. . Antiarrhythmics: e.g. quinidine, procainamide, disopyramide, sotalol. . Antipsychotics: e.g. phenothiazines, haloperidol.  . Lithium, tricyclic antidepressants, and methadone.   Thank You,  Harvel Quale  06/09/2014 1:54 PM

## 2014-06-09 NOTE — Progress Notes (Signed)
Report to 2 W RN 

## 2014-06-10 ENCOUNTER — Encounter (HOSPITAL_COMMUNITY): Payer: Self-pay | Admitting: Thoracic Surgery (Cardiothoracic Vascular Surgery)

## 2014-06-10 ENCOUNTER — Inpatient Hospital Stay (HOSPITAL_COMMUNITY): Payer: Medicare Other

## 2014-06-10 LAB — BASIC METABOLIC PANEL
ANION GAP: 13 (ref 5–15)
BUN: 16 mg/dL (ref 6–23)
CHLORIDE: 103 meq/L (ref 96–112)
CO2: 23 meq/L (ref 19–32)
Calcium: 8.5 mg/dL (ref 8.4–10.5)
Creatinine, Ser: 0.76 mg/dL (ref 0.50–1.10)
GFR calc Af Amer: >90 mL/min (ref >90)
GFR calc non Af Amer: 83 mL/min — ABNORMAL LOW (ref >90)
GLUCOSE: 107 mg/dL — AB (ref 70–99)
Potassium: 3.9 mEq/L (ref 3.7–5.3)
SODIUM: 139 meq/L (ref 137–147)

## 2014-06-10 LAB — GLUCOSE, CAPILLARY: GLUCOSE-CAPILLARY: 164 mg/dL — AB (ref 70–99)

## 2014-06-10 LAB — CBC
HCT: 33.5 % — ABNORMAL LOW (ref 36.0–46.0)
HEMOGLOBIN: 11.2 g/dL — AB (ref 12.0–15.0)
MCH: 28.1 pg (ref 26.0–34.0)
MCHC: 33.4 g/dL (ref 30.0–36.0)
MCV: 84 fL (ref 78.0–100.0)
PLATELETS: 178 10*3/uL (ref 150–400)
RBC: 3.99 MIL/uL (ref 3.87–5.11)
RDW: 15.8 % — ABNORMAL HIGH (ref 11.5–15.5)
WBC: 11.3 10*3/uL — AB (ref 4.0–10.5)

## 2014-06-10 MED ORDER — SODIUM CHLORIDE 0.9 % IJ SOLN
10.0000 mL | INTRAMUSCULAR | Status: DC | PRN
  Administered 2014-06-10: 10 mL

## 2014-06-10 MED ORDER — PSYLLIUM 95 % PO PACK
1.0000 | PACK | Freq: Every day | ORAL | Status: DC
  Administered 2014-06-10: 1 via ORAL
  Filled 2014-06-10 (×4): qty 1

## 2014-06-10 MED ORDER — WARFARIN SODIUM 2.5 MG PO TABS
2.5000 mg | ORAL_TABLET | Freq: Every day | ORAL | Status: DC
  Administered 2014-06-10 – 2014-06-12 (×3): 2.5 mg via ORAL
  Filled 2014-06-10 (×5): qty 1

## 2014-06-10 MED ORDER — WARFARIN - PHYSICIAN DOSING INPATIENT
Freq: Every day | Status: DC
Start: 2014-06-11 — End: 2014-06-13
  Administered 2014-06-11 – 2014-06-12 (×2)

## 2014-06-10 MED ORDER — BISACODYL 10 MG RE SUPP
10.0000 mg | Freq: Every day | RECTAL | Status: DC | PRN
  Filled 2014-06-10: qty 1

## 2014-06-10 NOTE — Progress Notes (Signed)
CARDIAC REHAB PHASE I   PRE:  Rate/Rhythm: Sinus 72  BP:  Supine: 102/63     SaO2: 92 Room Air  MODE:  Ambulation: 250 ft   POST:  Rate/Rhythem: Sinus 71  BP:  Supine: 117/69       SaO2: 92 Room Air  Patient complained of feeling nauseated. Ambulated in hallway and continued to complain of nausea and vomited 50 cc's of emesis upon return to the room. Patient assisted back to bed and repositioned. Patient's RN notified. Patient refused to take any anti nausea medicine or ginger ale. Cold compress applied. Patient husband present call bell within reach.  Whitaker, Christa See RN BSN

## 2014-06-10 NOTE — Progress Notes (Addendum)
       [address]       [place] [address]             [phone]          3 Days Post-Op Procedure(s) (LRB): AORTIC VALVE REPLACEMENT  (AVR) with 86mm Aortic Perimount Magna Ease (N/A) INTRAOPERATIVE TRANSESOPHAGEAL ECHOCARDIOGRAM (N/A)  Subjective: Feels heart racing at times. Had some nausea overnight but feeling better at present.   Objective: Vital signs in last 24 hours: Patient Vitals for the past 24 hrs:  BP Temp Temp src Pulse Resp SpO2 Weight  06/10/14 0421 116/68 mmHg 98 F (36.7 C) Oral 93 21 91 % 167 lb 1.6 oz (75.796 kg)  06/09/14 1953 90/60 mmHg 99.1 F (37.3 C) Oral 101 22 93 % -  06/09/14 1427 94/62 mmHg 97.9 F (36.6 C) Oral 98 19 98 % -  06/09/14 1400 109/73 mmHg - - 93 25 97 % -  06/09/14 1300 103/60 mmHg - - 91 27 96 % -  06/09/14 1200 113/63 mmHg - - 85 26 94 % -  06/09/14 1154 - 98 F (36.7 C) Oral 91 25 - -  06/09/14 1100 105/62 mmHg - - 94 27 96 % -  06/09/14 1000 101/61 mmHg - - 99 24 97 % -  06/09/14 0954 123/65 mmHg - - 106 25 97 % -  06/09/14 0939 121/89 mmHg - - 116 25 98 % -  06/09/14 0900 123/69 mmHg - - 80 21 98 % -  06/09/14 0802 - 97.7 F (36.5 C) Oral - - - -  06/09/14 0800 125/75 mmHg - - 79 23 97 % -   Current Weight  06/10/14 167 lb 1.6 oz (75.796 kg)  BASELINE WEIGHT: 73 kg    Intake/Output from previous day: 10/16 0701 - 10/17 0700 In: 733.1 [P.O.:330; I.V.:353.1; IV Piggyback:50] Out: 1310 [Urine:1310]    PHYSICAL EXAM:  Heart: Irr irr, HR around 100 Lungs: Clear Wound: Clean and dry Extremities: No edema   Lab Results: CBC: Recent Labs  06/09/14 0500 06/10/14 0406  WBC 12.2* 11.3*  HGB 10.5* 11.2*  HCT 31.9* 33.5*  PLT 156 178   BMET:  Recent Labs  06/09/14 0500 06/10/14 0406  NA 138 139  K 4.1 3.9  CL 104 103  CO2 23 23  GLUCOSE 113* 107*  BUN 17 16  CREATININE 0.79 0.76  CALCIUM 8.5 8.5    PT/INR:  Recent Labs  06/07/14 1225  LABPROT 16.4*  INR 1.31       Assessment/Plan: S/P Procedure(s) (LRB): AORTIC VALVE REPLACEMENT  (AVR) with 76mm Aortic Perimount Magna Ease (N/A) INTRAOPERATIVE TRANSESOPHAGEAL ECHOCARDIOGRAM (N/A)  CV- AF, on Amio gtt.  BPs remain low normal, so we may not be able to start a beta blocker yet.  Rates 90-100. Continue to watch.  GI- pt has h/o "colitis" at home and takes probiotic and metamucil daily. She is concerned about getting off her regular regimen and becoming constipated, so will resume home meds and watch.  CRPI, pulm toilet.   LOS: 3 days    COLLINS,GINA H 06/10/2014  Start low-dose Coumadin for persistent atrial fibrillation patient examined and medical record reviewed,agree with above note. VAN TRIGT III,PETER 06/10/2014

## 2014-06-11 ENCOUNTER — Inpatient Hospital Stay (HOSPITAL_COMMUNITY): Payer: Medicare Other

## 2014-06-11 LAB — BASIC METABOLIC PANEL
Anion gap: 12 (ref 5–15)
BUN: 15 mg/dL (ref 6–23)
CO2: 25 mEq/L (ref 19–32)
Calcium: 8.1 mg/dL — ABNORMAL LOW (ref 8.4–10.5)
Chloride: 104 mEq/L (ref 96–112)
Creatinine, Ser: 0.76 mg/dL (ref 0.50–1.10)
GFR calc Af Amer: >90 mL/min (ref >90)
GFR calc non Af Amer: 83 mL/min — ABNORMAL LOW (ref >90)
Glucose, Bld: 108 mg/dL — ABNORMAL HIGH (ref 70–99)
Potassium: 3.4 mEq/L — ABNORMAL LOW (ref 3.7–5.3)
Sodium: 141 mEq/L (ref 137–147)

## 2014-06-11 LAB — CBC
HCT: 29.8 % — ABNORMAL LOW (ref 36.0–46.0)
Hemoglobin: 10.1 g/dL — ABNORMAL LOW (ref 12.0–15.0)
MCH: 27.9 pg (ref 26.0–34.0)
MCHC: 33.9 g/dL (ref 30.0–36.0)
MCV: 82.3 fL (ref 78.0–100.0)
Platelets: 175 10*3/uL (ref 150–400)
RBC: 3.62 MIL/uL — ABNORMAL LOW (ref 3.87–5.11)
RDW: 15.9 % — ABNORMAL HIGH (ref 11.5–15.5)
WBC: 10.8 10*3/uL — ABNORMAL HIGH (ref 4.0–10.5)

## 2014-06-11 LAB — PROTIME-INR
INR: 1.26 (ref 0.00–1.49)
Prothrombin Time: 15.9 seconds — ABNORMAL HIGH (ref 11.6–15.2)

## 2014-06-11 MED ORDER — PROMETHAZINE HCL 25 MG/ML IJ SOLN: 6.2500 mg | INTRAMUSCULAR | Status: DC | PRN

## 2014-06-11 NOTE — Progress Notes (Signed)
Removed central line to the rt neck per MD order per hospital policy. Patient tolerated well. Applied Vaseline/Guaze and hyperfix tape to site. Patient reminded to remain in bed for 30 minutes. Will continue to monitor closely. Glade Nurse, RN

## 2014-06-11 NOTE — Progress Notes (Signed)
Pt has ambulated 270ft x2 with husband. Pt did not have any complaints. Will continue to monitor pt closely.

## 2014-06-11 NOTE — Progress Notes (Addendum)
       [address]       [place], [address]             [phone]          4 Days Post-Op Procedure(s) (LRB): AORTIC VALVE REPLACEMENT  (AVR) with 68mm Aortic Perimount Magna Ease (N/A) INTRAOPERATIVE TRANSESOPHAGEAL ECHOCARDIOGRAM (N/A)  Subjective: Feels better today. Back in sinus rhythm, bowels working.  No complaints.   Objective: Vital signs in last 24 hours: Patient Vitals for the past 24 hrs:  BP Temp Temp src Pulse Resp SpO2 Weight  06/11/14 0415 113/52 mmHg 98.5 F (36.9 C) Oral 67 20 92 % 166 lb 8 oz (75.524 kg)  06/10/14 2034 99/50 mmHg 98.8 F (37.1 C) Oral 67 20 92 % -  06/10/14 1343 107/61 mmHg 98.9 F (37.2 C) Oral 69 18 95 % -   Current Weight  06/11/14 166 lb 8 oz (75.524 kg)  BASELINE WEIGHT: 73 kg    Intake/Output from previous day: 10/17 0701 - 10/18 0700 In: 126 [P.O.:120; I.V.:6] Out: -     PHYSICAL EXAM:  Heart: RRR Lungs: Clear Wound: Clean and dry Extremities: No edema    Lab Results: CBC: Recent Labs  06/10/14 0406 06/11/14 0413  WBC 11.3* 10.8*  HGB 11.2* 10.1*  HCT 33.5* 29.8*  PLT 178 175   BMET:  Recent Labs  06/10/14 0406 06/11/14 0413  NA 139 141  K 3.9 3.4*  CL 103 104  CO2 23 25  GLUCOSE 107* 108*  BUN 16 15  CREATININE 0.76 0.76  CALCIUM 8.5 8.1*    PT/INR:  Recent Labs  06/11/14 0413  LABPROT 15.9*  INR 1.26   CXR: FINDINGS:  Status post median sternotomy and aortic valve replacement. The  heart size is normal. Small pleural effusions are stable. No  interstitial edema or airspace consolidation.  IMPRESSION:  1. Stable small bilateral pleural effusions.    Assessment/Plan: S/P Procedure(s) (LRB): AORTIC VALVE REPLACEMENT  (AVR) with 29mm Aortic Perimount Magna Ease (N/A) INTRAOPERATIVE TRANSESOPHAGEAL ECHOCARDIOGRAM (N/A)  CV- AF, now SR. Continue po Amio, Coumadin. BPs remain low normal, so not on a beta blocker.   GI- back on home bowel regimen.    Continue  ambulation, pulm toilet.  Possibly home in am if remains stable.   LOS: 4 days    COLLINS,GINA H 06/11/2014  Looks great- anticipate DC in am patient examined and medical record reviewed,agree with above note. VAN TRIGT III,Jimesha Rising 06/11/2014

## 2014-06-12 ENCOUNTER — Encounter (HOSPITAL_COMMUNITY): Payer: Self-pay | Admitting: Student

## 2014-06-12 LAB — PROTIME-INR
INR: 1.21 (ref 0.00–1.49)
Prothrombin Time: 15.4 seconds — ABNORMAL HIGH (ref 11.6–15.2)

## 2014-06-12 MED ORDER — AMIODARONE HCL 200 MG PO TABS
200.0000 mg | ORAL_TABLET | Freq: Two times a day (BID) | ORAL | Status: DC
  Administered 2014-06-12 – 2014-06-13 (×3): 200 mg via ORAL
  Filled 2014-06-12 (×4): qty 1

## 2014-06-12 MED ORDER — FOLIC ACID 0.5 MG HALF TAB
0.5000 mg | ORAL_TABLET | Freq: Every day | ORAL | Status: DC
  Administered 2014-06-12 – 2014-06-13 (×2): 0.5 mg via ORAL
  Filled 2014-06-12 (×2): qty 1

## 2014-06-12 MED ORDER — ZOLPIDEM TARTRATE 5 MG PO TABS
5.0000 mg | ORAL_TABLET | Freq: Every evening | ORAL | Status: DC | PRN
  Administered 2014-06-12: 5 mg via ORAL
  Filled 2014-06-12: qty 1

## 2014-06-12 MED ORDER — ASPIRIN EC 81 MG PO TBEC
81.0000 mg | DELAYED_RELEASE_TABLET | Freq: Every day | ORAL | Status: DC
  Administered 2014-06-12 – 2014-06-13 (×2): 81 mg via ORAL
  Filled 2014-06-12 (×2): qty 1

## 2014-06-12 MED ORDER — FOLIC ACID 800 MCG PO TABS: 400.0000 ug | ORAL_TABLET | Freq: Every day | ORAL | Status: DC

## 2014-06-12 MED ORDER — DOXYLAMINE SUCCINATE (SLEEP) 25 MG PO TABS
50.0000 mg | ORAL_TABLET | Freq: Every evening | ORAL | Status: DC | PRN
  Filled 2014-06-12: qty 2

## 2014-06-12 MED ORDER — AMIODARONE HCL 200 MG PO TABS
400.0000 mg | ORAL_TABLET | Freq: Two times a day (BID) | ORAL | Status: DC
Start: 2014-06-12 — End: 2014-06-12
  Filled 2014-06-12 (×2): qty 2

## 2014-06-12 NOTE — Progress Notes (Signed)
0840 Pt has already walked this morning with husband. She is disappointed that she might not get to go home. Offered to go over education but pt wants to wait. Will continue to follow. Graylon Good RN BSN 06/12/2014 8:36 AM

## 2014-06-12 NOTE — Progress Notes (Signed)
1020 Observed pt walking with husband with steady gait at least 690 ft. Looked at monitor and heart rate 114 atrial fib. Graylon Good RN BSN 06/12/2014 10:24 AM

## 2014-06-12 NOTE — Progress Notes (Addendum)
       [address]       [place] [address]             [phone]          5 Days Post-Op Procedure(s) (LRB): AORTIC VALVE REPLACEMENT  (AVR) with 85mm Aortic Perimount Magna Ease (N/A) INTRAOPERATIVE TRANSESOPHAGEAL ECHOCARDIOGRAM (N/A)  Subjective: Back in rate controlled AF this am, asymptomatic.  Wants to go home.   Objective: Vital signs in last 24 hours: Patient Vitals for the past 24 hrs:  BP Temp Temp src Pulse Resp SpO2 Weight  06/12/14 0435 93/53 mmHg 98.6 F (37 C) Oral 59 19 94 % 165 lb 4.8 oz (74.98 kg)  06/11/14 2043 95/54 mmHg - - - - - -  06/11/14 2041 90/54 mmHg 98.2 F (36.8 C) Oral 67 21 93 % -  06/11/14 0936 117/61 mmHg 98.1 F (36.7 C) Oral 66 20 94 % -   Current Weight  06/12/14 165 lb 4.8 oz (74.98 kg)  BASELINE WEIGHT: 73 kg    Intake/Output from previous day:      PHYSICAL EXAM:  Heart: Irr irr  Lungs: Clear Wound: Clean and dry Extremities: No edema    Lab Results: CBC: Recent Labs  06/10/14 0406 06/11/14 0413  WBC 11.3* 10.8*  HGB 11.2* 10.1*  HCT 33.5* 29.8*  PLT 178 175   BMET:  Recent Labs  06/10/14 0406 06/11/14 0413  NA 139 141  K 3.9 3.4*  CL 103 104  CO2 23 25  GLUCOSE 107* 108*  BUN 16 15  CREATININE 0.76 0.76  CALCIUM 8.5 8.1*    PT/INR:  Recent Labs  06/12/14 0357  LABPROT 15.4*  INR 1.21      Assessment/Plan: S/P Procedure(s) (LRB): AORTIC VALVE REPLACEMENT  (AVR) with 59mm Aortic Perimount Magna Ease (N/A) INTRAOPERATIVE TRANSESOPHAGEAL ECHOCARDIOGRAM (N/A)  CV- had some bradycardia earlier this am before reverting to rate controlled AF. SBPs remain low, so will not be able to start beta blocker.  She is on Amiodarone 200 mg bid, but will not increase due to bradycardia. Continue Coumadin.  Doing well otherwise.  Home once rhythm issues stable.   LOS: 5 days    COLLINS,GINA H 06/12/2014  I have seen and examined the patient and agree with the assessment and  plan as outlined.  Back in rate-controlled Afib this morning.  Based upon pre-existing history of palpitations I suspect that Mrs Pruitt may have been having PAF prior to admission.  Continue amiodarone and d/c pacing wires.  Continue coumadin.  Low dose aspirin until therapeutic on coumadin.  Possible d/c home tomorrow.  Terrianne Cavness H 06/12/2014 9:04 AM

## 2014-06-12 NOTE — Progress Notes (Signed)
EPW removed per protocol, wires intact patient tolerated well. Instructed bedrest for 1 hour. Routnie vital signs per protocol began. Joylene Draft A

## 2014-06-12 NOTE — Progress Notes (Signed)
45 Pt stated not going home. Wants to walk with husband later. Encouraged pt to try to go a little farther each day. Will follow up tomorrow. Emotional support given as pt disappointed. Stated she is familiar with Coumadin and watching diet as she had family member on it. Graylon Good RN BSN 06/12/2014 9:16 AM

## 2014-06-12 NOTE — Discharge Summary (Signed)
[address]       [place] [address]             [phone]              Discharge Summary  Name: Becky Winters DOB: 09-Jan-1941 73 y.o. MRN: [id number]   Admission Date: 06/07/2014 Discharge Date: 06/13/2014    Admitting Diagnosis: Severe aortic stenosis with bicuspid aortic valve   Discharge Diagnosis:  Severe aortic stenosis with bicuspid aortic valve Postoperative atrial fibrillation  Past Medical History  Diagnosis Date  . Collagenous colitis   . TIA (transient ischemic attack)   . Hypothyroidism   . HLD (hyperlipidemia)   . H/O bicuspid aortic valve   . Aortic stenosis   . Severe aortic stenosis 05/21/2014  . Orthostatic hypotension 05/10/2014  . Bicuspid aortic valve 05/10/2014  . Chronic diastolic congestive heart failure   . Exertional dyspnea 05/10/2014  . Heart murmur   . Anxiety     conditional, taking in preparation for surgery   . GERD (gastroesophageal reflux disease)     prevacid on occasion  . Compression, esophagus 2013    stretched in the past   . Foot swelling     - LEFT-ever since she had an injury as a child   . H/O hiatal hernia   . UTI (urinary tract infection)     frequent UTI-----pt. reports that she takes Cipro if needed, last UTI- July 2015  . 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/07/2014    23 mm [facility] Ease bovine pericardial tissue valve  . Postoperative atrial fibrillation 06/09/2014      Procedures: AORTIC VALVE REPLACEMENT  (43mm Aortic Perimount Magna Ease tissue valve) - 06/07/2014    HPI:  The patient is a 73 y.o. female with history of bicuspid aortic valve and aortic stenosis who was referred to Dr. Roxy Manns for possible elective aortic valve replacement. The patient states that she has a long-standing history of mild exertional shortness of breath. She was noted to have a  heart murmur on physical exam and diagnosed with likely bicuspid aortic valve and mild aortic stenosis in 2012. The patient states that over the past 2-3 months she has developed worsening symptoms of exertional shortness of breath with occasional episodes of transient dizzy spells or lightheadedness with occasional tachypalpitations. Symptoms have progressed such that now the patient gets short of breath with relatively mild activity. She has had increasing dizzy spells without syncope. She has intermittently measured her pulse and blood pressure, and at times she gets dizzy. Her blood pressure typically runs low with systolic pressure in the 38-182 range. She was seen in followup by Dr. Aundra Dubin and transthoracic echocardiogram demonstrated progression of severity of aortic stenosis with peak velocity measured across the aortic valve approximately 4.1 m/s corresponding to a mean transvalvular gradient of 68 and 37 mm mercury, respectively. The calculated aortic valve area arranged between 0.6 and 0.7 cm. Left ventricular systolic function remained normal with ejection fraction estimated 60-65%. MRA of the chest revealed mild ectasia of the ascending thoracic aorta with maximum transverse diameter 3.7-3.8 cm. Left and right heart catheterization revealed nonobstructive coronary artery disease with normal right-sided filling pressures. The patient was referred for surgical consultation. Dr. Roxy Manns saw the patient and reviewed her films and felt that she would benefit from aortic valve  replacement at this time.  All risks, benefits and alternatives of surgery were explained in detail, and the patient agreed to proceed.     Hospital Course:  The patient was admitted to [facility] on 06/07/2014. The patient was taken to the operating room and underwent the above procedure.    The postoperative course has been notable for atrial fibrillation. She was started on IV Amiodarone and converted to sinus rhythm.  She was  switched to po Amiodarone and started on Coumadin.  She had a recurrence of atrial fibrillation on postop day 4, but remained rate controlled.  Her systolic blood pressures have been low normal, so she has not been started on a beta blocker.    The patient is otherwise doing well.  Her incisions are healing well.  She is tolerating a regular diet and ambulating in the hall without difficulty. She has had a mild blood loss anemia but has not required transfusion.  The patient has been evaluated on today's date and is medically stable for discharge home.    Recent vital signs:  Filed Vitals:   06/13/14 0500  BP: 112/59  Pulse: 92  Temp: 98.2 F (36.8 C)  Resp: 17    Recent laboratory studies:  CBC:  Recent Labs  06/11/14 0413  WBC 10.8*  HGB 10.1*  HCT 29.8*  PLT 175   BMET:   Recent Labs  06/11/14 0413  NA 141  K 3.4*  CL 104  CO2 25  GLUCOSE 108*  BUN 15  CREATININE 0.76  CALCIUM 8.1*    PT/INR:   Recent Labs  06/13/14 0453  LABPROT 17.5*  INR 1.42     Discharge Medications:     Medication List         ALIGN PO  Take 1 tablet by mouth daily as needed.     ALPRAZolam 0.25 MG tablet  Commonly known as:  XANAX  Take 1 tablet (0.25 mg total) by mouth at bedtime as needed for anxiety.     amiodarone 200 MG tablet  Commonly known as:  PACERONE  Take 1 tablet (200 mg total) by mouth 2 (two) times daily.     aspirin 81 MG EC tablet  Take 1 tablet (81 mg total) by mouth daily.     atorvastatin 40 MG tablet  Commonly known as:  LIPITOR  Take 20 mg by mouth at bedtime.     b complex vitamins capsule  Take 1 capsule by mouth daily.     beta carotene w/minerals tablet  Take 1 tablet by mouth daily.     multivitamin with minerals tablet  Take 1 tablet by mouth daily.     HAIR/SKIN/NAILS PO  Take 1 tablet by mouth daily.     Cinnamon 500 MG Tabs  Take 500 mg by mouth daily.     co-enzyme Q-10 50 MG capsule  Take 100 mg by mouth 2 (two) times  daily.     folic acid 035 MCG tablet  Commonly known as:  FOLVITE  Take 400 mcg by mouth daily.     levothyroxine 125 MCG tablet  Commonly known as:  SYNTHROID, LEVOTHROID  Take 125 mcg by mouth daily. Not on sundays     loperamide 2 MG capsule  Commonly known as:  IMODIUM  Take 2 mg by mouth as needed for diarrhea or loose stools.     Melatonin 3 MG Tabs  Take 1 tablet by mouth daily.     Olive Leaf  Extract 500 MG Caps  Take 500 mg by mouth daily.     psyllium 58.6 % powder  Commonly known as:  METAMUCIL  Take 1 packet by mouth daily as needed.     SLEEP AID 25 MG tablet  Generic drug:  doxylamine (Sleep)  Take 50 mg by mouth at bedtime as needed for sleep.     traMADol 50 MG tablet  Commonly known as:  ULTRAM  Take 1 tablet (50 mg total) by mouth every 6 (six) hours as needed for moderate pain.     Vitamin D3 1000 UNITS Caps  Take 1,000 Units by mouth daily.     warfarin 2.5 MG tablet  Commonly known as:  COUMADIN  Take 2.5 mg po daily or as directed by Coumadin Clinic         Discharge Instructions:  The patient is to refrain from driving, heavy lifting or strenuous activity.  May shower daily and clean incisions with soap and water.  May resume regular diet.   Follow Up:    Follow-up Information   Follow up with Rexene Alberts, MD On 07/03/2014. (Have a chest x-ray at [facility] at 3:00, then see MD at 4:00)    Specialty:  Cardiothoracic Surgery   Contact information:   [place] [address] [phone]       Follow up with [facility] On 06/15/2014. (Coumadin Clinic 10/22 @10am  )    Specialty:  Cardiology   Contact information:   [address], [place] [address] [phone]      Follow up with Richardson Dopp, PA-C On 07/10/2014. Richardson Dopp, [place] 11/6 @11 :30am  )    Specialty:  Physician Assistant   Contact information:   [address] [place]  [place] [address] [phone]      The patient has been discharged on:  1.Beta Blocker: Yes [  ]  No [ x ]  If No, reason: Low systolic BPs   2.Ace Inhibitor/ARB: Yes [ ]   No [ x ]  If No, reason: Low systolic BPs, no CAD or HTN   3.Statin: Yes [ x ]  No [ ]   If No, reason:    4.Ecasa: Yes [ x ]  No [ ]   If No, reason:   COLLINS,GINA H 06/13/2014, 8:10 AM

## 2014-06-13 ENCOUNTER — Encounter (HOSPITAL_COMMUNITY): Payer: Self-pay | Admitting: Thoracic Surgery (Cardiothoracic Vascular Surgery)

## 2014-06-13 LAB — PROTIME-INR
INR: 1.42 (ref 0.00–1.49)
Prothrombin Time: 17.5 seconds — ABNORMAL HIGH (ref 11.6–15.2)

## 2014-06-13 MED ORDER — WARFARIN SODIUM 2.5 MG PO TABS: ORAL_TABLET | ORAL | Status: DC

## 2014-06-13 MED ORDER — TRAMADOL HCL 50 MG PO TABS
50.0000 mg | ORAL_TABLET | Freq: Four times a day (QID) | ORAL | Status: DC | PRN
Start: 2014-06-13 — End: 2014-06-30

## 2014-06-13 MED ORDER — ASPIRIN 81 MG PO TBEC: 81.0000 mg | DELAYED_RELEASE_TABLET | Freq: Every day | ORAL | Status: DC

## 2014-06-13 MED ORDER — AMIODARONE HCL 200 MG PO TABS: 200.0000 mg | ORAL_TABLET | Freq: Two times a day (BID) | ORAL | Status: DC

## 2014-06-13 NOTE — Progress Notes (Signed)
Pt discharge home with husband. Called Stefani Dama to inform her that their was no order for CT sutures removal. Appointment will be made with office, and office will call pt to remove CTS. Discharge information reviewed and prescriptions given. Pt and husband VU.

## 2014-06-13 NOTE — Progress Notes (Signed)
[id number] Cardiac Rehab Completed discharge education with pt and husband. They voice understanding. Pt agrees to [facility]. CRP in [place], will send referral. Deon Pilling, RN 06/13/2014 10:29 AM

## 2014-06-13 NOTE — Care Management Note (Signed)
    Page 1 of 1   06/13/2014     12:13:32 PM CARE MANAGEMENT NOTE 06/13/2014  Patient:  Becky Winters, Becky Winters   Account Number:  [id number]  Date Initiated:  06/12/2014  Documentation initiated by:  Ondre Salvetti  Subjective/Objective Assessment:   Pt s/p AVR on 06/07/14.  PTA, pt independent, lives with husband.     Action/Plan:   Pt for dc on 06/13/14.  Husband to provide care at dc; states has RW at home if needed.   Anticipated DC Date:  06/13/2014   Anticipated DC Plan:  [place]  CM consult      Choice offered to / List presented to:             Status of service:  Completed, signed off Medicare Important Message given?  YES (If response is "NO", the following Medicare IM given date fields will be blank) Date Medicare IM given:  06/12/2014 Medicare IM given by:  Sakari Raisanen Date Additional Medicare IM given:   Additional Medicare IM given by:    Discharge Disposition:  HOME/SELF CARE  Per UR Regulation:  Reviewed for med. necessity/level of care/duration of stay  If discussed at [place] of Stay Meetings, dates discussed:   06/13/2014    Comments:

## 2014-06-13 NOTE — Progress Notes (Addendum)
       [address]       [place] [address]             [phone]          6 Days Post-Op Procedure(s) (LRB): AORTIC VALVE REPLACEMENT  (AVR) with 74mm Aortic Perimount Magna Ease (N/A) INTRAOPERATIVE TRANSESOPHAGEAL ECHOCARDIOGRAM (N/A)  Subjective: Brief run AF overnight but has converted back to sinus rhythm.    Objective: Vital signs in last 24 hours: Patient Vitals for the past 24 hrs:  BP Temp Temp src Pulse Resp SpO2 Height Weight  06/13/14 0500 112/59 mmHg 98.2 F (36.8 C) Oral 92 17 95 % 5\' 5"  (1.651 m) 164 lb 6.4 oz (74.571 kg)  06/12/14 1957 94/59 mmHg 98.2 F (36.8 C) Oral 89 18 95 % - -  06/12/14 1318 99/60 mmHg 98.6 F (37 C) Oral 75 18 93 % - -  06/12/14 1259 91/64 mmHg - - 89 18 92 % - -  06/12/14 1243 96/59 mmHg - - 85 18 93 % - -  06/12/14 1230 90/61 mmHg - - 88 18 95 % - -  06/12/14 1211 97/59 mmHg - - 87 18 95 % - -   Current Weight  06/13/14 164 lb 6.4 oz (74.571 kg)  BASELINE WEIGHT: 73 kg    Intake/Output from previous day: 10/19 0701 - 10/20 0700 In: 720 [P.O.:720] Out: -     PHYSICAL EXAM:  Heart: RRR Lungs: Clear Wound: Clean and dry Extremities: No edema    Lab Results: CBC: Recent Labs  06/11/14 0413  WBC 10.8*  HGB 10.1*  HCT 29.8*  PLT 175   BMET:  Recent Labs  06/11/14 0413  NA 141  K 3.4*  CL 104  CO2 25  GLUCOSE 108*  BUN 15  CREATININE 0.76  CALCIUM 8.1*    PT/INR:  Recent Labs  06/13/14 0453  LABPROT 17.5*  INR 1.42      Assessment/Plan: S/P Procedure(s) (LRB): AORTIC VALVE REPLACEMENT  (AVR) with 16mm Aortic Perimount Magna Ease (N/A) INTRAOPERATIVE TRANSESOPHAGEAL ECHOCARDIOGRAM (N/A)  CV- brief episode AF overnight, but now back in SR.  INR trending up.  Continue low dose Amio, Coumadin.  Stable otherwise.  Plan d/c home today with outpatient monitoring of INR.   LOS: 6 days    COLLINS,GINA H 06/13/2014  I have seen and examined the patient and agree with  the assessment and plan as outlined.  D/C home today.  Instructions given.  Gavina Dildine H 06/13/2014 8:17 AM

## 2014-06-15 ENCOUNTER — Ambulatory Visit (INDEPENDENT_AMBULATORY_CARE_PROVIDER_SITE_OTHER): Payer: Medicare Other | Admitting: Pharmacist Clinician (PhC)/ Clinical Pharmacy Specialist

## 2014-06-15 DIAGNOSIS — Q231 Congenital insufficiency of aortic valve: Secondary | ICD-10-CM

## 2014-06-15 DIAGNOSIS — I9789 Other postprocedural complications and disorders of the circulatory system, not elsewhere classified: Secondary | ICD-10-CM | POA: Diagnosis not present

## 2014-06-15 DIAGNOSIS — Z953 Presence of xenogenic heart valve: Secondary | ICD-10-CM

## 2014-06-15 DIAGNOSIS — Z5181 Encounter for therapeutic drug level monitoring: Secondary | ICD-10-CM

## 2014-06-15 DIAGNOSIS — Z954 Presence of other heart-valve replacement: Secondary | ICD-10-CM

## 2014-06-15 DIAGNOSIS — I4891 Unspecified atrial fibrillation: Secondary | ICD-10-CM

## 2014-06-15 HISTORY — DX: Encounter for therapeutic drug level monitoring: Z51.81

## 2014-06-15 LAB — POCT INR: INR: 2.3

## 2014-06-15 NOTE — Patient Instructions (Signed)

## 2014-06-19 ENCOUNTER — Ambulatory Visit (INDEPENDENT_AMBULATORY_CARE_PROVIDER_SITE_OTHER): Payer: Medicare Other | Admitting: *Deleted

## 2014-06-19 DIAGNOSIS — Z954 Presence of other heart-valve replacement: Secondary | ICD-10-CM | POA: Diagnosis not present

## 2014-06-19 DIAGNOSIS — Z953 Presence of xenogenic heart valve: Secondary | ICD-10-CM

## 2014-06-19 DIAGNOSIS — Q231 Congenital insufficiency of aortic valve: Secondary | ICD-10-CM

## 2014-06-19 DIAGNOSIS — I4891 Unspecified atrial fibrillation: Secondary | ICD-10-CM

## 2014-06-19 DIAGNOSIS — Z5181 Encounter for therapeutic drug level monitoring: Secondary | ICD-10-CM

## 2014-06-19 DIAGNOSIS — I9789 Other postprocedural complications and disorders of the circulatory system, not elsewhere classified: Secondary | ICD-10-CM | POA: Diagnosis not present

## 2014-06-19 LAB — POCT INR: INR: 3.3

## 2014-06-20 ENCOUNTER — Encounter (INDEPENDENT_AMBULATORY_CARE_PROVIDER_SITE_OTHER): Payer: Self-pay

## 2014-06-20 DIAGNOSIS — I25118 Atherosclerotic heart disease of native coronary artery with other forms of angina pectoris: Secondary | ICD-10-CM

## 2014-06-23 ENCOUNTER — Telehealth: Payer: Self-pay

## 2014-06-23 ENCOUNTER — Ambulatory Visit (INDEPENDENT_AMBULATORY_CARE_PROVIDER_SITE_OTHER): Payer: Medicare Other | Admitting: *Deleted

## 2014-06-23 DIAGNOSIS — I9789 Other postprocedural complications and disorders of the circulatory system, not elsewhere classified: Secondary | ICD-10-CM | POA: Diagnosis not present

## 2014-06-23 DIAGNOSIS — Z5181 Encounter for therapeutic drug level monitoring: Secondary | ICD-10-CM | POA: Diagnosis not present

## 2014-06-23 DIAGNOSIS — Q231 Congenital insufficiency of aortic valve: Secondary | ICD-10-CM

## 2014-06-23 DIAGNOSIS — Z954 Presence of other heart-valve replacement: Secondary | ICD-10-CM

## 2014-06-23 DIAGNOSIS — I4891 Unspecified atrial fibrillation: Secondary | ICD-10-CM

## 2014-06-23 DIAGNOSIS — Z953 Presence of xenogenic heart valve: Secondary | ICD-10-CM

## 2014-06-23 LAB — POCT INR: INR: 3.7

## 2014-06-25 NOTE — Telephone Encounter (Signed)
That would be fine 

## 2014-06-26 ENCOUNTER — Other Ambulatory Visit: Payer: Self-pay

## 2014-06-26 MED ORDER — ALPRAZOLAM 0.25 MG PO TABS: 0.2500 mg | ORAL_TABLET | Freq: Every evening | ORAL | Status: DC | PRN

## 2014-06-30 ENCOUNTER — Other Ambulatory Visit: Payer: Self-pay | Admitting: Thoracic Surgery (Cardiothoracic Vascular Surgery)

## 2014-06-30 ENCOUNTER — Ambulatory Visit (INDEPENDENT_AMBULATORY_CARE_PROVIDER_SITE_OTHER): Payer: Medicare Other | Admitting: Physician Assistant

## 2014-06-30 ENCOUNTER — Encounter: Payer: Self-pay | Admitting: Physician Assistant

## 2014-06-30 ENCOUNTER — Ambulatory Visit (INDEPENDENT_AMBULATORY_CARE_PROVIDER_SITE_OTHER): Payer: Medicare Other | Admitting: Pharmacist

## 2014-06-30 VITALS — BP 138/80 | HR 59 | Ht 64.5 in | Wt 160.0 lb

## 2014-06-30 DIAGNOSIS — Z954 Presence of other heart-valve replacement: Secondary | ICD-10-CM

## 2014-06-30 DIAGNOSIS — Z5181 Encounter for therapeutic drug level monitoring: Secondary | ICD-10-CM | POA: Diagnosis not present

## 2014-06-30 DIAGNOSIS — Z953 Presence of xenogenic heart valve: Secondary | ICD-10-CM

## 2014-06-30 DIAGNOSIS — Z952 Presence of prosthetic heart valve: Secondary | ICD-10-CM

## 2014-06-30 DIAGNOSIS — E785 Hyperlipidemia, unspecified: Secondary | ICD-10-CM

## 2014-06-30 DIAGNOSIS — I4891 Unspecified atrial fibrillation: Secondary | ICD-10-CM

## 2014-06-30 DIAGNOSIS — Q231 Congenital insufficiency of aortic valve: Secondary | ICD-10-CM | POA: Diagnosis not present

## 2014-06-30 DIAGNOSIS — I2510 Atherosclerotic heart disease of native coronary artery without angina pectoris: Secondary | ICD-10-CM

## 2014-06-30 DIAGNOSIS — I350 Nonrheumatic aortic (valve) stenosis: Secondary | ICD-10-CM | POA: Diagnosis not present

## 2014-06-30 DIAGNOSIS — I359 Nonrheumatic aortic valve disorder, unspecified: Secondary | ICD-10-CM

## 2014-06-30 DIAGNOSIS — I9789 Other postprocedural complications and disorders of the circulatory system, not elsewhere classified: Secondary | ICD-10-CM

## 2014-06-30 DIAGNOSIS — Z79899 Other long term (current) drug therapy: Secondary | ICD-10-CM | POA: Diagnosis not present

## 2014-06-30 LAB — POCT INR: INR: 3.5

## 2014-06-30 MED ORDER — AMIODARONE HCL 200 MG PO TABS: 200.0000 mg | ORAL_TABLET | Freq: Every day | ORAL | Status: DC

## 2014-06-30 NOTE — Patient Instructions (Signed)
DECREASE AMIODARONE TO 200 MG DAILY   Your physician has requested that you have an echocardiogram. Echocardiography is a painless test that uses sound waves to create images of your heart. It provides your doctor with information about the size and shape of your heart and how well your heart's chambers and valves are working. This procedure takes approximately one hour. There are no restrictions for this procedure.  You have been referred to [facility] physician recommends that you schedule a follow-up appointment in: [place], [place]; YOU WILL NEED LAB WORK THAT DAY ... TSH, LFT

## 2014-06-30 NOTE — Progress Notes (Signed)
Cardiology Office Note   Date:  06/30/2014   ID:  Winters, Becky 04-11-1941, MRN [id number]  PCP:  Sheela Stack, MD  Cardiologist:  Dr. Loralie Champagne     History of Present Illness: Becky Winters is a 73 y.o. female with a hx of aortic stenosis in the setting of bicuspid aortic valve.  She was noted to have worsening AV gradients on her echo in 04/2014 c/w severe AS and associated symptoms of DOE and lightheadedness.  She was referred for aortic valve replacement. Cardiac catheterization demonstrated no significant CAD. She underwent bioprosthetic AVR 06/07/14 with Dr. Roxy Manns. Postoperative course was complicated by atrial fibrillation controlled on amiodarone. She had recurrent episodes of atrial fibrillation the hospital. She noted symptoms of palpitations prior to her valve surgery. It was suspected that she was having paroxysmal atrial fibrillation prior to admission.  She was placed on Coumadin. She returns for follow-up.  She is doing well. She remains somewhat weak. Her chest is sore. She is improving every day. She is walking 15 minutes 3 times a day. She denies significant dyspnea. She denies orthopnea, PND or edema. She denies syncope.   Studies:  - LHC (10/15):  pCFX 40%  - Echo (9/15):  Mild LVH, EF 60-65%, no RWMA, Gr 1 DD, bicuspid AV, severe AS (mean 37 mmHg), mild TR, PASP 27 mmHg  - Carotid US (10/15):  Bilateral ICA 1-39%   Recent Labs/Images:  05/09/2014: Pro B Natriuretic peptide (BNP) 53.0 06/05/2014: ALT 11 06/11/2014: BUN 15; Creatinine 0.76; Hemoglobin 10.1*; Potassium 3.4*; Sodium 141      Wt Readings from Last 3 Encounters:  06/13/14 164 lb 6.4 oz (74.571 kg)  06/05/14 161 lb 12.8 oz (73.392 kg)  05/29/14 160 lb (72.576 kg)     Past Medical History: 1. Collagenous colitis 2. TIA 3. Hypothyroidism 4. Hyperlipidemia 5. Cardiolite in 2003 and 2006 normal.  6. Esophageal stricture. 7. Bicuspid aortic valve: Diagnosed on 4/12 echo with  mild AS. Repeat echo (9/15) with EF 60-65%, bicuspid aortic valve with severe AS (mean gradient 37 mmHg, peak gradient 68 mmHg, AVA 0.6-0.7 cm^2). MRA chest (9/15) with 3.8 cm ascending aorta. S/p 23 mm Aortic Perimount Magna Ease tissue valve 06/07/14 (Dr. Roxy Manns) 8. Cholecystectomy 9. TAH 10. Appendectomy Past Medical History  Diagnosis Date  . Collagenous colitis   . TIA (transient ischemic attack)   . Hypothyroidism   . HLD (hyperlipidemia)   . H/O bicuspid aortic valve   . Aortic stenosis   . Severe aortic stenosis 05/21/2014  . Orthostatic hypotension 05/10/2014  . Bicuspid aortic valve 05/10/2014  . Chronic diastolic congestive heart failure   . Exertional dyspnea 05/10/2014  . Heart murmur   . Anxiety     conditional, taking in preparation for surgery   . GERD (gastroesophageal reflux disease)     prevacid on occasion  . Compression, esophagus 2013    stretched in the past   . Foot swelling     - LEFT-ever since she had an injury as a child   . H/O hiatal hernia   . UTI (urinary tract infection)     frequent UTI-----pt. reports that she takes Cipro if needed, last UTI- July 2015  . 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/07/2014    23 mm [facility] Ease bovine pericardial tissue valve  . Postoperative atrial  fibrillation 06/09/2014    Current Outpatient Prescriptions  Medication Sig Dispense Refill  . ALPRAZolam (XANAX) 0.25 MG tablet Take 1 tablet (0.25 mg total) by mouth at bedtime as needed for anxiety. 30 tablet 0  . amiodarone (PACERONE) 200 MG tablet Take 1 tablet (200 mg total) by mouth 2 (two) times daily. 60 tablet 1  . aspirin EC 81 MG EC tablet Take 1 tablet (81 mg total) by mouth daily.    Marland Kitchen atorvastatin (LIPITOR) 40 MG tablet Take 20 mg by mouth at bedtime.     Marland Kitchen b complex vitamins capsule Take 1 capsule by mouth daily.    . beta carotene w/minerals  (OCUVITE) tablet Take 1 tablet by mouth daily.    . Cholecalciferol (VITAMIN D3) 1000 UNITS CAPS Take 1,000 Units by mouth daily.     . Cinnamon 500 MG TABS Take 500 mg by mouth daily.     Marland Kitchen co-enzyme Q-10 50 MG capsule Take 100 mg by mouth 2 (two) times daily.    Marland Kitchen doxylamine, Sleep, (SLEEP AID) 25 MG tablet Take 50 mg by mouth at bedtime as needed for sleep.     . folic acid (FOLVITE) 287 MCG tablet Take 400 mcg by mouth daily.    Marland Kitchen levothyroxine (SYNTHROID, LEVOTHROID) 125 MCG tablet Take 125 mcg by mouth daily. Not on sundays    . loperamide (IMODIUM) 2 MG capsule Take 2 mg by mouth as needed for diarrhea or loose stools.    . Melatonin 3 MG TABS Take 1 tablet by mouth daily.     . Multiple Vitamins-Minerals (HAIR/SKIN/NAILS PO) Take 1 tablet by mouth daily.     . Multiple Vitamins-Minerals (MULTIVITAMIN WITH MINERALS) tablet Take 1 tablet by mouth daily.    Jonna Coup Leaf Extract 500 MG CAPS Take 500 mg by mouth daily.     . Probiotic Product (ALIGN PO) Take 1 tablet by mouth daily as needed.     . psyllium (METAMUCIL) 58.6 % powder Take 1 packet by mouth daily as needed.    . traMADol (ULTRAM) 50 MG tablet Take 1 tablet (50 mg total) by mouth every 6 (six) hours as needed for moderate pain. 30 tablet 0  . warfarin (COUMADIN) 2.5 MG tablet Take 2.5 mg po daily or as directed by Coumadin Clinic 30 tablet 1   No current facility-administered medications for this visit.     Allergies:   Asacol; Codeine; Pentasa; Amoxicillin; Clindamycin/lincomycin; and Sulfur   Social History:  The patient  reports that she has never smoked. She does not have any smokeless tobacco history on file. She reports that she does not drink alcohol or use illicit drugs.   Family History:  The patient's family history includes CAD (age of onset: 67) in her father; CVA in an other family member.   ROS:  Please see the history of present illness.       All other systems reviewed and negative.    PHYSICAL  EXAM: VS:  BP 138/80 mmHg  Pulse 59  Ht 5' 4.5" (1.638 m)  Wt 160 lb (72.576 kg)  BMI 27.05 kg/m2 Well nourished, well developed, in no acute distress HEENT: normal Neck:  no JVD Cardiac:  normal S1, S2;  RRR; no murmur Chest:  Median sternotomy well healed without erythema or discharge Lungs:   clear to auscultation bilaterally, no wheezing, rhonchi or rales Abd: soft, nontender, no hepatomegaly Ext:  no edema Skin: warm and dry Neuro:  CNs 2-12 intact, no focal  abnormalities noted  EKG:  Sinus bradycardia, HR 59, normal axis, nonspecific ST-T wave changes      ASSESSMENT AND PLAN:  1.  Severe aortic stenosis S/P AVR:  She is doing well after recent aortic valve replacement.    -  Refer for cardiac rehabilitation.    -  Arrange follow-up echocardiogram.    -  She understands the importance of SBE prophylaxis. She has multiple drug allergies. She will need either Keflex or Zithromax. 2.  Postoperative atrial fibrillation:  She may have been having paroxysmal atrial fibrillation prior to her surgery.  CHADS2-VASc=3 (female, CAD, age >63).  She will likely need long term Coumadin.  Continue Amiodarone for now.  We could consider stopping this at some point.    -  Decrease Amiodarone to 200 QD.    -  Arrange FU LFTs and TSH in 6 weeks.   3.  Coronary artery disease:  Non-obs CAD by recent cath.  Continue ASA, statin.  4.  HLD (hyperlipidemia):  Continue statin.    Disposition:   FU with Dr. Loralie Champagne 6 weeks.   Signed, Versie Starks, MHS 06/30/2014 8:54 AM    [facility] [place], [place], [place]  [address] Phone: [phone]; Fax: [phone]

## 2014-07-03 ENCOUNTER — Ambulatory Visit
Admission: RE | Admit: 2014-07-03 | Discharge: 2014-07-03 | Disposition: A | Discharge status: Home / Self Care | Payer: Medicare Other | Source: Ambulatory Visit | Attending: Thoracic Surgery (Cardiothoracic Vascular Surgery) | Admitting: Thoracic Surgery (Cardiothoracic Vascular Surgery)

## 2014-07-03 ENCOUNTER — Encounter: Payer: Self-pay | Admitting: Thoracic Surgery (Cardiothoracic Vascular Surgery)

## 2014-07-03 ENCOUNTER — Ambulatory Visit (INDEPENDENT_AMBULATORY_CARE_PROVIDER_SITE_OTHER): Payer: Self-pay | Admitting: Thoracic Surgery (Cardiothoracic Vascular Surgery)

## 2014-07-03 VITALS — BP 138/72 | HR 85 | Resp 16 | Ht 65.0 in | Wt 157.0 lb

## 2014-07-03 DIAGNOSIS — I9789 Other postprocedural complications and disorders of the circulatory system, not elsewhere classified: Secondary | ICD-10-CM

## 2014-07-03 DIAGNOSIS — Z953 Presence of xenogenic heart valve: Secondary | ICD-10-CM

## 2014-07-03 DIAGNOSIS — Q231 Congenital insufficiency of aortic valve: Secondary | ICD-10-CM

## 2014-07-03 DIAGNOSIS — I4891 Unspecified atrial fibrillation: Secondary | ICD-10-CM

## 2014-07-03 DIAGNOSIS — I350 Nonrheumatic aortic (valve) stenosis: Secondary | ICD-10-CM

## 2014-07-03 DIAGNOSIS — Z952 Presence of prosthetic heart valve: Secondary | ICD-10-CM | POA: Diagnosis not present

## 2014-07-03 DIAGNOSIS — Z954 Presence of other heart-valve replacement: Secondary | ICD-10-CM

## 2014-07-03 NOTE — Patient Instructions (Signed)
The patient should continue to avoid any heavy lifting or strenuous use of arms or shoulders for at least a total of three months from the time of surgery.  The patient is encouraged to enroll and participate in the outpatient cardiac rehab program beginning as soon as practical.  Stop taking aspirin while you are taking coumadin (warfarin)   Endocarditis is a potentially serious infection of heart valves or inside lining of the heart.  It occurs more commonly in patients with diseased heart valves (such as patient's with aortic or mitral valve disease) and in patients who have undergone heart valve repair or replacement.  Certain surgical and dental procedures may put you at risk, such as dental cleaning, other dental procedures, or any surgery involving the respiratory, urinary, gastrointestinal tract, gallbladder or prostate gland.   To minimize your chances for develooping endocarditis, maintain good oral health and seek prompt medical attention for any infections involving the mouth, teeth, gums, skin or urinary tract.  Always notify your doctor or dentist about your underlying heart valve condition before having any invasive procedures. You will need to take antibiotics before certain procedures.

## 2014-07-03 NOTE — Progress Notes (Addendum)
[address]       [place] [address]             [phone]     CARDIOTHORACIC SURGERY OFFICE NOTE  Referring Provider is Larey Dresser, MD PCP is Sheela Stack, MD   HPI:  Patient returns for routine follow-up status post aortic valve replacement using a bioprosthetic tissue valve on 06/07/2014.  Her early postoperative course in the hospital was notable for the development of paroxysmal atrial fibrillation. In retrospect, the patient states that she had been having similar tachypalpitations for quite some time prior to surgery, although she had never previously been diagnosed with atrial fibrillation. Postoperatively she was treated with amiodarone and converted to sinus rhythm. She was placed on long-term anticoagulation using low-dose aspirin and warfarin.  She was discharged from the hospital on the sixth postoperative day. Since then she has been seen in follow-up by Richardson Dopp at [facility] on 06/30/2014 at which time she remained in sinus rhythm.  She returns to our office for routine follow-up today.  She reports that she is doing very well. Her only complaint is that of generalized weakness which continues to gradually improve. She is not having any pain in her chest whatsoever. She denies any symptoms of exertional shortness of breath. She is not having tachypalpitations or other symptoms to suggest a recurrence of atrial fibrillation. Overall she is making excellent progress.  Her prothrombin time and Coumadin dosing is being monitored through the Coumadin clinic, and her most recent prothrombin time was high therapeutic with INR measured 3.5 on 06/30/2014.   Current Outpatient Prescriptions  Medication Sig Dispense Refill  . amiodarone (PACERONE) 200 MG tablet Take 1 tablet (200 mg total) by mouth daily.    Marland Kitchen aspirin EC 81 MG EC tablet Take 1 tablet (81 mg total) by mouth daily.    Marland Kitchen atorvastatin (LIPITOR) 40 MG tablet Take 20 mg by mouth at  bedtime.     . beta carotene w/minerals (OCUVITE) tablet Take 1 tablet by mouth 2 (two) times daily.     Marland Kitchen co-enzyme Q-10 50 MG capsule Take 100 mg by mouth 2 (two) times daily.    Marland Kitchen doxylamine, Sleep, (SLEEP AID) 25 MG tablet Take 50 mg by mouth at bedtime as needed for sleep.     . folic acid (FOLVITE) 322 MCG tablet Take 800 mcg by mouth daily.     Marland Kitchen levothyroxine (SYNTHROID, LEVOTHROID) 125 MCG tablet Take 125 mcg by mouth daily. Not on sundays    . Melatonin 3 MG TABS Take 1 tablet by mouth daily.     . Multiple Vitamins-Minerals (HAIR/SKIN/NAILS PO) Take 1 tablet by mouth daily.     Marland Kitchen warfarin (COUMADIN) 2.5 MG tablet Take 2.5 mg po daily or as directed by Coumadin Clinic 30 tablet 1   No current facility-administered medications for this visit.      Physical Exam:   BP 138/72 mmHg  Pulse 85  Resp 16  Ht 5\' 5"  (1.651 m)  Wt 71.215 kg (157 lb)  BMI 26.13 kg/m2  SpO2 98%  General:  Well-appearing  Chest:   Clear  CV:   Regular rate and rhythm without murmur  Incisions:  Healing nicely, sternum is stable  Abdomen:  Soft and nontender  Extremities:  Warm and well-perfused  Diagnostic Tests:  CHEST 2 VIEW  COMPARISON: 06/11/2014  FINDINGS: Sternotomy wires and prosthetic aortic valve unchanged. Lungs are adequately inflated without consolidation or  effusion. Cardiomediastinal silhouette and remainder the exam is unchanged.  IMPRESSION: No active cardiopulmonary disease.   Electronically Signed  By: Marin Olp M.D.  On: 07/03/2014 15:33   Impression:  Patient is doing very well less than 1 month following aortic valve replacement using a bioprosthetic tissue valve.   Plan:  I've encouraged the patient to continue to gradually increase her physical activity as tolerated with her primary limitation remaining that she refrain from any sort of heavy lifting or strenuous use of her arms or shoulders for at least another 2 months. I have encouraged her  to get started in the outpatient cardiac rehabilitation program. Now that she is therapeutic on Coumadin I suggested that she might stop taking aspirin. Otherwise, we have not made any changes to her current medications.  If the patient continues to maintain sinus rhythm it might be reasonable to consider stopping amiodarone in 4-6 weeks.  The patient will return for routine follow-up and rhythm check in 2 months.   Valentina Gu. Roxy Manns, MD 07/03/2014 4:00 PM

## 2014-07-07 ENCOUNTER — Ambulatory Visit (INDEPENDENT_AMBULATORY_CARE_PROVIDER_SITE_OTHER): Payer: Medicare Other | Admitting: *Deleted

## 2014-07-07 DIAGNOSIS — Z954 Presence of other heart-valve replacement: Secondary | ICD-10-CM | POA: Diagnosis not present

## 2014-07-07 DIAGNOSIS — Q231 Congenital insufficiency of aortic valve: Secondary | ICD-10-CM | POA: Diagnosis not present

## 2014-07-07 DIAGNOSIS — Z5181 Encounter for therapeutic drug level monitoring: Secondary | ICD-10-CM | POA: Diagnosis not present

## 2014-07-07 DIAGNOSIS — I9789 Other postprocedural complications and disorders of the circulatory system, not elsewhere classified: Secondary | ICD-10-CM | POA: Diagnosis not present

## 2014-07-07 DIAGNOSIS — I4891 Unspecified atrial fibrillation: Secondary | ICD-10-CM | POA: Diagnosis not present

## 2014-07-07 DIAGNOSIS — Z953 Presence of xenogenic heart valve: Secondary | ICD-10-CM

## 2014-07-07 LAB — POCT INR: INR: 2.8

## 2014-07-13 ENCOUNTER — Emergency Department (HOSPITAL_COMMUNITY)
Admission: EM | Admit: 2014-07-13 | Discharge: 2014-07-13 | Disposition: A | Discharge status: Home / Self Care | Payer: Medicare Other | Attending: Emergency Medicine | Admitting: Emergency Medicine

## 2014-07-13 ENCOUNTER — Encounter (HOSPITAL_COMMUNITY): Payer: Self-pay | Admitting: Emergency Medicine

## 2014-07-13 ENCOUNTER — Encounter (HOSPITAL_COMMUNITY)
Admission: RE | Admit: 2014-07-13 | Discharge: 2014-07-13 | Disposition: A | Discharge status: Home / Self Care | Payer: Medicare Other | Source: Ambulatory Visit | Attending: Cardiology | Admitting: Cardiology

## 2014-07-13 ENCOUNTER — Emergency Department (HOSPITAL_COMMUNITY): Payer: Medicare Other

## 2014-07-13 ENCOUNTER — Other Ambulatory Visit: Payer: Self-pay

## 2014-07-13 DIAGNOSIS — F419 Anxiety disorder, unspecified: Secondary | ICD-10-CM | POA: Insufficient documentation

## 2014-07-13 DIAGNOSIS — Z8719 Personal history of other diseases of the digestive system: Secondary | ICD-10-CM | POA: Diagnosis not present

## 2014-07-13 DIAGNOSIS — R0602 Shortness of breath: Secondary | ICD-10-CM | POA: Diagnosis not present

## 2014-07-13 DIAGNOSIS — I5032 Chronic diastolic (congestive) heart failure: Secondary | ICD-10-CM | POA: Insufficient documentation

## 2014-07-13 DIAGNOSIS — E039 Hypothyroidism, unspecified: Secondary | ICD-10-CM | POA: Insufficient documentation

## 2014-07-13 DIAGNOSIS — Z880 Allergy status to penicillin: Secondary | ICD-10-CM | POA: Insufficient documentation

## 2014-07-13 DIAGNOSIS — Z952 Presence of prosthetic heart valve: Secondary | ICD-10-CM | POA: Diagnosis not present

## 2014-07-13 DIAGNOSIS — Z889 Allergy status to unspecified drugs, medicaments and biological substances status: Secondary | ICD-10-CM | POA: Insufficient documentation

## 2014-07-13 DIAGNOSIS — Z87440 Personal history of urinary (tract) infections: Secondary | ICD-10-CM | POA: Diagnosis not present

## 2014-07-13 DIAGNOSIS — I499 Cardiac arrhythmia, unspecified: Secondary | ICD-10-CM | POA: Diagnosis not present

## 2014-07-13 DIAGNOSIS — I350 Nonrheumatic aortic (valve) stenosis: Secondary | ICD-10-CM | POA: Insufficient documentation

## 2014-07-13 DIAGNOSIS — Z9889 Other specified postprocedural states: Secondary | ICD-10-CM | POA: Insufficient documentation

## 2014-07-13 DIAGNOSIS — Z8673 Personal history of transient ischemic attack (TIA), and cerebral infarction without residual deficits: Secondary | ICD-10-CM | POA: Insufficient documentation

## 2014-07-13 DIAGNOSIS — R011 Cardiac murmur, unspecified: Secondary | ICD-10-CM | POA: Diagnosis not present

## 2014-07-13 DIAGNOSIS — I4891 Unspecified atrial fibrillation: Secondary | ICD-10-CM | POA: Insufficient documentation

## 2014-07-13 DIAGNOSIS — E785 Hyperlipidemia, unspecified: Secondary | ICD-10-CM | POA: Diagnosis not present

## 2014-07-13 DIAGNOSIS — Z79899 Other long term (current) drug therapy: Secondary | ICD-10-CM | POA: Diagnosis not present

## 2014-07-13 DIAGNOSIS — K219 Gastro-esophageal reflux disease without esophagitis: Secondary | ICD-10-CM | POA: Insufficient documentation

## 2014-07-13 DIAGNOSIS — Z882 Allergy status to sulfonamides status: Secondary | ICD-10-CM | POA: Insufficient documentation

## 2014-07-13 DIAGNOSIS — Z7901 Long term (current) use of anticoagulants: Secondary | ICD-10-CM | POA: Diagnosis not present

## 2014-07-13 DIAGNOSIS — Z5189 Encounter for other specified aftercare: Secondary | ICD-10-CM | POA: Insufficient documentation

## 2014-07-13 DIAGNOSIS — Z8739 Personal history of other diseases of the musculoskeletal system and connective tissue: Secondary | ICD-10-CM | POA: Diagnosis not present

## 2014-07-13 DIAGNOSIS — I480 Paroxysmal atrial fibrillation: Secondary | ICD-10-CM | POA: Insufficient documentation

## 2014-07-13 DIAGNOSIS — M159 Polyosteoarthritis, unspecified: Secondary | ICD-10-CM | POA: Insufficient documentation

## 2014-07-13 DIAGNOSIS — Z8249 Family history of ischemic heart disease and other diseases of the circulatory system: Secondary | ICD-10-CM | POA: Insufficient documentation

## 2014-07-13 DIAGNOSIS — Z885 Allergy status to narcotic agent status: Secondary | ICD-10-CM | POA: Insufficient documentation

## 2014-07-13 DIAGNOSIS — R002 Palpitations: Secondary | ICD-10-CM | POA: Diagnosis not present

## 2014-07-13 DIAGNOSIS — Z881 Allergy status to other antibiotic agents status: Secondary | ICD-10-CM | POA: Insufficient documentation

## 2014-07-13 LAB — I-STAT CHEM 8, ED
BUN: 16 mg/dL (ref 6–23)
Calcium, Ion: 1.2 mmol/L (ref 1.13–1.30)
Chloride: 105 mEq/L (ref 96–112)
Creatinine, Ser: 1 mg/dL (ref 0.50–1.10)
Glucose, Bld: 134 mg/dL — ABNORMAL HIGH (ref 70–99)
HCT: 43 % (ref 36.0–46.0)
HEMOGLOBIN: 14.6 g/dL (ref 12.0–15.0)
Potassium: 3.9 mEq/L (ref 3.7–5.3)
SODIUM: 141 meq/L (ref 137–147)
TCO2: 22 mmol/L (ref 0–100)

## 2014-07-13 LAB — PROTIME-INR
INR: 2.6 — ABNORMAL HIGH (ref 0.00–1.49)
PROTHROMBIN TIME: 28 s — AB (ref 11.6–15.2)

## 2014-07-13 LAB — CBC
HCT: 39.7 % (ref 36.0–46.0)
Hemoglobin: 12.7 g/dL (ref 12.0–15.0)
MCH: 27.2 pg (ref 26.0–34.0)
MCHC: 32 g/dL (ref 30.0–36.0)
MCV: 85 fL (ref 78.0–100.0)
PLATELETS: 307 10*3/uL (ref 150–400)
RBC: 4.67 MIL/uL (ref 3.87–5.11)
RDW: 16 % — ABNORMAL HIGH (ref 11.5–15.5)
WBC: 9 10*3/uL (ref 4.0–10.5)

## 2014-07-13 LAB — I-STAT TROPONIN, ED: Troponin i, poc: 0 ng/mL (ref 0.00–0.08)

## 2014-07-13 MED ORDER — AMIODARONE HCL 200 MG PO TABS: 200.0000 mg | ORAL_TABLET | Freq: Two times a day (BID) | ORAL | Status: DC

## 2014-07-13 MED ORDER — AMIODARONE IV BOLUS ONLY 150 MG/100ML
150.0000 mg | Freq: Once | INTRAVENOUS | Status: AC
  Administered 2014-07-13: 150 mg via INTRAVENOUS
  Filled 2014-07-13: qty 100

## 2014-07-13 NOTE — ED Notes (Signed)
Pt. reports palpitations onset this evening with mild SOB and dizziness , denies chest pain , no diaphoresis , pt. stated aortic valve replacement 4 weeks ago .

## 2014-07-13 NOTE — ED Provider Notes (Signed)
CSN: [id number]     Arrival date & time 07/13/14  2020 History   First MD Initiated Contact with Patient 07/13/14 2034     Chief Complaint  Patient presents with  . Palpitations     (Consider location/radiation/quality/duration/timing/severity/associated sxs/prior Treatment) HPI The patient is a 73 year old female who recently underwent aortic valve replacement the N open procedure in October who presents complaining of palpitations. She denies any chest pain or lightheadedness associated with this. He had no significant syncope or syncopal episodes. She has been afebrile has had no cough or infectious symptoms recently.  The patient developed atrial fibrillation postoperatively which was rhythm controlled with amiodarone. She was followed up and was found to stay in sinus rhythm. She had been tapering her amiodarone with her last dose today with no episodes of palpitations a recurrence of her atrial fibrillation. She is currently anticoagulated on Coumadin and has been therapeutic.   Past Medical History  Diagnosis Date  . Collagenous colitis   . TIA (transient ischemic attack)   . Hypothyroidism   . HLD (hyperlipidemia)   . H/O bicuspid aortic valve   . Aortic stenosis   . Severe aortic stenosis 05/21/2014  . Orthostatic hypotension 05/10/2014  . Bicuspid aortic valve 05/10/2014  . Chronic diastolic congestive heart failure   . Exertional dyspnea 05/10/2014  . Heart murmur   . Anxiety     conditional, taking in preparation for surgery   . GERD (gastroesophageal reflux disease)     prevacid on occasion  . Compression, esophagus 2013    stretched in the past   . Foot swelling     - LEFT-ever since she had an injury as a child   . H/O hiatal hernia   . UTI (urinary tract infection)     frequent UTI-----pt. reports that she takes Cipro if needed, last UTI- July 2015  . 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 Postoperative atrial fibrillation 06/09/2014   Past Surgical History  Procedure Laterality Date  . Cardiolite  2003, 2006  . Esophageal dilation    . Appendectomy    . Cholecystectomy    . Total abdominal hysterectomy      PARTIAL  . Tonsillectomy    . Nasal septum surgery  1995  . Bladder surgery      x2 for tacking  . Thumb arthroscopy Left 2006    ? replacement of ligament   . Cardiac catheterization    . Aortic valve replacement N/A 06/07/2014    Procedure: AORTIC VALVE REPLACEMENT  (AVR) with 42mm Aortic Perimount Magna Ease;  Surgeon: Rexene Alberts, MD;  Location: [place];  Service: Open Heart Surgery;  Laterality: N/A;  . Intraoperative transesophageal echocardiogram N/A 06/07/2014    Procedure: INTRAOPERATIVE TRANSESOPHAGEAL ECHOCARDIOGRAM;  Surgeon: Rexene Alberts, MD;  Location: [place];  Service: Open Heart Surgery;  Laterality: N/A;   Family History  Problem Relation Age of Onset  . CAD Father 39  . CVA    . Heart attack Father   . Stroke Paternal Grandmother   . Heart attack Paternal Grandfather    History  Substance Use Topics  . Smoking status: Never Smoker   . Smokeless tobacco: Not on file  . Alcohol Use: No   OB History  No data available     Review of Systems  Constitutional: Negative for fever and fatigue.  Respiratory: Negative for cough and shortness of breath.   Cardiovascular: Positive for palpitations. Negative for chest pain.  Gastrointestinal: Negative for abdominal pain.  Neurological: Negative for dizziness, syncope, light-headedness and numbness.  All other systems reviewed and are negative.     Allergies  Clindamycin/lincomycin; Asacol; Codeine; Pentasa; Sulfa antibiotics; and Amoxicillin  Home Medications   Prior to Admission medications   Medication Sig Start Date End Date Taking? Authorizing Provider  ALPRAZolam  Duanne Moron) 0.25 MG tablet Take 0.25 mg by mouth daily as needed for sleep.  06/26/14  Yes Historical Provider, MD  atorvastatin (LIPITOR) 40 MG tablet Take 20 mg by mouth at bedtime.    Yes Historical Provider, MD  b complex vitamins tablet Take 1 tablet by mouth at bedtime.    Yes Historical Provider, MD  beta carotene w/minerals (OCUVITE) tablet Take 1 tablet by mouth 2 (two) times daily.    Yes Historical Provider, MD  cholecalciferol (VITAMIN D) 1000 UNITS tablet Take 1,000 Units by mouth 2 (two) times daily.    Yes Historical Provider, MD  Cinnamon 500 MG TABS Take 1 tablet by mouth 2 (two) times daily.    Yes Historical Provider, MD  co-enzyme Q-10 50 MG capsule Take 100 mg by mouth 2 (two) times daily.   Yes Historical Provider, MD  doxylamine, Sleep, (SLEEP AID) 25 MG tablet Take 50 mg by mouth at bedtime as needed for sleep.    Yes Historical Provider, MD  folic acid (FOLVITE) 833 MCG tablet Take 800 mcg by mouth daily.    Yes Historical Provider, MD  levothyroxine (SYNTHROID, LEVOTHROID) 125 MCG tablet Take 125 mcg by mouth daily. Not on sundays 03/21/14  Yes Historical Provider, MD  Melatonin 3 MG TABS Take 1 tablet by mouth at bedtime.    Yes Historical Provider, MD  Multiple Vitamins-Minerals (HAIR/SKIN/NAILS PO) Take 1 tablet by mouth daily.    Yes Historical Provider, MD  Omega-3 Fatty Acids (FISH OIL PO) Take 1,400 mg by mouth 2 (two) times daily.   Yes Historical Provider, MD  warfarin (COUMADIN) 2.5 MG tablet Take 2.5 mg po daily or as directed by [facility] Patient taking differently: Take 2.5-5 mg by mouth daily at 6 PM. Takes 2.5mg  on Mon, Wed and Fri Takes 1.25mg  all days 06/13/14  Yes Coolidge Breeze, PA-C  amiodarone (PACERONE) 200 MG tablet Take 1 tablet (200 mg total) by mouth 2 (two) times daily. 07/13/14   Leata Mouse, MD   BP 111/61 mmHg  Pulse 63  Temp(Src) 99 F (37.2 C) (Oral)  Resp 15  Ht 5\' 5"  (1.651 m)  Wt 161 lb (73.029 kg)  BMI 26.79 kg/m2  SpO2  96% Physical Exam  Constitutional: She is oriented to person, place, and time. She appears well-developed and well-nourished. No distress.  HENT:  Head: Normocephalic and atraumatic.  Eyes: Pupils are equal, round, and reactive to light.  Neck: Normal range of motion. No JVD present.  Cardiovascular: Intact distal pulses.  An irregularly irregular rhythm present. Tachycardia present.   No murmur heard. Pulmonary/Chest: Breath sounds normal. No respiratory distress. She has no wheezes.  Midline sternotomy scar appears to be healing well.  Musculoskeletal: Normal range of motion. She exhibits no edema.  Neurological: She is alert and oriented to person, place, and time.  Skin: Skin is warm and dry.  Psychiatric: She has a normal mood and affect.  Nursing note and vitals reviewed.   ED Course  Procedures (including critical care time) Labs Review Labs Reviewed  CBC - Abnormal; Notable for the following:    RDW 16.0 (*)    All other components within normal limits  PROTIME-INR - Abnormal; Notable for the following:    Prothrombin Time 28.0 (*)    INR 2.60 (*)    All other components within normal limits  I-STAT CHEM 8, ED - Abnormal; Notable for the following:    Glucose, Bld 134 (*)    All other components within normal limits  I-STAT TROPOININ, ED    Imaging Review Dg Chest 2 View  07/13/2014   CLINICAL DATA:  Palpitations, onset this evening. Mild shortness of breath and dizziness. No chest pain. No diaphoresis. Aortic valve replacement 4 weeks ago.  EXAM: CHEST  2 VIEW  COMPARISON:  07/03/2014  FINDINGS: Postoperative changes in the mediastinum with sternotomy and aortic valve prosthesis. The heart size and mediastinal contours are within normal limits. Both lungs are clear. The visualized skeletal structures are unremarkable.  IMPRESSION: No active cardiopulmonary disease.   Electronically Signed   By: Lucienne Capers M.D.   On: 07/13/2014 22:30     EKG  Interpretation None      MDM   Final diagnoses:  Atrial fibrillation with RVR    73 year old female presenting in A. fib with rapid ventricular response. She was brought back from triage immediately after viewing her EKG upon her arrival to the trauma room, she was in atrial fibrillation but her rate slowed to 100-1 10/m. She was still feeling some palpitations, but felt like they were getting better. No associated chest pain or dizziness. Since the patient had been tapering off of amiodarone and this episode had ostensibly a sudden onset, rhythm control would likely be superior to rate control. I called and spoke with the on-call cardiologist to agreed, and we will plan to give bolus of amiodarone now.  150 of amiodarone given, patient's rhythm converted to normal sinus with a rate of 60.  Patient now asymptomatic. Labs reviewed, INR 2.6, which is therapeutic. No electrolyte abnormalities that would be driving this.  We will start the patient on 200 mg of amiodarone twice a day and have her follow back up with her CT surgeon next week.    Leata Mouse, MD 07/13/14 Shorter Yao, MD 07/14/14 Maybell Yao, MD 07/14/14 [phone]

## 2014-07-13 NOTE — ED Notes (Signed)
Patient transported to X-ray 

## 2014-07-13 NOTE — Progress Notes (Signed)
Cardiac Rehab Medication Review by a Pharmacist  Does the patient  feel that his/her medications are working for him/her?  yes  Has the patient been experiencing any side effects to the medications prescribed?  No. Has experienced some dizziness recently, but not on any HTN meds. Could be amiodarone, but more likely to be because of dehydration.  Does the patient measure his/her own blood pressure or blood glucose at home?  no   Does the patient have any problems obtaining medications due to transportation or finances?   no  Understanding of regimen: good Understanding of indications: good Potential of compliance: good  Pharmacist comments: Pt reports compliance with all medications. Pt does report wanting to get off as many medications as possible, but I explained the importance of continuing these medications. No other questions or concerns today.    Becky Winters 07/13/2014 8:21 AM

## 2014-07-13 NOTE — Discharge Instructions (Signed)
Anticoagulation, Generic  Anticoagulants are medicines used to prevent clots from developing in your veins. These medicine are also known as blood thinners. If blood clots are untreated, they could travel to your lungs. This is called a pulmonary embolus. A blood clot in your lungs can be fatal.   Health care providers often use anticoagulants to prevent clots following surgery. Anticoagulants are also used along with aspirin when the heart is not getting enough blood.  Another anticoagulant called warfarin is started 2 to 3 days after a rapid-acting injectable anticoagulant is started. The rapid-acting anticoagulants are usually continued until warfarin has begun to work. Your health care provider will judge this length of time by blood tests known as the prothrombin time (PT) and International Normalization Ratio (INR). This means that your blood is at the necessary and best level to prevent clots.  RISKS AND COMPLICATIONS  · If you have received recent epidural anesthesia, spinal anesthesia, or a spinal tap while receiving anticoagulants, you are at risk for developing a blood clot in or around the spine. This condition could result in long-term or permanent paralysis.  · Because anticoagulants thin your blood, severe bleeding may occur from any tissue or organ. Symptoms of the blood being too thin may include:  ¨ Bleeding from the nose or gums that does not stop quickly.  ¨ Blood in bowel movements which may appear as bright red, dark, or black tarry stools.  ¨ Blood in the urine which may appear as pink, red, or brown urine.  ¨ Unusual bruising or bruising easily.  ¨ A cut that does not stop bleeding within 10 minutes.  ¨ Vomiting blood or continuous nausea for more than 1 day.  ¨ Coughing up blood.  ¨ Broken blood vessels in your eye (subconjunctival hemorrhage).  ¨ Abdominal or back pain with or without flank bruising.  ¨ Sudden, severe headache.  ¨ Sudden weakness or numbness of the face, arm, or leg,  especially on one side of the body.  ¨ Sudden confusion.  ¨ Trouble speaking (aphasia) or understanding.  ¨ Sudden trouble seeing in one or both eyes.  ¨ Sudden trouble walking.  ¨ Dizziness.  ¨ Loss of balance or coordination.  ¨ Vaginal bleeding.  ¨ Swelling or pain at an injection site.  ¨ Superficial fat tissue death (necrosis) which may cause skin scarring. This is more common in women and may first present as pain in the waist, thighs, or buttocks.  ¨ Fever.  · Too little anticoagulation continues to allow the risk for blood clots.  HOME CARE INSTRUCTIONS   · Due to the complications of anticoagulants, it is very important that you take your anticoagulant as directed by your health care provider. Anticoagulants need to be taken exactly as instructed. Be sure you understand all your anticoagulant instructions.  · Keep all follow-up appointments with your health care provider as directed. It is very important to keep your appointments. Not keeping appointments could result in a chronic or permanent injury, pain, or disability.  · Warfarin. Your health care provider will advise you on the length of treatment (usually 3-6 months, sometimes lifelong).  ¨ Take warfarin exactly as directed by your health care provider. It is recommended that you take your warfarin dose at the same time of the day. It is preferred that you take warfarin in the late afternoon. If you have been told to stop taking warfarin, do not resume taking warfarin until directed to do so by your health care   provider. Follow your health care provider's instructions if you accidentally take an extra dose or miss a dose of warfarin. It is very important to take warfarin as directed since bleeding or blood clots could result in chronic or permanent injury, pain, or disability.  ¨ Too much and too little warfarin are both dangerous. Too much warfarin increases the risk of bleeding. Too little warfarin continues to allow the risk for blood clots. While  taking warfarin, you will need to have regular blood tests to measure your blood clotting time. These blood tests usually include both the prothrombin time (PT) and International Normalized Ratio (INR) tests. The PT and INR results allow your health care provider to adjust your dose of warfarin. The dose can change for many reasons. It is critically important that you have your PT and INR levels drawn exactly as directed. Your warfarin dose may stay the same or change depending on what the PT and INR results are. Be sure to follow up with your health care provider regarding your PT and INR test results and what your warfarin dosage should be.  ¨ Many medicines can interfere with warfarin and affect the PT and INR results. You must tell your health care provider about any and all medicines you take, this includes all vitamins and supplements. Ask your health care provider before taking these. Prescription and over-the-counter medicine consistency is critical to warfarin management. It is important that potential interactions are checked before you start a new medicine. Be especially cautious with aspirin and anti-inflammatory medicines. Ask your health care provider before taking these. Medicines such as antibiotics and acid-reducing medicine can interact with warfarin and can cause an increased warfarin effect. Warfarin can also interfere with the effectiveness of medicines you are taking. Do not take or discontinue any prescribed or over-the-counter medicine except on the advice of your health care provider or pharmacist.  ¨ Some vitamins, supplements, and herbal products interfere with the effectiveness of warfarin. Vitamin E may increase the anticoagulant effects of warfarin. Vitamin K may can cause warfarin to be less effective. Do not take or discontinue any vitamin, supplement, or herbal product except on the advice of your health care provider or pharmacist.  ¨ Eat what you normally eat and keep the vitamin K  content of your diet consistent. Avoid major changes in your diet, or notify your health care provider before changing your diet. Suddenly getting a lot more vitamin K could cause your blood to clot too quickly. A sudden decrease in vitamin K intake could cause your blood to clot too slowly. These changes in vitamin K intake could lead to dangerous blood clots or to bleeding. To keep your vitamin K intake consistent, you must be aware of which foods contain moderate or high amounts of vitamin K. Some foods high in vitamin K include spinach, kale, broccoli, cabbage, greens, Brussels sprouts, asparagus, Bok [place]oy, coleslaw, parsley, and green tea. Arrange a visit with a dietitian to answer your questions.  ¨ If you have a loss of appetite or get the stomach flu (viral gastroenteritis), talk to your health care provider as soon as possible. A decrease in your normal vitamin K intake can make you more sensitive to your usual dose of warfarin.  ¨ Some medical conditions may increase your risk for bleeding while you are taking warfarin. A fever, diarrhea lasting more than a day, worsening heart failure, or worsening liver function are some medical conditions that could affect warfarin. Contact your health care provider if   you have any of these medical conditions.  ¨ Alcohol can change the body's ability to handle warfarin. It is best to avoid alcoholic drinks or consume only very small amounts while taking warfarin. Notify your health care provider if you change your alcohol intake. A sudden increase in alcohol use can increase your risk of bleeding. Chronic alcohol use can cause warfarin to be less effective.  · Be careful not to cut yourself when using sharp objects or while shaving.  · Inform all your health care providers and your dentist that you take an anticoagulant.  · Limit physical activities or sports that could result in a fall or cause injury. Avoid contact sports.  · Wear medical alert jewelry or carry a  medical alert card.  SEEK IMMEDIATE MEDICAL CARE IF:  · You cough up blood.  · You have dark or black stools or there is bright red blood coming from your rectum.  · You vomit blood or have nausea for more than 1 day.  · You have blood in the urine or pink colored urine.  · You have unusual bruising or have increased bruising.  · You have bleeding from the nose or gums that does not stop quickly.  · You have a cut that does not stop bleeding within a 2-3 minutes.  · You have sudden weakness or numbness of the face, arm, or leg, especially on one side of the body.  · You have sudden confusion.  · You have trouble speaking (aphasia) or understanding.  · You have sudden trouble seeing in one or both eyes.  · You have sudden trouble walking.  · You have dizziness.  · You have a loss of balance or coordination.  · You have a sudden, severe headache.  · You have a serious fall or head injury, even if you are not bleeding.  · You have swelling or pain at an injection site.  · You have unexplained tenderness or pain in the abdomen, back, waist, thighs or buttocks.  · You have a fever.  Any of these symptoms may represent a serious problem that is an emergency. Do not wait to see if the symptoms will go away. Get medical help right away. Call your local emergency services (911 in [place] Do not drive yourself to the hospital.  Document Released: 08/11/2005 Document Revised: 08/16/2013 Document Reviewed: 03/15/2008  [employer]® Patient Information ©2015 [employer]. This information is not intended to replace advice given to you by your health care provider. Make sure you discuss any questions you have with your health care provider.

## 2014-07-14 ENCOUNTER — Telehealth: Payer: Self-pay | Admitting: Cardiology

## 2014-07-14 ENCOUNTER — Ambulatory Visit (INDEPENDENT_AMBULATORY_CARE_PROVIDER_SITE_OTHER): Payer: Medicare Other | Admitting: Internal Medicine

## 2014-07-14 DIAGNOSIS — I4891 Unspecified atrial fibrillation: Secondary | ICD-10-CM

## 2014-07-14 DIAGNOSIS — Z954 Presence of other heart-valve replacement: Secondary | ICD-10-CM

## 2014-07-14 DIAGNOSIS — Z953 Presence of xenogenic heart valve: Secondary | ICD-10-CM

## 2014-07-14 DIAGNOSIS — Q231 Congenital insufficiency of aortic valve: Secondary | ICD-10-CM

## 2014-07-14 DIAGNOSIS — Z5181 Encounter for therapeutic drug level monitoring: Secondary | ICD-10-CM

## 2014-07-14 DIAGNOSIS — I9789 Other postprocedural complications and disorders of the circulatory system, not elsewhere classified: Secondary | ICD-10-CM

## 2014-07-14 NOTE — Telephone Encounter (Signed)
New message          Pt husband would like Webb Silversmith to give him a call / He did not disclose any other info

## 2014-07-14 NOTE — Telephone Encounter (Signed)
Pt's husband states pt went to ED last night and was found to be in afib. Pt was given amiodarone 150mg  X1 IV in ED and converted to SR. Pt's husband states amiodarone was increased from 200mg  daily to 200mg  bid.  INR was checked last night in ED, it was 2.6.  Pt feels she is in SR this AM, no cardiology symptoms.  Pt has appt 08/10/14 with Nicki Reaper. Pt's husband asking if pt should make any other changes at this time.  I will forward to Dr Aundra Dubin.

## 2014-07-14 NOTE — Telephone Encounter (Signed)
Pt's husband advised, verbalized understanding.

## 2014-07-14 NOTE — Telephone Encounter (Signed)
Continue amiodarone 200 mg bid x 1 week then back to 200 mg daily.  I think she will need to continue warfarin long-term.

## 2014-07-17 ENCOUNTER — Telehealth (HOSPITAL_COMMUNITY): Payer: Self-pay | Admitting: *Deleted

## 2014-07-17 ENCOUNTER — Ambulatory Visit (HOSPITAL_COMMUNITY): Payer: Medicare Other

## 2014-07-17 ENCOUNTER — Encounter (HOSPITAL_COMMUNITY)
Admission: RE | Admit: 2014-07-17 | Discharge: 2014-07-17 | Disposition: A | Discharge status: Home / Self Care | Payer: Medicare Other | Source: Ambulatory Visit | Attending: Cardiology | Admitting: Cardiology

## 2014-07-17 NOTE — Telephone Encounter (Signed)
-----   Message from Larey Dresser, MD sent at 07/17/2014  1:20 PM EST ----- Regarding: RE: exercise at cardaic rehab OK to exercise ----- Message -----    From: Magda Kiel, RN    Sent: 07/17/2014   9:53 AM      To: Larey Dresser, MD Subject: exercise at [facility]                      Good morning Dr Aundra Dubin,  Checking to see if you are okay with Mrs Boxley starting cardiac rehab today? Jaysie went to the emergency room on Thursday with Atrial fibrillation and received IV amiodarone.   Thanks for you input   Verdis Frederickson

## 2014-07-17 NOTE — Progress Notes (Signed)
Becky Winters reported for her first day of exercise at cardiac rehab today. Becky Winters reported that she went to the ED Thursday night with Atrial fibrillation and received IV amiodarone. Will check with Dr Aundra Dubin if he is okay with Becky Winters starting exercise today. Patient placed on monitor in normal sinus rhythm rate 69. Sitting blood pressure 112/70. Standing blood pressure 108/80.  Patient report experiencing some dizziness on a daily basis. Patient did not report feeling any worse when she changes position. Becky Winters did not exercise this morning.  Will await response from Dr Aundra Dubin.

## 2014-07-18 ENCOUNTER — Telehealth (HOSPITAL_COMMUNITY): Payer: Self-pay | Admitting: *Deleted

## 2014-07-18 ENCOUNTER — Telehealth: Payer: Self-pay | Admitting: Cardiology

## 2014-07-18 NOTE — Telephone Encounter (Signed)
Husband calls for his wife this am. Since yesterday she has had chills, some nausea, vomited once yesterday and once today. Her temp today is 100.7. p 77 and regular and bp is 107/66. They were thinking it may be due to the Amiodarone.spoke with Dr. Irish Lack  Who advised probably not the Amiodarone. They need to call her PCP. Husband agreeable.

## 2014-07-18 NOTE — Telephone Encounter (Signed)
New Message         Pt's husband calling stating wife has been very sick, very cold, no fever, vomiting. Please call back and advise.

## 2014-07-19 ENCOUNTER — Telehealth (HOSPITAL_COMMUNITY): Payer: Self-pay | Admitting: Endocrinology

## 2014-07-19 ENCOUNTER — Telehealth (HOSPITAL_COMMUNITY): Payer: Self-pay | Admitting: *Deleted

## 2014-07-19 ENCOUNTER — Encounter (HOSPITAL_COMMUNITY): Payer: Medicare Other

## 2014-07-21 ENCOUNTER — Encounter (HOSPITAL_COMMUNITY): Payer: Medicare Other

## 2014-07-24 ENCOUNTER — Encounter (HOSPITAL_COMMUNITY)
Admission: RE | Admit: 2014-07-24 | Discharge: 2014-07-24 | Disposition: A | Discharge status: Home / Self Care | Payer: Medicare Other | Source: Ambulatory Visit | Attending: Cardiology | Admitting: Cardiology

## 2014-07-24 ENCOUNTER — Telehealth (HOSPITAL_COMMUNITY): Payer: Self-pay | Admitting: *Deleted

## 2014-07-24 DIAGNOSIS — K219 Gastro-esophageal reflux disease without esophagitis: Secondary | ICD-10-CM | POA: Diagnosis not present

## 2014-07-24 DIAGNOSIS — Z5189 Encounter for other specified aftercare: Secondary | ICD-10-CM | POA: Diagnosis not present

## 2014-07-24 DIAGNOSIS — I5032 Chronic diastolic (congestive) heart failure: Secondary | ICD-10-CM | POA: Diagnosis not present

## 2014-07-24 DIAGNOSIS — Z8673 Personal history of transient ischemic attack (TIA), and cerebral infarction without residual deficits: Secondary | ICD-10-CM | POA: Diagnosis not present

## 2014-07-24 DIAGNOSIS — I480 Paroxysmal atrial fibrillation: Secondary | ICD-10-CM | POA: Diagnosis not present

## 2014-07-24 DIAGNOSIS — Z8249 Family history of ischemic heart disease and other diseases of the circulatory system: Secondary | ICD-10-CM | POA: Diagnosis not present

## 2014-07-24 DIAGNOSIS — Z882 Allergy status to sulfonamides status: Secondary | ICD-10-CM | POA: Diagnosis not present

## 2014-07-24 DIAGNOSIS — Z889 Allergy status to unspecified drugs, medicaments and biological substances status: Secondary | ICD-10-CM | POA: Diagnosis not present

## 2014-07-24 DIAGNOSIS — E039 Hypothyroidism, unspecified: Secondary | ICD-10-CM | POA: Diagnosis not present

## 2014-07-24 DIAGNOSIS — Z952 Presence of prosthetic heart valve: Secondary | ICD-10-CM | POA: Diagnosis not present

## 2014-07-24 DIAGNOSIS — F419 Anxiety disorder, unspecified: Secondary | ICD-10-CM | POA: Diagnosis not present

## 2014-07-24 DIAGNOSIS — Z885 Allergy status to narcotic agent status: Secondary | ICD-10-CM | POA: Diagnosis not present

## 2014-07-24 DIAGNOSIS — I350 Nonrheumatic aortic (valve) stenosis: Secondary | ICD-10-CM | POA: Diagnosis not present

## 2014-07-24 DIAGNOSIS — Z881 Allergy status to other antibiotic agents status: Secondary | ICD-10-CM | POA: Diagnosis not present

## 2014-07-24 DIAGNOSIS — E785 Hyperlipidemia, unspecified: Secondary | ICD-10-CM | POA: Diagnosis not present

## 2014-07-24 DIAGNOSIS — M159 Polyosteoarthritis, unspecified: Secondary | ICD-10-CM | POA: Diagnosis not present

## 2014-07-24 NOTE — Progress Notes (Signed)
Pt started cardiac rehab today.  Pt tolerated light exercise without difficulty. Telemetry rhythm Sinus.  Vital signs stable. Becky Winters reported having a temperature last week but has been without fever for several days. Becky Winters did not complain of feeling lightheaded today. PHQ score =0. No psychosocial needs assessed at this time. Will continue to monitor the patient throughout  the program.

## 2014-07-25 ENCOUNTER — Ambulatory Visit (HOSPITAL_COMMUNITY): Payer: Medicare Other | Attending: Cardiology | Admitting: Cardiology

## 2014-07-25 ENCOUNTER — Encounter: Payer: Self-pay | Admitting: Physician Assistant

## 2014-07-25 ENCOUNTER — Telehealth: Payer: Self-pay | Admitting: *Deleted

## 2014-07-25 ENCOUNTER — Ambulatory Visit (INDEPENDENT_AMBULATORY_CARE_PROVIDER_SITE_OTHER): Payer: Medicare Other | Admitting: Surgery

## 2014-07-25 DIAGNOSIS — I9789 Other postprocedural complications and disorders of the circulatory system, not elsewhere classified: Secondary | ICD-10-CM

## 2014-07-25 DIAGNOSIS — Z48812 Encounter for surgical aftercare following surgery on the circulatory system: Secondary | ICD-10-CM | POA: Diagnosis not present

## 2014-07-25 DIAGNOSIS — Z954 Presence of other heart-valve replacement: Secondary | ICD-10-CM

## 2014-07-25 DIAGNOSIS — Z5181 Encounter for therapeutic drug level monitoring: Secondary | ICD-10-CM | POA: Diagnosis not present

## 2014-07-25 DIAGNOSIS — Z952 Presence of prosthetic heart valve: Secondary | ICD-10-CM

## 2014-07-25 DIAGNOSIS — Q231 Congenital insufficiency of aortic valve: Secondary | ICD-10-CM

## 2014-07-25 DIAGNOSIS — I4891 Unspecified atrial fibrillation: Secondary | ICD-10-CM | POA: Diagnosis not present

## 2014-07-25 DIAGNOSIS — Z953 Presence of xenogenic heart valve: Secondary | ICD-10-CM

## 2014-07-25 LAB — POCT INR: INR: 5.1

## 2014-07-25 NOTE — Telephone Encounter (Signed)
pt notified about echo results with verbal understanding. Pt states she is depressed that she has not yet seen Dr. Aundra Dubin since her surgery and needs to be reassured. Pt has f/u w/Scott W. PA 08/10/14.

## 2014-07-25 NOTE — Progress Notes (Signed)
Echo performed. 

## 2014-07-26 ENCOUNTER — Encounter (HOSPITAL_COMMUNITY)
Admission: RE | Admit: 2014-07-26 | Discharge: 2014-07-26 | Disposition: A | Discharge status: Home / Self Care | Payer: Medicare Other | Source: Ambulatory Visit | Attending: Cardiology | Admitting: Cardiology

## 2014-07-26 DIAGNOSIS — I480 Paroxysmal atrial fibrillation: Secondary | ICD-10-CM | POA: Diagnosis not present

## 2014-07-26 DIAGNOSIS — M159 Polyosteoarthritis, unspecified: Secondary | ICD-10-CM | POA: Diagnosis not present

## 2014-07-26 DIAGNOSIS — Z881 Allergy status to other antibiotic agents status: Secondary | ICD-10-CM | POA: Insufficient documentation

## 2014-07-26 DIAGNOSIS — Z882 Allergy status to sulfonamides status: Secondary | ICD-10-CM | POA: Diagnosis not present

## 2014-07-26 DIAGNOSIS — Z8249 Family history of ischemic heart disease and other diseases of the circulatory system: Secondary | ICD-10-CM | POA: Insufficient documentation

## 2014-07-26 DIAGNOSIS — E039 Hypothyroidism, unspecified: Secondary | ICD-10-CM | POA: Diagnosis not present

## 2014-07-26 DIAGNOSIS — Z952 Presence of prosthetic heart valve: Secondary | ICD-10-CM | POA: Insufficient documentation

## 2014-07-26 DIAGNOSIS — Z5189 Encounter for other specified aftercare: Secondary | ICD-10-CM | POA: Diagnosis not present

## 2014-07-26 DIAGNOSIS — F419 Anxiety disorder, unspecified: Secondary | ICD-10-CM | POA: Insufficient documentation

## 2014-07-26 DIAGNOSIS — I5032 Chronic diastolic (congestive) heart failure: Secondary | ICD-10-CM | POA: Diagnosis not present

## 2014-07-26 DIAGNOSIS — Z889 Allergy status to unspecified drugs, medicaments and biological substances status: Secondary | ICD-10-CM | POA: Diagnosis not present

## 2014-07-26 DIAGNOSIS — Z8673 Personal history of transient ischemic attack (TIA), and cerebral infarction without residual deficits: Secondary | ICD-10-CM | POA: Diagnosis not present

## 2014-07-26 DIAGNOSIS — E785 Hyperlipidemia, unspecified: Secondary | ICD-10-CM | POA: Insufficient documentation

## 2014-07-26 DIAGNOSIS — K219 Gastro-esophageal reflux disease without esophagitis: Secondary | ICD-10-CM | POA: Diagnosis not present

## 2014-07-26 DIAGNOSIS — Z885 Allergy status to narcotic agent status: Secondary | ICD-10-CM | POA: Diagnosis not present

## 2014-07-26 DIAGNOSIS — I350 Nonrheumatic aortic (valve) stenosis: Secondary | ICD-10-CM | POA: Diagnosis not present

## 2014-07-28 ENCOUNTER — Encounter (HOSPITAL_COMMUNITY)
Admission: RE | Admit: 2014-07-28 | Discharge: 2014-07-28 | Disposition: A | Discharge status: Home / Self Care | Payer: Medicare Other | Source: Ambulatory Visit | Attending: Cardiology | Admitting: Cardiology

## 2014-07-28 DIAGNOSIS — Z5189 Encounter for other specified aftercare: Secondary | ICD-10-CM | POA: Diagnosis not present

## 2014-07-28 NOTE — Progress Notes (Signed)
Reviewed Becky Winters's quality of life questionnaire. Low scores noted in the health and functioning area. Becky Winters reports feeling a little depressed due to her colitis and recent open heart surgery. Becky Winters says she has great support from her husband and her faith. Emotional support given. Will continue to monitor the patient throughout  the program.

## 2014-07-31 ENCOUNTER — Telehealth: Payer: Self-pay | Admitting: Cardiology

## 2014-07-31 ENCOUNTER — Encounter (HOSPITAL_COMMUNITY)
Admission: RE | Admit: 2014-07-31 | Discharge: 2014-07-31 | Disposition: A | Discharge status: Home / Self Care | Payer: Medicare Other | Source: Ambulatory Visit | Attending: Cardiology | Admitting: Cardiology

## 2014-07-31 ENCOUNTER — Ambulatory Visit (INDEPENDENT_AMBULATORY_CARE_PROVIDER_SITE_OTHER): Payer: Medicare Other | Admitting: *Deleted

## 2014-07-31 DIAGNOSIS — Q231 Congenital insufficiency of aortic valve: Secondary | ICD-10-CM

## 2014-07-31 DIAGNOSIS — I9789 Other postprocedural complications and disorders of the circulatory system, not elsewhere classified: Secondary | ICD-10-CM | POA: Diagnosis not present

## 2014-07-31 DIAGNOSIS — I4891 Unspecified atrial fibrillation: Secondary | ICD-10-CM

## 2014-07-31 DIAGNOSIS — Z5181 Encounter for therapeutic drug level monitoring: Secondary | ICD-10-CM | POA: Diagnosis not present

## 2014-07-31 DIAGNOSIS — Z5189 Encounter for other specified aftercare: Secondary | ICD-10-CM | POA: Diagnosis not present

## 2014-07-31 DIAGNOSIS — Z954 Presence of other heart-valve replacement: Secondary | ICD-10-CM

## 2014-07-31 DIAGNOSIS — Z953 Presence of xenogenic heart valve: Secondary | ICD-10-CM

## 2014-07-31 LAB — POCT INR: INR: 2.1

## 2014-07-31 NOTE — Telephone Encounter (Signed)
OK to stay off amiodarone.  Will get ECG at followup.

## 2014-07-31 NOTE — Telephone Encounter (Signed)
I spoke with patient when she was here for CVRR todday Pt states she has been dizzy since she was placed on amiodarone. This past Saturday she had an episode of double vision, not associated with any other symptoms, including headache. She was concerned it was a side effect of amiodarone so she stopped amiodarone on Saturday. Her BP today when I checked it was 120/70 and heart rate 68 and regular, INR today 2.1. Pt states she went to Cardiac Rehab today, her BP and heart rate were good there, she exercised without any problems. Pt does not feel she has had any afib since ED visit when she was placed on amiodarone. Pt has appt 08/10/14 with Nicki Reaper. Pt does not want to take amiodarone.  I will forward to Dr Aundra Dubin for review.

## 2014-07-31 NOTE — Telephone Encounter (Signed)
Pt advised Ok to stay off amiodarone

## 2014-07-31 NOTE — Telephone Encounter (Signed)
New Message         Pt's husband calling stating that pt is c/o dizziness and blurred vision. Wanting to know if pt can come off amiodarone. Please call back and advise.

## 2014-07-31 NOTE — Telephone Encounter (Signed)
LMTCB

## 2014-07-31 NOTE — Progress Notes (Signed)
Reviewed home exercise with pt today.  Pt plans to walk at home for exercise.  Reviewed THR, pulse, RPE, sign and symptoms, and when to call 911 or MD.  Pt voiced understanding. [place], MA, ACSM RCEP   

## 2014-08-02 ENCOUNTER — Encounter (HOSPITAL_COMMUNITY)
Admission: RE | Admit: 2014-08-02 | Discharge: 2014-08-02 | Disposition: A | Discharge status: Home / Self Care | Payer: Medicare Other | Source: Ambulatory Visit | Attending: Cardiology | Admitting: Cardiology

## 2014-08-02 DIAGNOSIS — Z5189 Encounter for other specified aftercare: Secondary | ICD-10-CM | POA: Diagnosis not present

## 2014-08-03 ENCOUNTER — Encounter (HOSPITAL_COMMUNITY): Payer: Self-pay | Admitting: Cardiology

## 2014-08-03 DIAGNOSIS — H532 Diplopia: Secondary | ICD-10-CM | POA: Diagnosis not present

## 2014-08-04 ENCOUNTER — Encounter (HOSPITAL_COMMUNITY)
Admission: RE | Admit: 2014-08-04 | Discharge: 2014-08-04 | Disposition: A | Discharge status: Home / Self Care | Payer: Medicare Other | Source: Ambulatory Visit | Attending: Cardiology | Admitting: Cardiology

## 2014-08-04 DIAGNOSIS — Z5189 Encounter for other specified aftercare: Secondary | ICD-10-CM | POA: Diagnosis not present

## 2014-08-07 ENCOUNTER — Encounter (HOSPITAL_COMMUNITY)
Admission: RE | Admit: 2014-08-07 | Discharge: 2014-08-07 | Disposition: A | Discharge status: Home / Self Care | Payer: Medicare Other | Source: Ambulatory Visit | Attending: Cardiology | Admitting: Cardiology

## 2014-08-07 ENCOUNTER — Ambulatory Visit (INDEPENDENT_AMBULATORY_CARE_PROVIDER_SITE_OTHER): Payer: Medicare Other | Admitting: *Deleted

## 2014-08-07 DIAGNOSIS — Q231 Congenital insufficiency of aortic valve: Secondary | ICD-10-CM | POA: Diagnosis not present

## 2014-08-07 DIAGNOSIS — I4891 Unspecified atrial fibrillation: Secondary | ICD-10-CM

## 2014-08-07 DIAGNOSIS — Z5189 Encounter for other specified aftercare: Secondary | ICD-10-CM | POA: Diagnosis not present

## 2014-08-07 DIAGNOSIS — I9789 Other postprocedural complications and disorders of the circulatory system, not elsewhere classified: Secondary | ICD-10-CM

## 2014-08-07 DIAGNOSIS — Z5181 Encounter for therapeutic drug level monitoring: Secondary | ICD-10-CM

## 2014-08-07 DIAGNOSIS — Z954 Presence of other heart-valve replacement: Secondary | ICD-10-CM

## 2014-08-07 DIAGNOSIS — Z953 Presence of xenogenic heart valve: Secondary | ICD-10-CM

## 2014-08-07 LAB — POCT INR: INR: 2.7

## 2014-08-09 ENCOUNTER — Encounter (HOSPITAL_COMMUNITY)
Admission: RE | Admit: 2014-08-09 | Discharge: 2014-08-09 | Disposition: A | Discharge status: Home / Self Care | Payer: Medicare Other | Source: Ambulatory Visit | Attending: Cardiology | Admitting: Cardiology

## 2014-08-09 DIAGNOSIS — Z5189 Encounter for other specified aftercare: Secondary | ICD-10-CM | POA: Diagnosis not present

## 2014-08-10 ENCOUNTER — Encounter: Payer: Self-pay | Admitting: Physician Assistant

## 2014-08-10 ENCOUNTER — Other Ambulatory Visit (INDEPENDENT_AMBULATORY_CARE_PROVIDER_SITE_OTHER): Payer: Medicare Other | Admitting: *Deleted

## 2014-08-10 ENCOUNTER — Ambulatory Visit (INDEPENDENT_AMBULATORY_CARE_PROVIDER_SITE_OTHER): Payer: Medicare Other | Admitting: Physician Assistant

## 2014-08-10 VITALS — BP 140/81 | HR 64 | Ht 64.5 in | Wt 160.0 lb

## 2014-08-10 DIAGNOSIS — Z954 Presence of other heart-valve replacement: Secondary | ICD-10-CM

## 2014-08-10 DIAGNOSIS — I951 Orthostatic hypotension: Secondary | ICD-10-CM | POA: Diagnosis not present

## 2014-08-10 DIAGNOSIS — Z79899 Other long term (current) drug therapy: Secondary | ICD-10-CM

## 2014-08-10 DIAGNOSIS — I2510 Atherosclerotic heart disease of native coronary artery without angina pectoris: Secondary | ICD-10-CM | POA: Diagnosis not present

## 2014-08-10 DIAGNOSIS — Z953 Presence of xenogenic heart valve: Secondary | ICD-10-CM

## 2014-08-10 DIAGNOSIS — R002 Palpitations: Secondary | ICD-10-CM

## 2014-08-10 DIAGNOSIS — I480 Paroxysmal atrial fibrillation: Secondary | ICD-10-CM

## 2014-08-10 DIAGNOSIS — Z952 Presence of prosthetic heart valve: Secondary | ICD-10-CM

## 2014-08-10 DIAGNOSIS — E785 Hyperlipidemia, unspecified: Secondary | ICD-10-CM

## 2014-08-10 LAB — TSH: TSH: 3.24 u[IU]/mL (ref 0.35–4.50)

## 2014-08-10 LAB — HEPATIC FUNCTION PANEL
ALK PHOS: 50 U/L (ref 39–117)
ALT: 18 U/L (ref 0–35)
AST: 18 U/L (ref 0–37)
Albumin: 3.7 g/dL (ref 3.5–5.2)
BILIRUBIN DIRECT: 0 mg/dL (ref 0.0–0.3)
BILIRUBIN TOTAL: 0.4 mg/dL (ref 0.2–1.2)
TOTAL PROTEIN: 6.7 g/dL (ref 6.0–8.3)

## 2014-08-10 MED ORDER — METOPROLOL TARTRATE 25 MG PO TABS: 12.5000 mg | ORAL_TABLET | ORAL | Status: DC | PRN

## 2014-08-10 MED ORDER — AZITHROMYCIN 250 MG PO TABS: ORAL_TABLET | ORAL | Status: DC

## 2014-08-10 NOTE — Progress Notes (Signed)
Cardiology Office Note   Date:  08/10/2014   ID:  Becky Winters, Becky Winters September 24, 1940, MRN [id number]  PCP:  Sheela Stack, MD  Cardiologist:  Dr. Loralie Champagne     History of Present Illness: Becky Winters is a 73 y.o. female with a hx of Aortic Stenosis in the setting of bicuspid aortic valve.  She underwent bioprosthetic AVR 05/2014 with Dr. Roxy Manns.  Postoperative course was complicated by atrial fibrillation controlled on amiodarone. She had recurrent episodes of atrial fibrillation the hospital. She noted symptoms of palpitations prior to her valve surgery. It was suspected that she was having paroxysmal atrial fibrillation prior to admission. She was placed on Coumadin.  I saw her 06/30/14 in FU.  She went to the ED 11/19 and was back in AFib. She had recently decreased Amiodarone.   This was increased back to 400 mg daily and she reverted back to NSR.  She returns for FU.  She has called in recently with side effects from amiodarone including dizziness and diplopia. Dr. Aundra Dubin to stop amiodarone. She has felt well since. She has had some difficulty with low blood pressures at cardiac rehabilitation. She has checked her pressures at home and they've been low as well.  She denies syncope or near-syncope. Chest remains sore. She denies significant dyspnea. She denies orthopnea, PND. She has mild pedal edema that is chronic on the left from prior foot injury.   Studies:  - LHC (10/15): pCFX 40% - Echo (9/15): Mild LVH, EF 60-65%, no RWMA, Gr 1 DD, bicuspid AV, severe AS (mean 37 mmHg), mild TR, PASP 27 mmHg  - Echo (12/15):  Mild LVH, EF 55%, AVR ok, mild MR, mild LAE - Carotid US (10/15): Bilateral ICA 1-39%   Recent Labs: 05/09/2014: Pro B Natriuretic peptide (BNP) 53.0 06/05/2014: ALT 11 07/13/2014: BUN 16; Creatinine 1.00; Hemoglobin 14.6; Potassium 3.9; Sodium 141    Recent Radiology:    Wt Readings from Last 3 Encounters:  07/13/14 161 lb (73.029 kg)  07/13/14 160  lb 15 oz (73 kg)  07/03/14 157 lb (71.215 kg)     Past Medical History: 1. Collagenous colitis 2. TIA 3. Hypothyroidism 4. Hyperlipidemia 5. Cardiolite in 2003 and 2006 normal.  6. Esophageal stricture. 7. Bicuspid aortic valve: Diagnosed on 4/12 echo with mild AS. Repeat echo (9/15) with EF 60-65%, bicuspid aortic valve with severe AS (mean gradient 37 mmHg, peak gradient 68 mmHg, AVA 0.6-0.7 cm^2). MRA chest (9/15) with 3.8 cm ascending aorta. S/p 23 mm Aortic Perimount Magna Ease tissue valve 06/07/14 (Dr. Roxy Manns) 8. Cholecystectomy 9. TAH 10. Appendectomy Past Medical History  Diagnosis Date  . Collagenous colitis   . TIA (transient ischemic attack)   . Hypothyroidism   . HLD (hyperlipidemia)   . Severe aortic stenosis 05/21/2014    s/p AVR 05/2014  . Orthostatic hypotension 05/10/2014  . Chronic diastolic congestive heart failure   . Anxiety     conditional, taking in preparation for surgery   . GERD (gastroesophageal reflux disease)     prevacid on occasion  . Compression, esophagus 2013    stretched in the past   . Foot swelling     - LEFT-ever since she had an injury as a child   . H/O hiatal hernia   . UTI (urinary tract infection)     frequent UTI-----pt. reports that she takes Cipro if needed, last UTI- July 2015  . 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 Postoperative atrial fibrillation 06/09/2014  . Hx of echocardiogram     post AVR >> Echo (12/15):  Mild LVH, EF 55%, AVR ok (Mean gradient (S): 10 mm Hg), mild MR, mild LAE    Current Outpatient Prescriptions  Medication Sig Dispense Refill  . ALPRAZolam (XANAX) 0.25 MG tablet Take 0.25 mg by mouth daily as needed for sleep.     Marland Kitchen atorvastatin (LIPITOR) 40 MG tablet Take 20 mg by mouth at bedtime.     Marland Kitchen b complex vitamins tablet  Take 1 tablet by mouth at bedtime.     . beta carotene w/minerals (OCUVITE) tablet Take 1 tablet by mouth 2 (two) times daily.     . cholecalciferol (VITAMIN D) 1000 UNITS tablet Take 1,000 Units by mouth 2 (two) times daily.     . Cinnamon 500 MG TABS Take 1 tablet by mouth 2 (two) times daily.     Marland Kitchen co-enzyme Q-10 50 MG capsule Take 100 mg by mouth 2 (two) times daily.    Marland Kitchen doxylamine, Sleep, (SLEEP AID) 25 MG tablet Take 50 mg by mouth at bedtime as needed for sleep.     . folic acid (FOLVITE) 811 MCG tablet Take 800 mcg by mouth daily.     Marland Kitchen levothyroxine (SYNTHROID, LEVOTHROID) 125 MCG tablet Take 125 mcg by mouth daily. Not on sundays    . Melatonin 3 MG TABS Take 1 tablet by mouth at bedtime.     . Multiple Vitamins-Minerals (HAIR/SKIN/NAILS PO) Take 1 tablet by mouth daily.     . Omega-3 Fatty Acids (FISH OIL PO) Take 1,400 mg by mouth 2 (two) times daily.    Marland Kitchen warfarin (COUMADIN) 2.5 MG tablet Take 2.5 mg po daily or as directed by [facility]madin Clinic (Patient taking differently: Take 2.5-5 mg by mouth daily at 6 PM. Takes 2.5mg  on Mon, Wed and Fri Takes 1.25mg  all days) 30 tablet 1   No current facility-administered medications for this visit.     Allergies:   Clindamycin/lincomycin; Asacol; Codeine; Pentasa; Sulfa antibiotics; and Amoxicillin   Social History:  The patient  reports that she has never smoked. She does not have any smokeless tobacco history on file. She reports that she does not drink alcohol or use illicit drugs.   Family History:  The patient's family history includes CAD (age of onset: 60) in her father; CVA in an other family member; Heart attack in her father and paternal grandfather; Stroke in her paternal grandmother.    ROS:  Please see the history of present illness.       All other systems reviewed and negative.    PHYSICAL EXAM: VS:  BP 140/81 mmHg  Pulse 64  Ht 5' 4.5" (1.638 m)  Wt 160 lb (72.576 kg)  BMI 27.05 kg/m2  SpO2 96%   Orthostatic VS  for the past 24 hrs:  BP- Lying Pulse- Lying BP- Sitting Pulse- Sitting BP- Standing at 0 minutes Pulse- Standing at 0 minutes  08/10/14 1431 140/81 mmHg 65 138/78 mmHg 61 113/70 mmHg 78   Well nourished, well developed, in no acute distress HEENT: normal Neck: no JVD Cardiac:  normal S1, S2;  RRR; no murmur   Lungs:   clear to auscultation bilaterally, no wheezing, rhonchi or rales Abd: soft, nontender, no hepatomegaly Ext:  Trace bilateral  ankle edema Skin: warm and dry Neuro:  CNs 2-12 intact, no focal abnormalities noted  EKG:  NSR, HR 64, normal axis, nonspecific ST-T wave changes, QTc 474 ms      ASSESSMENT AND PLAN: No diagnosis found.  1.  Aortic Stenosis s/p Bioprosthetic AVR:  FU echo demonstrated good valve function.  She understands the importance of SBE prophylaxis. She has multiple drug allergies. She plans to have a root canal once cleared by Dr. Roxy Manns. I will give her a prescription for Zithromax take 500 mg 30-60 minutes prior to dental procedure. 2.  Atrial Fibrillation:  She had recurrent AFib after stopping her Amiodarone.  However, she had side effects and had to stop this. She is currently in NSR.  She would like to stop Coumadin. However, CHADS2-VASc=3.  She would likely benefit from long-term Coumadin. We could certainly consider placing her on a monitor for 30 days in a few months. If she has no atrial fibrillation, we could discuss further whether or not to remain on Coumadin. Dr. Aundra Dubin saw her today as well. We both explained to her that she will likely remain on Coumadin long-term.  If she has recurrent symptomatic atrial fibrillation, we may need to consider something like Tikosyn.    -  Give Rx for Metoprolol Tartrate 12.5 mg to take prn for palpitations.  She may repeat x 1 after 1 hour.  3.  Coronary Artery Disease:  She had non-obs CAD at cath.  Continue ASA and statin.    4.  Hyperlipidemia:  Continue statin.  5.  Orthostatic Hypotension: She does have a  fairly significant blood pressure drop from lying to standing. Heart rate remains fairly stable. I have recommended that she continue to push fluids. She should wear compression stockings from morning to night. She may increase her salt intake just slightly.    Disposition:   FU with Dr. Loralie Champagne 2 mos.   Signed, Versie Starks, MHS 08/10/2014 1:59 PM    [facility] [place], [place], [place]  [address] Phone: [phone]; Fax: [phone]

## 2014-08-10 NOTE — Patient Instructions (Signed)
AN RX FOR METOPROLOL TARTRATE 25 MG TABLET WITH THE DIRECTIONS TO TAKE 1/2 TAB = 12.5 MG AS NEEDED FOR PALPITATIONS; MAY TAKE EXTRA 12.5 MG 1 HOUR LATER IF STILL HAVING PALPITATIONS.  YOU HAVE BEEN GIVEN AN RX FOR AZITHROMYCIN 250 MG TAB WITH THE DIRECTIONS TO TAKE 2 TABS 30-60 MINUTES BEFORE DENTAL PROCEDURE  Your physician recommends that you schedule a follow-up appointment in: 10/18/14 @ 1:45 WITH DR. Aundra Dubin

## 2014-08-11 ENCOUNTER — Encounter (HOSPITAL_COMMUNITY)
Admission: RE | Admit: 2014-08-11 | Discharge: 2014-08-11 | Disposition: A | Discharge status: Home / Self Care | Payer: Medicare Other | Source: Ambulatory Visit | Attending: Cardiology | Admitting: Cardiology

## 2014-08-11 DIAGNOSIS — Z5189 Encounter for other specified aftercare: Secondary | ICD-10-CM | POA: Diagnosis not present

## 2014-08-14 ENCOUNTER — Telehealth: Payer: Self-pay | Admitting: *Deleted

## 2014-08-14 ENCOUNTER — Encounter (HOSPITAL_COMMUNITY)
Admission: RE | Admit: 2014-08-14 | Discharge: 2014-08-14 | Disposition: A | Discharge status: Home / Self Care | Payer: Medicare Other | Source: Ambulatory Visit | Attending: Cardiology | Admitting: Cardiology

## 2014-08-14 DIAGNOSIS — Z5189 Encounter for other specified aftercare: Secondary | ICD-10-CM | POA: Diagnosis not present

## 2014-08-14 NOTE — Telephone Encounter (Signed)
lmom labs ok, continue current treatment plan

## 2014-08-16 ENCOUNTER — Encounter (HOSPITAL_COMMUNITY)
Admission: RE | Admit: 2014-08-16 | Discharge: 2014-08-16 | Disposition: A | Discharge status: Home / Self Care | Payer: Medicare Other | Source: Ambulatory Visit | Attending: Cardiology | Admitting: Cardiology

## 2014-08-16 ENCOUNTER — Telehealth: Payer: Self-pay | Admitting: Physician Assistant

## 2014-08-16 DIAGNOSIS — Z5189 Encounter for other specified aftercare: Secondary | ICD-10-CM | POA: Diagnosis not present

## 2014-08-16 NOTE — Progress Notes (Signed)
Patient reports feeling "swimmy headed" most of the time even though her amiodarone has been discontinued. Patient brought a bottle of dramamine in to cardiac rehab and wants to know if she can take it. Patient says she has felt this way ever since her surgery.  Orthostatic blood pressures obtained. Lying blood pressure 131/69 heart rate 65. Sitting blood pressure 131/67 heart rate 65. Standing blood pressure 122/73 heart rate 72. Patient reports that she still is not driving because she feels "dopey." Dr Claris Gladden office called and notified.  Spoke with [employer]. Will fax over today's ECG tracing with orthostatic blood pressures. Dr Claris Gladden office to call the patient back.

## 2014-08-16 NOTE — Telephone Encounter (Signed)
Spoke with Verdis Frederickson about patient wanting to take Dramamine. Told her she has been "swimmy headed' since her AVR in October. Advised her no, not until we discuss with Dr. Aundra Dubin. Bp 131/67, pulse 65 sitting, 122/73 pulse 72 standing.  Will forward to Dr. Aundra Dubin.

## 2014-08-16 NOTE — Telephone Encounter (Signed)
New Message  Becky Winters from Cardiac Rehab requested to speak with a Triage Rn about Pt. Pt in for rehab and complained about "Swimmy" feeling in the head and wondered if she could take dramamine. Becky Winters will take Orthostatic Bp in meantime. Please call back and discuss.

## 2014-08-17 NOTE — Telephone Encounter (Signed)
May be BP-related, probably would hold off on Dramamine.  BP seems to be getting better.

## 2014-08-17 NOTE — Telephone Encounter (Signed)
Notified Verdis Frederickson in cardiac rehab per Dr. Aundra Dubin advised not to take Dramamine.  Also LM for pt at home number to hold Dramamine.

## 2014-08-18 ENCOUNTER — Encounter (HOSPITAL_COMMUNITY): Payer: Medicare Other

## 2014-08-21 ENCOUNTER — Encounter (HOSPITAL_COMMUNITY)
Admission: RE | Admit: 2014-08-21 | Discharge: 2014-08-21 | Disposition: A | Discharge status: Home / Self Care | Payer: Medicare Other | Source: Ambulatory Visit | Attending: Cardiology | Admitting: Cardiology

## 2014-08-21 ENCOUNTER — Ambulatory Visit (INDEPENDENT_AMBULATORY_CARE_PROVIDER_SITE_OTHER): Payer: Medicare Other

## 2014-08-21 DIAGNOSIS — Z954 Presence of other heart-valve replacement: Secondary | ICD-10-CM | POA: Diagnosis not present

## 2014-08-21 DIAGNOSIS — Z5181 Encounter for therapeutic drug level monitoring: Secondary | ICD-10-CM

## 2014-08-21 DIAGNOSIS — I9789 Other postprocedural complications and disorders of the circulatory system, not elsewhere classified: Secondary | ICD-10-CM

## 2014-08-21 DIAGNOSIS — I4891 Unspecified atrial fibrillation: Secondary | ICD-10-CM

## 2014-08-21 DIAGNOSIS — Z953 Presence of xenogenic heart valve: Secondary | ICD-10-CM

## 2014-08-21 DIAGNOSIS — Q231 Congenital insufficiency of aortic valve: Secondary | ICD-10-CM

## 2014-08-21 DIAGNOSIS — Z5189 Encounter for other specified aftercare: Secondary | ICD-10-CM | POA: Diagnosis not present

## 2014-08-21 LAB — POCT INR: INR: 2.6

## 2014-08-23 ENCOUNTER — Encounter (HOSPITAL_COMMUNITY)
Admission: RE | Admit: 2014-08-23 | Discharge: 2014-08-23 | Disposition: A | Discharge status: Home / Self Care | Payer: Medicare Other | Source: Ambulatory Visit | Attending: Cardiology | Admitting: Cardiology

## 2014-08-23 DIAGNOSIS — Z5189 Encounter for other specified aftercare: Secondary | ICD-10-CM | POA: Diagnosis not present

## 2014-08-25 ENCOUNTER — Encounter (HOSPITAL_COMMUNITY): Payer: Medicare Other

## 2014-08-28 ENCOUNTER — Encounter (HOSPITAL_COMMUNITY)
Admission: RE | Admit: 2014-08-28 | Discharge: 2014-08-28 | Disposition: A | Discharge status: Home / Self Care | Payer: Medicare Other | Source: Ambulatory Visit | Attending: Cardiology | Admitting: Cardiology

## 2014-08-28 DIAGNOSIS — F419 Anxiety disorder, unspecified: Secondary | ICD-10-CM | POA: Diagnosis not present

## 2014-08-28 DIAGNOSIS — Z881 Allergy status to other antibiotic agents status: Secondary | ICD-10-CM | POA: Diagnosis not present

## 2014-08-28 DIAGNOSIS — I350 Nonrheumatic aortic (valve) stenosis: Secondary | ICD-10-CM | POA: Insufficient documentation

## 2014-08-28 DIAGNOSIS — I480 Paroxysmal atrial fibrillation: Secondary | ICD-10-CM | POA: Diagnosis not present

## 2014-08-28 DIAGNOSIS — I5032 Chronic diastolic (congestive) heart failure: Secondary | ICD-10-CM | POA: Diagnosis not present

## 2014-08-28 DIAGNOSIS — E039 Hypothyroidism, unspecified: Secondary | ICD-10-CM | POA: Insufficient documentation

## 2014-08-28 DIAGNOSIS — Z8673 Personal history of transient ischemic attack (TIA), and cerebral infarction without residual deficits: Secondary | ICD-10-CM | POA: Diagnosis not present

## 2014-08-28 DIAGNOSIS — Z882 Allergy status to sulfonamides status: Secondary | ICD-10-CM | POA: Diagnosis not present

## 2014-08-28 DIAGNOSIS — K219 Gastro-esophageal reflux disease without esophagitis: Secondary | ICD-10-CM | POA: Insufficient documentation

## 2014-08-28 DIAGNOSIS — Z952 Presence of prosthetic heart valve: Secondary | ICD-10-CM | POA: Insufficient documentation

## 2014-08-28 DIAGNOSIS — Z889 Allergy status to unspecified drugs, medicaments and biological substances status: Secondary | ICD-10-CM | POA: Diagnosis not present

## 2014-08-28 DIAGNOSIS — E785 Hyperlipidemia, unspecified: Secondary | ICD-10-CM | POA: Insufficient documentation

## 2014-08-28 DIAGNOSIS — Z885 Allergy status to narcotic agent status: Secondary | ICD-10-CM | POA: Insufficient documentation

## 2014-08-28 DIAGNOSIS — Z8249 Family history of ischemic heart disease and other diseases of the circulatory system: Secondary | ICD-10-CM | POA: Insufficient documentation

## 2014-08-28 DIAGNOSIS — Z5189 Encounter for other specified aftercare: Secondary | ICD-10-CM | POA: Insufficient documentation

## 2014-08-28 DIAGNOSIS — M159 Polyosteoarthritis, unspecified: Secondary | ICD-10-CM | POA: Insufficient documentation

## 2014-08-30 ENCOUNTER — Encounter (HOSPITAL_COMMUNITY)
Admission: RE | Admit: 2014-08-30 | Discharge: 2014-08-30 | Disposition: A | Discharge status: Home / Self Care | Payer: Medicare Other | Source: Ambulatory Visit | Attending: Cardiology | Admitting: Cardiology

## 2014-08-30 DIAGNOSIS — I350 Nonrheumatic aortic (valve) stenosis: Secondary | ICD-10-CM | POA: Diagnosis not present

## 2014-08-30 DIAGNOSIS — Z952 Presence of prosthetic heart valve: Secondary | ICD-10-CM | POA: Diagnosis not present

## 2014-08-30 DIAGNOSIS — I480 Paroxysmal atrial fibrillation: Secondary | ICD-10-CM | POA: Diagnosis not present

## 2014-08-30 DIAGNOSIS — F419 Anxiety disorder, unspecified: Secondary | ICD-10-CM | POA: Diagnosis not present

## 2014-08-30 DIAGNOSIS — I5032 Chronic diastolic (congestive) heart failure: Secondary | ICD-10-CM | POA: Diagnosis not present

## 2014-08-30 DIAGNOSIS — Z5189 Encounter for other specified aftercare: Secondary | ICD-10-CM | POA: Diagnosis not present

## 2014-09-01 ENCOUNTER — Encounter (HOSPITAL_COMMUNITY)
Admission: RE | Admit: 2014-09-01 | Discharge: 2014-09-01 | Disposition: A | Discharge status: Home / Self Care | Payer: Medicare Other | Source: Ambulatory Visit | Attending: Cardiology | Admitting: Cardiology

## 2014-09-01 DIAGNOSIS — F419 Anxiety disorder, unspecified: Secondary | ICD-10-CM | POA: Diagnosis not present

## 2014-09-01 DIAGNOSIS — I350 Nonrheumatic aortic (valve) stenosis: Secondary | ICD-10-CM | POA: Diagnosis not present

## 2014-09-01 DIAGNOSIS — I480 Paroxysmal atrial fibrillation: Secondary | ICD-10-CM | POA: Diagnosis not present

## 2014-09-01 DIAGNOSIS — Z5189 Encounter for other specified aftercare: Secondary | ICD-10-CM | POA: Diagnosis not present

## 2014-09-01 DIAGNOSIS — I5032 Chronic diastolic (congestive) heart failure: Secondary | ICD-10-CM | POA: Diagnosis not present

## 2014-09-01 DIAGNOSIS — Z952 Presence of prosthetic heart valve: Secondary | ICD-10-CM | POA: Diagnosis not present

## 2014-09-04 ENCOUNTER — Encounter (HOSPITAL_COMMUNITY)
Admission: RE | Admit: 2014-09-04 | Discharge: 2014-09-04 | Disposition: A | Discharge status: Home / Self Care | Payer: Medicare Other | Source: Ambulatory Visit | Attending: Cardiology | Admitting: Cardiology

## 2014-09-04 ENCOUNTER — Other Ambulatory Visit: Payer: Self-pay

## 2014-09-04 DIAGNOSIS — I480 Paroxysmal atrial fibrillation: Secondary | ICD-10-CM | POA: Diagnosis not present

## 2014-09-04 DIAGNOSIS — F419 Anxiety disorder, unspecified: Secondary | ICD-10-CM | POA: Diagnosis not present

## 2014-09-04 DIAGNOSIS — I5032 Chronic diastolic (congestive) heart failure: Secondary | ICD-10-CM | POA: Diagnosis not present

## 2014-09-04 DIAGNOSIS — Z5189 Encounter for other specified aftercare: Secondary | ICD-10-CM | POA: Diagnosis not present

## 2014-09-04 DIAGNOSIS — I350 Nonrheumatic aortic (valve) stenosis: Secondary | ICD-10-CM | POA: Diagnosis not present

## 2014-09-04 DIAGNOSIS — Z952 Presence of prosthetic heart valve: Secondary | ICD-10-CM | POA: Diagnosis not present

## 2014-09-04 MED ORDER — WARFARIN SODIUM 2.5 MG PO TABS: ORAL_TABLET | ORAL | Status: DC

## 2014-09-06 ENCOUNTER — Encounter (HOSPITAL_COMMUNITY)
Admission: RE | Admit: 2014-09-06 | Discharge: 2014-09-06 | Disposition: A | Discharge status: Home / Self Care | Payer: Medicare Other | Source: Ambulatory Visit | Attending: Cardiology | Admitting: Cardiology

## 2014-09-06 DIAGNOSIS — Z5189 Encounter for other specified aftercare: Secondary | ICD-10-CM | POA: Diagnosis not present

## 2014-09-06 DIAGNOSIS — I480 Paroxysmal atrial fibrillation: Secondary | ICD-10-CM | POA: Diagnosis not present

## 2014-09-06 DIAGNOSIS — Z952 Presence of prosthetic heart valve: Secondary | ICD-10-CM | POA: Diagnosis not present

## 2014-09-06 DIAGNOSIS — F419 Anxiety disorder, unspecified: Secondary | ICD-10-CM | POA: Diagnosis not present

## 2014-09-06 DIAGNOSIS — I350 Nonrheumatic aortic (valve) stenosis: Secondary | ICD-10-CM | POA: Diagnosis not present

## 2014-09-06 DIAGNOSIS — I5032 Chronic diastolic (congestive) heart failure: Secondary | ICD-10-CM | POA: Diagnosis not present

## 2014-09-07 DIAGNOSIS — Z803 Family history of malignant neoplasm of breast: Secondary | ICD-10-CM | POA: Diagnosis not present

## 2014-09-07 DIAGNOSIS — Z1231 Encounter for screening mammogram for malignant neoplasm of breast: Secondary | ICD-10-CM | POA: Diagnosis not present

## 2014-09-08 ENCOUNTER — Encounter (HOSPITAL_COMMUNITY)
Admission: RE | Admit: 2014-09-08 | Discharge: 2014-09-08 | Disposition: A | Discharge status: Home / Self Care | Payer: Medicare Other | Source: Ambulatory Visit | Attending: Cardiology | Admitting: Cardiology

## 2014-09-08 DIAGNOSIS — Z5189 Encounter for other specified aftercare: Secondary | ICD-10-CM | POA: Diagnosis not present

## 2014-09-08 DIAGNOSIS — F419 Anxiety disorder, unspecified: Secondary | ICD-10-CM | POA: Diagnosis not present

## 2014-09-08 DIAGNOSIS — I480 Paroxysmal atrial fibrillation: Secondary | ICD-10-CM | POA: Diagnosis not present

## 2014-09-08 DIAGNOSIS — I5032 Chronic diastolic (congestive) heart failure: Secondary | ICD-10-CM | POA: Diagnosis not present

## 2014-09-08 DIAGNOSIS — Z952 Presence of prosthetic heart valve: Secondary | ICD-10-CM | POA: Diagnosis not present

## 2014-09-08 DIAGNOSIS — I350 Nonrheumatic aortic (valve) stenosis: Secondary | ICD-10-CM | POA: Diagnosis not present

## 2014-09-11 ENCOUNTER — Ambulatory Visit (INDEPENDENT_AMBULATORY_CARE_PROVIDER_SITE_OTHER): Payer: Medicare Other

## 2014-09-11 ENCOUNTER — Ambulatory Visit: Payer: Medicare Other | Admitting: Thoracic Surgery (Cardiothoracic Vascular Surgery)

## 2014-09-11 DIAGNOSIS — Z5181 Encounter for therapeutic drug level monitoring: Secondary | ICD-10-CM | POA: Diagnosis not present

## 2014-09-11 DIAGNOSIS — Z954 Presence of other heart-valve replacement: Secondary | ICD-10-CM | POA: Diagnosis not present

## 2014-09-11 DIAGNOSIS — I4891 Unspecified atrial fibrillation: Secondary | ICD-10-CM

## 2014-09-11 DIAGNOSIS — I9789 Other postprocedural complications and disorders of the circulatory system, not elsewhere classified: Secondary | ICD-10-CM

## 2014-09-11 DIAGNOSIS — Q231 Congenital insufficiency of aortic valve: Secondary | ICD-10-CM | POA: Diagnosis not present

## 2014-09-11 DIAGNOSIS — Z953 Presence of xenogenic heart valve: Secondary | ICD-10-CM

## 2014-09-11 LAB — POCT INR: INR: 2.6

## 2014-09-13 ENCOUNTER — Encounter (HOSPITAL_COMMUNITY)
Admission: RE | Admit: 2014-09-13 | Discharge: 2014-09-13 | Disposition: A | Discharge status: Home / Self Care | Payer: Medicare Other | Source: Ambulatory Visit | Attending: Cardiology | Admitting: Cardiology

## 2014-09-13 DIAGNOSIS — I480 Paroxysmal atrial fibrillation: Secondary | ICD-10-CM | POA: Diagnosis not present

## 2014-09-13 DIAGNOSIS — I5032 Chronic diastolic (congestive) heart failure: Secondary | ICD-10-CM | POA: Diagnosis not present

## 2014-09-13 DIAGNOSIS — Z952 Presence of prosthetic heart valve: Secondary | ICD-10-CM | POA: Diagnosis not present

## 2014-09-13 DIAGNOSIS — I350 Nonrheumatic aortic (valve) stenosis: Secondary | ICD-10-CM | POA: Diagnosis not present

## 2014-09-13 DIAGNOSIS — Z5189 Encounter for other specified aftercare: Secondary | ICD-10-CM | POA: Diagnosis not present

## 2014-09-13 DIAGNOSIS — F419 Anxiety disorder, unspecified: Secondary | ICD-10-CM | POA: Diagnosis not present

## 2014-09-18 ENCOUNTER — Encounter (HOSPITAL_COMMUNITY)
Admission: RE | Admit: 2014-09-18 | Discharge: 2014-09-18 | Disposition: A | Discharge status: Home / Self Care | Payer: Medicare Other | Source: Ambulatory Visit | Attending: Cardiology | Admitting: Cardiology

## 2014-09-18 ENCOUNTER — Ambulatory Visit: Payer: Medicare Other | Admitting: Thoracic Surgery (Cardiothoracic Vascular Surgery)

## 2014-09-18 DIAGNOSIS — I5032 Chronic diastolic (congestive) heart failure: Secondary | ICD-10-CM | POA: Diagnosis not present

## 2014-09-18 DIAGNOSIS — F419 Anxiety disorder, unspecified: Secondary | ICD-10-CM | POA: Diagnosis not present

## 2014-09-18 DIAGNOSIS — Z5189 Encounter for other specified aftercare: Secondary | ICD-10-CM | POA: Diagnosis not present

## 2014-09-18 DIAGNOSIS — Z952 Presence of prosthetic heart valve: Secondary | ICD-10-CM | POA: Diagnosis not present

## 2014-09-18 DIAGNOSIS — I480 Paroxysmal atrial fibrillation: Secondary | ICD-10-CM | POA: Diagnosis not present

## 2014-09-18 DIAGNOSIS — I350 Nonrheumatic aortic (valve) stenosis: Secondary | ICD-10-CM | POA: Diagnosis not present

## 2014-09-19 DIAGNOSIS — H532 Diplopia: Secondary | ICD-10-CM | POA: Diagnosis not present

## 2014-09-19 DIAGNOSIS — H52203 Unspecified astigmatism, bilateral: Secondary | ICD-10-CM | POA: Diagnosis not present

## 2014-09-20 ENCOUNTER — Encounter (HOSPITAL_COMMUNITY)
Admission: RE | Admit: 2014-09-20 | Discharge: 2014-09-20 | Disposition: A | Discharge status: Home / Self Care | Payer: Medicare Other | Source: Ambulatory Visit | Attending: Cardiology | Admitting: Cardiology

## 2014-09-20 DIAGNOSIS — I350 Nonrheumatic aortic (valve) stenosis: Secondary | ICD-10-CM | POA: Diagnosis not present

## 2014-09-20 DIAGNOSIS — Z5189 Encounter for other specified aftercare: Secondary | ICD-10-CM | POA: Diagnosis not present

## 2014-09-20 DIAGNOSIS — I5032 Chronic diastolic (congestive) heart failure: Secondary | ICD-10-CM | POA: Diagnosis not present

## 2014-09-20 DIAGNOSIS — Z952 Presence of prosthetic heart valve: Secondary | ICD-10-CM | POA: Diagnosis not present

## 2014-09-20 DIAGNOSIS — I480 Paroxysmal atrial fibrillation: Secondary | ICD-10-CM | POA: Diagnosis not present

## 2014-09-20 DIAGNOSIS — F419 Anxiety disorder, unspecified: Secondary | ICD-10-CM | POA: Diagnosis not present

## 2014-09-20 NOTE — Progress Notes (Signed)
Post exercise blood pressure 91/60.  Patient asymptomatic.  Given Gatorade repeat blood pressure 105/57. Will fax exercise flow sheets to Dr. Claris Gladden office for review.

## 2014-09-22 ENCOUNTER — Encounter (HOSPITAL_COMMUNITY)
Admission: RE | Admit: 2014-09-22 | Discharge: 2014-09-22 | Disposition: A | Discharge status: Home / Self Care | Payer: Medicare Other | Source: Ambulatory Visit | Attending: Cardiology | Admitting: Cardiology

## 2014-09-22 DIAGNOSIS — F419 Anxiety disorder, unspecified: Secondary | ICD-10-CM | POA: Diagnosis not present

## 2014-09-22 DIAGNOSIS — I480 Paroxysmal atrial fibrillation: Secondary | ICD-10-CM | POA: Diagnosis not present

## 2014-09-22 DIAGNOSIS — I5032 Chronic diastolic (congestive) heart failure: Secondary | ICD-10-CM | POA: Diagnosis not present

## 2014-09-22 DIAGNOSIS — I350 Nonrheumatic aortic (valve) stenosis: Secondary | ICD-10-CM | POA: Diagnosis not present

## 2014-09-22 DIAGNOSIS — Z5189 Encounter for other specified aftercare: Secondary | ICD-10-CM | POA: Diagnosis not present

## 2014-09-22 DIAGNOSIS — Z952 Presence of prosthetic heart valve: Secondary | ICD-10-CM | POA: Diagnosis not present

## 2014-09-25 ENCOUNTER — Encounter (HOSPITAL_COMMUNITY)
Admission: RE | Admit: 2014-09-25 | Discharge: 2014-09-25 | Disposition: A | Discharge status: Home / Self Care | Payer: Medicare Other | Source: Ambulatory Visit | Attending: Cardiology | Admitting: Cardiology

## 2014-09-25 DIAGNOSIS — Z8673 Personal history of transient ischemic attack (TIA), and cerebral infarction without residual deficits: Secondary | ICD-10-CM | POA: Diagnosis not present

## 2014-09-25 DIAGNOSIS — Z5189 Encounter for other specified aftercare: Secondary | ICD-10-CM | POA: Diagnosis not present

## 2014-09-25 DIAGNOSIS — I480 Paroxysmal atrial fibrillation: Secondary | ICD-10-CM | POA: Diagnosis not present

## 2014-09-25 DIAGNOSIS — I350 Nonrheumatic aortic (valve) stenosis: Secondary | ICD-10-CM | POA: Diagnosis not present

## 2014-09-25 DIAGNOSIS — Z885 Allergy status to narcotic agent status: Secondary | ICD-10-CM | POA: Diagnosis not present

## 2014-09-25 DIAGNOSIS — E039 Hypothyroidism, unspecified: Secondary | ICD-10-CM | POA: Insufficient documentation

## 2014-09-25 DIAGNOSIS — K219 Gastro-esophageal reflux disease without esophagitis: Secondary | ICD-10-CM | POA: Insufficient documentation

## 2014-09-25 DIAGNOSIS — M159 Polyosteoarthritis, unspecified: Secondary | ICD-10-CM | POA: Diagnosis not present

## 2014-09-25 DIAGNOSIS — E785 Hyperlipidemia, unspecified: Secondary | ICD-10-CM | POA: Diagnosis not present

## 2014-09-25 DIAGNOSIS — Z8249 Family history of ischemic heart disease and other diseases of the circulatory system: Secondary | ICD-10-CM | POA: Diagnosis not present

## 2014-09-25 DIAGNOSIS — Z889 Allergy status to unspecified drugs, medicaments and biological substances status: Secondary | ICD-10-CM | POA: Insufficient documentation

## 2014-09-25 DIAGNOSIS — Z952 Presence of prosthetic heart valve: Secondary | ICD-10-CM | POA: Insufficient documentation

## 2014-09-25 DIAGNOSIS — F419 Anxiety disorder, unspecified: Secondary | ICD-10-CM | POA: Diagnosis not present

## 2014-09-25 DIAGNOSIS — Z881 Allergy status to other antibiotic agents status: Secondary | ICD-10-CM | POA: Insufficient documentation

## 2014-09-25 DIAGNOSIS — Z882 Allergy status to sulfonamides status: Secondary | ICD-10-CM | POA: Insufficient documentation

## 2014-09-25 DIAGNOSIS — I5032 Chronic diastolic (congestive) heart failure: Secondary | ICD-10-CM | POA: Diagnosis not present

## 2014-09-27 ENCOUNTER — Encounter (HOSPITAL_COMMUNITY)
Admission: RE | Admit: 2014-09-27 | Discharge: 2014-09-27 | Disposition: A | Discharge status: Home / Self Care | Payer: Medicare Other | Source: Ambulatory Visit | Attending: Cardiology | Admitting: Cardiology

## 2014-09-27 DIAGNOSIS — Z5189 Encounter for other specified aftercare: Secondary | ICD-10-CM | POA: Diagnosis not present

## 2014-09-27 DIAGNOSIS — I350 Nonrheumatic aortic (valve) stenosis: Secondary | ICD-10-CM | POA: Diagnosis not present

## 2014-09-27 DIAGNOSIS — Z952 Presence of prosthetic heart valve: Secondary | ICD-10-CM | POA: Diagnosis not present

## 2014-09-27 DIAGNOSIS — I480 Paroxysmal atrial fibrillation: Secondary | ICD-10-CM | POA: Diagnosis not present

## 2014-09-27 DIAGNOSIS — I5032 Chronic diastolic (congestive) heart failure: Secondary | ICD-10-CM | POA: Diagnosis not present

## 2014-09-27 DIAGNOSIS — F419 Anxiety disorder, unspecified: Secondary | ICD-10-CM | POA: Diagnosis not present

## 2014-09-29 ENCOUNTER — Encounter (HOSPITAL_COMMUNITY)
Admission: RE | Admit: 2014-09-29 | Discharge: 2014-09-29 | Disposition: A | Discharge status: Home / Self Care | Payer: Medicare Other | Source: Ambulatory Visit | Attending: Cardiology | Admitting: Cardiology

## 2014-09-29 DIAGNOSIS — I480 Paroxysmal atrial fibrillation: Secondary | ICD-10-CM | POA: Diagnosis not present

## 2014-09-29 DIAGNOSIS — I5032 Chronic diastolic (congestive) heart failure: Secondary | ICD-10-CM | POA: Diagnosis not present

## 2014-09-29 DIAGNOSIS — Z5189 Encounter for other specified aftercare: Secondary | ICD-10-CM | POA: Diagnosis not present

## 2014-09-29 DIAGNOSIS — Z952 Presence of prosthetic heart valve: Secondary | ICD-10-CM | POA: Diagnosis not present

## 2014-09-29 DIAGNOSIS — I350 Nonrheumatic aortic (valve) stenosis: Secondary | ICD-10-CM | POA: Diagnosis not present

## 2014-09-29 DIAGNOSIS — F419 Anxiety disorder, unspecified: Secondary | ICD-10-CM | POA: Diagnosis not present

## 2014-10-02 ENCOUNTER — Encounter: Payer: Self-pay | Admitting: Thoracic Surgery (Cardiothoracic Vascular Surgery)

## 2014-10-02 ENCOUNTER — Ambulatory Visit (INDEPENDENT_AMBULATORY_CARE_PROVIDER_SITE_OTHER): Payer: Medicare Other | Admitting: Thoracic Surgery (Cardiothoracic Vascular Surgery)

## 2014-10-02 ENCOUNTER — Encounter (HOSPITAL_COMMUNITY)
Admission: RE | Admit: 2014-10-02 | Discharge: 2014-10-02 | Disposition: A | Discharge status: Home / Self Care | Payer: Medicare Other | Source: Ambulatory Visit | Attending: Cardiology | Admitting: Cardiology

## 2014-10-02 VITALS — BP 121/76 | HR 74 | Resp 16 | Ht 64.5 in | Wt 160.0 lb

## 2014-10-02 DIAGNOSIS — Z5189 Encounter for other specified aftercare: Secondary | ICD-10-CM | POA: Diagnosis not present

## 2014-10-02 DIAGNOSIS — I480 Paroxysmal atrial fibrillation: Secondary | ICD-10-CM | POA: Diagnosis not present

## 2014-10-02 DIAGNOSIS — Q231 Congenital insufficiency of aortic valve: Secondary | ICD-10-CM | POA: Diagnosis not present

## 2014-10-02 DIAGNOSIS — Z954 Presence of other heart-valve replacement: Secondary | ICD-10-CM

## 2014-10-02 DIAGNOSIS — F419 Anxiety disorder, unspecified: Secondary | ICD-10-CM | POA: Diagnosis not present

## 2014-10-02 DIAGNOSIS — Z953 Presence of xenogenic heart valve: Secondary | ICD-10-CM

## 2014-10-02 DIAGNOSIS — I350 Nonrheumatic aortic (valve) stenosis: Secondary | ICD-10-CM

## 2014-10-02 DIAGNOSIS — I5032 Chronic diastolic (congestive) heart failure: Secondary | ICD-10-CM | POA: Diagnosis not present

## 2014-10-02 DIAGNOSIS — Z952 Presence of prosthetic heart valve: Secondary | ICD-10-CM | POA: Diagnosis not present

## 2014-10-02 NOTE — Patient Instructions (Addendum)
  Endocarditis is a potentially serious infection of heart valves or inside lining of the heart.  It occurs more commonly in patients with diseased heart valves (such as patient's with aortic or mitral valve disease) and in patients who have undergone heart valve repair or replacement.  Certain surgical and dental procedures may put you at risk, such as dental cleaning, other dental procedures, or any surgery involving the respiratory, urinary, gastrointestinal tract, gallbladder or prostate gland.   To minimize your chances for develooping endocarditis, maintain good oral health and seek prompt medical attention for any infections involving the mouth, teeth, gums, skin or urinary tract.  Always notify your doctor or dentist about your underlying heart valve condition before having any invasive procedures. You will need to take antibiotics before certain procedures.    Patient may resume unrestricted physical activity without any particular limitations at this time.

## 2014-10-02 NOTE — Progress Notes (Signed)
[address]       [place] [address]             [phone]     CARDIOTHORACIC SURGERY OFFICE NOTE  Referring Provider is Larey Dresser, MD PCP is Sheela Stack, MD   HPI:  Patient returns for routine follow-up status post aortic valve replacement using a bioprosthetic tissue valve on 06/07/2014.  Her postoperative recovery was overall uncomplicated although notable for the development of atrial fibrillation.  She was last seen here in the office on 07/03/2014 at which time she was maintaining sinus rhythm on oral amiodarone and coumadin.  Amiodarone was stopped in early December because of dizziness and double vision.  She was seen in follow-up by Richardson Dopp on 08/10/2014 at which time she was still maintaining sinus rhythm.  She returns to the office today for further follow-up.  She states that overall she continues to gradually improve. She still has some intermittent dizzy spells without syncope. She denies any exertional shortness of breath. She has been participating in the cardiac rehabilitation program and making fairly decent progress. She is still concerned about her resting pulse and blood pressure, although her systolic blood pressure is never below 100 and her resting pulse tends to remain in the 80s. She has not been taking any beta blocker nor other cardiac medication. She does remain on Coumadin. She has not had any bleeding complications.   Current Outpatient Prescriptions  Medication Sig Dispense Refill  . ALPRAZolam (XANAX) 0.25 MG tablet Take 0.25 mg by mouth daily as needed for sleep.     Marland Kitchen atorvastatin (LIPITOR) 40 MG tablet Take 20 mg by mouth at bedtime.     Marland Kitchen azithromycin (ZITHROMAX) 250 MG tablet Take 2 tablets 30-60 minutes before dental procedure (Patient not taking: Reported on 08/16/2014) 2 each 2  . b complex vitamins tablet Take 1 tablet by mouth daily.     . beta carotene w/minerals (OCUVITE) tablet Take 1 tablet by mouth 2  (two) times daily.     . cholecalciferol (VITAMIN D) 1000 UNITS tablet Take 1,000 Units by mouth 2 (two) times daily.     . Cinnamon 500 MG TABS Take 1 tablet by mouth 2 (two) times daily.     Marland Kitchen co-enzyme Q-10 50 MG capsule Take 100 mg by mouth 2 (two) times daily.    Marland Kitchen doxylamine, Sleep, (SLEEP AID) 25 MG tablet Take 50 mg by mouth at bedtime as needed for sleep.     . folic acid (FOLVITE) 672 MCG tablet Take 800 mcg by mouth daily.     Marland Kitchen levothyroxine (SYNTHROID, LEVOTHROID) 125 MCG tablet Take 125 mcg by mouth daily. Patient takes every day    . Melatonin 3 MG TABS Take 1 tablet by mouth at bedtime.     . metoprolol tartrate (LOPRESSOR) 25 MG tablet Take 0.5 tablets (12.5 mg total) by mouth as needed (Take 12.5 mg for palpitations; may repeat another 12.5 mg 1 hour later if needed). (Patient not taking: Reported on 08/16/2014) 30 tablet 3  . Multiple Vitamins-Minerals (HAIR/SKIN/NAILS PO) Take 1 tablet by mouth daily.     . Omega-3 Fatty Acids (FISH OIL PO) Take 1,400 mg by mouth 2 (two) times daily.    Marland Kitchen warfarin (COUMADIN) 2.5 MG tablet Take 2.5 mg po daily or as directed by [facility] 30 tablet 3   No current facility-administered medications for this visit.      Physical Exam:  Ht 5' 4.5" (1.638 m)  Wt 160 lb (72.576 kg)  BMI 27.05 kg/m2  General:  Well-appearing  Chest:   Clear  CV:   Regular rate and rhythm without murmur  Incisions:  Completely healed, sternum is stable  Abdomen:  Soft and nontender  Extremities:  Warm and well-perfused  Diagnostic Tests:  Transthoracic Echocardiography  Patient:  Mara, Favero MR #:    [id number] Study Date: 07/25/2014 Gender:   F Age:    74 Height:   165.1 cm Weight:   71.2 kg BSA:    1.82 m^2 Pt. Status: Room:  ATTENDING  Darlin Coco SONOGRAPHER [place], Will Faylene Million, Scott T REFERRING  [place], [place] T REFERRING  Loralie Champagne, M.D. PERFORMING  [facility],  Outpatient  cc:  ------------------------------------------------------------------- LV EF: 55%  ------------------------------------------------------------------- Indications:   (Z95.4).  ------------------------------------------------------------------- History:  PMH: S/p AVR. Acquired from the patient and from the patient&'s chart. Atrial fibrillation. Risk factors: Dyslipidemia.  ------------------------------------------------------------------- Study Conclusions  - Left ventricle: Abnormal septal motion. The cavity size was normal. Wall thickness was increased in a pattern of mild LVH. The estimated ejection fraction was 55%. - Aortic valve: Normal appearing bioprosthetic tissue valve with normal systolic gradients and no peri-valvular regurgitation. - Mitral valve: There was mild regurgitation. - Left atrium: The atrium was mildly dilated. - Atrial septum: No defect or patent foramen ovale was identified.  Transthoracic echocardiography. M-mode, complete 2D, spectral Doppler, and color Doppler. Birthdate: Patient birthdate: [birth year]. Age: Patient is 74 yr old. Sex: Gender: female. BMI: 26.1 kg/m^2. Blood pressure:   138/72 Patient status: Outpatient. Study date: Study date: 07/25/2014. Study time: 08:46 AM. Location: [facility]  -------------------------------------------------------------------  ------------------------------------------------------------------- Left ventricle: Abnormal septal motion. The cavity size was normal. Wall thickness was increased in a pattern of mild LVH. The estimated ejection fraction was 55%.  ------------------------------------------------------------------- Aortic valve: Normal appearing bioprosthetic tissue valve with normal systolic gradients and no peri-valvular regurgitation. Doppler:   VTI ratio of LVOT to aortic valve: 0.45. Valve area (VTI): 1.42 cm^2. Indexed valve area (VTI):  0.78 cm^2/m^2. Valve area (Vmax): 1.42 cm^2. Indexed valve area (Vmax): 0.78 cm^2/m^2. Mean velocity ratio of LVOT to aortic valve: 0.44. Valve area (Vmean): 1.37 cm^2. Indexed valve area (Vmean): 0.75 cm^2/m^2. Mean gradient (S): 10 mm Hg. Peak gradient (S): 18 mm Hg.  ------------------------------------------------------------------- Mitral valve:  Mildly thickened leaflets . Doppler: There was mild regurgitation.  Peak gradient (D): 6 mm Hg.  ------------------------------------------------------------------- Left atrium: The atrium was mildly dilated.  ------------------------------------------------------------------- Atrial septum: No defect or patent foramen ovale was identified.  ------------------------------------------------------------------- Right ventricle: The cavity size was normal. Wall thickness was normal. Systolic function was normal.  ------------------------------------------------------------------- Pulmonic valve:  Structurally normal valve.  Cusp separation was normal. Doppler: Transvalvular velocity was within the normal range. There was mild regurgitation.  ------------------------------------------------------------------- Tricuspid valve:  Structurally normal valve.  Leaflet separation was normal. Doppler: Transvalvular velocity was within the normal range. There was mild regurgitation.  ------------------------------------------------------------------- Right atrium: The atrium was normal in size.  ------------------------------------------------------------------- Pericardium: The pericardium was normal in appearance.  ------------------------------------------------------------------- Measurements  Left ventricle              Value     Reference LV ID, ED, PLAX chordal          47.5 mm    43 - 52 LV ID, ES, PLAX chordal          26.5 mm    23 - 38 LV  fx shortening, PLAX  chordal      44  %    >=29 LV PW thickness, ED            10.5 mm    --------- IVS/LV PW ratio, ED            0.9      <=1.3 Stroke volume, 2D             73  ml    --------- Stroke volume/bsa, 2D           40  ml/m^2  --------- LV e&', lateral              14  cm/s   --------- LV E/e&', lateral             8.86      --------- LV e&', medial               7.6  cm/s   --------- LV E/e&', medial              16.32     --------- LV e&', average              10.8 cm/s   --------- LV E/e&', average             11.48     ---------  Ventricular septum            Value     Reference IVS thickness, ED             9.45 mm    ---------  LVOT                   Value     Reference LVOT ID, S                20  mm    --------- LVOT area                 3.14 cm^2   --------- LVOT ID                  20  mm    --------- LVOT peak velocity, S           97  cm/s   --------- LVOT mean velocity, S           63  cm/s   --------- LVOT VTI, S                23.3 cm    --------- LVOT peak gradient, S           4   mm Hg  --------- Stroke volume (SV), LVOT DP        73.2 ml    --------- Stroke index (SV/bsa), LVOT DP      40.2 ml/m^2  ---------  Aortic valve               Value     Reference Aortic valve mean velocity, S       144  cm/s   --------- Aortic valve VTI, S            51.6 cm    --------- Aortic mean gradient, S          10  mm Hg  --------- Aortic peak gradient, S          18  mm Hg  --------- VTI ratio, LVOT/AV  0.45      --------- Aortic valve area, VTI          1.42 cm^2   --------- Aortic valve area/bsa, VTI        0.78 cm^2/m^2 --------- Aortic valve area, peak velocity     1.42 cm^2   --------- Aortic valve area/bsa, peak        0.78 cm^2/m^2 --------- velocity Velocity ratio, mean, LVOT/AV       0.44      --------- Aortic valve area, mean velocity     1.37 cm^2   --------- Aortic valve area/bsa, mean        0.75 cm^2/m^2 --------- velocity  Aorta                   Value     Reference Aortic root ID, ED            39  mm    --------- Ascending aorta ID, A-P, S        39  mm    ---------  Left atrium                Value     Reference LA ID, A-P, ES              41  mm    --------- LA ID/bsa, A-P          (H)   2.25 cm/m^2  <=2.2 LA volume, S               58  ml    --------- LA volume/bsa, S             31.8 ml/m^2  --------- LA volume, ES, 1-p A4C          51  ml    --------- LA volume/bsa, ES, 1-p A4C        28  ml/m^2  --------- LA volume, ES, 1-p A2C          60  ml    --------- LA volume/bsa, ES, 1-p A2C        32.9 ml/m^2  ---------  Mitral valve               Value     Reference Mitral E-wave peak velocity        124  cm/s   --------- Mitral A-wave peak velocity        65.2 cm/s   --------- Mitral deceleration time         197  ms    150 - 230 Mitral peak gradient, D          6   mm Hg  --------- Mitral E/A ratio, peak          1.9      ---------  Pulmonary arteries            Value     Reference PA pressure, S, DP            28  mm Hg  <=30  Tricuspid valve               Value     Reference Tricuspid regurg peak velocity      252  cm/s   --------- Tricuspid peak RV-RA gradient       25  mm Hg  ---------  Systemic veins              Value     Reference Estimated  CVP               3   mm Hg  ---------  Right ventricle              Value     Reference RV pressure, S, DP            28  mm Hg  <=30 RV s&', lateral, S             12.8 cm/s   ---------  Legend: (L) and (H) mark values outside specified reference range.  ------------------------------------------------------------------- Prepared and Electronically Authenticated by  Jenkins Rouge, M.D. 2015-12-01T09:59:12   Impression:  Patient is doing well approximately 4 months status post aortic valve replacement using a bioprosthetic tissue valve. She has been maintaining sinus rhythm off of amiodarone. She still complains of some intermittent dizzy spells, although overall she reports feeling better. She has exertional shortness of breath only with relatively strenuous exertion, such as going up a flight of stairs or walking at a very brisk pace. Follow-up echocardiogram looks good with normal functioning bioprosthetic tissue valve in the aortic position and preserved left ventricular systolic function.    Plan:  I have encouraged the patient not worry so much about her resting pulse or blood pressure and rather to continue to gradually increase her physical activity as tolerated. We have not recommended any changes to her current medications. She has been reminded regarding the need for antibiotic prophylaxis for all dental cleaning and related procedures. She apparently needs a root canal performed. I think this would be reasonable at any time.  I spent in excess of 15 minutes during the conduct of this office consultation and >50% of this time involved direct  face-to-face encounter with the patient for counseling and/or coordination of their care.   Valentina Gu. Roxy Manns, MD 10/02/2014 2:58 PM

## 2014-10-04 ENCOUNTER — Encounter (HOSPITAL_COMMUNITY)
Admission: RE | Admit: 2014-10-04 | Discharge: 2014-10-04 | Disposition: A | Discharge status: Home / Self Care | Payer: Medicare Other | Source: Ambulatory Visit | Attending: Cardiology | Admitting: Cardiology

## 2014-10-04 DIAGNOSIS — I350 Nonrheumatic aortic (valve) stenosis: Secondary | ICD-10-CM | POA: Diagnosis not present

## 2014-10-04 DIAGNOSIS — I5032 Chronic diastolic (congestive) heart failure: Secondary | ICD-10-CM | POA: Diagnosis not present

## 2014-10-04 DIAGNOSIS — I480 Paroxysmal atrial fibrillation: Secondary | ICD-10-CM | POA: Diagnosis not present

## 2014-10-04 DIAGNOSIS — Z5189 Encounter for other specified aftercare: Secondary | ICD-10-CM | POA: Diagnosis not present

## 2014-10-04 DIAGNOSIS — F419 Anxiety disorder, unspecified: Secondary | ICD-10-CM | POA: Diagnosis not present

## 2014-10-04 DIAGNOSIS — Z952 Presence of prosthetic heart valve: Secondary | ICD-10-CM | POA: Diagnosis not present

## 2014-10-06 ENCOUNTER — Encounter (HOSPITAL_COMMUNITY)
Admission: RE | Admit: 2014-10-06 | Discharge: 2014-10-06 | Disposition: A | Discharge status: Home / Self Care | Payer: Medicare Other | Source: Ambulatory Visit | Attending: Cardiology | Admitting: Cardiology

## 2014-10-06 DIAGNOSIS — I480 Paroxysmal atrial fibrillation: Secondary | ICD-10-CM | POA: Diagnosis not present

## 2014-10-06 DIAGNOSIS — I5032 Chronic diastolic (congestive) heart failure: Secondary | ICD-10-CM | POA: Diagnosis not present

## 2014-10-06 DIAGNOSIS — Z952 Presence of prosthetic heart valve: Secondary | ICD-10-CM | POA: Diagnosis not present

## 2014-10-06 DIAGNOSIS — F419 Anxiety disorder, unspecified: Secondary | ICD-10-CM | POA: Diagnosis not present

## 2014-10-06 DIAGNOSIS — I350 Nonrheumatic aortic (valve) stenosis: Secondary | ICD-10-CM | POA: Diagnosis not present

## 2014-10-06 DIAGNOSIS — Z5189 Encounter for other specified aftercare: Secondary | ICD-10-CM | POA: Diagnosis not present

## 2014-10-09 ENCOUNTER — Encounter (HOSPITAL_COMMUNITY)
Admission: RE | Admit: 2014-10-09 | Discharge: 2014-10-09 | Disposition: A | Discharge status: Home / Self Care | Payer: Medicare Other | Source: Ambulatory Visit | Attending: Cardiology | Admitting: Cardiology

## 2014-10-09 DIAGNOSIS — I480 Paroxysmal atrial fibrillation: Secondary | ICD-10-CM | POA: Diagnosis not present

## 2014-10-09 DIAGNOSIS — Z5189 Encounter for other specified aftercare: Secondary | ICD-10-CM | POA: Diagnosis not present

## 2014-10-09 DIAGNOSIS — I350 Nonrheumatic aortic (valve) stenosis: Secondary | ICD-10-CM | POA: Diagnosis not present

## 2014-10-09 DIAGNOSIS — Z952 Presence of prosthetic heart valve: Secondary | ICD-10-CM | POA: Diagnosis not present

## 2014-10-09 DIAGNOSIS — F419 Anxiety disorder, unspecified: Secondary | ICD-10-CM | POA: Diagnosis not present

## 2014-10-09 DIAGNOSIS — I5032 Chronic diastolic (congestive) heart failure: Secondary | ICD-10-CM | POA: Diagnosis not present

## 2014-10-11 ENCOUNTER — Encounter (HOSPITAL_COMMUNITY)
Admission: RE | Admit: 2014-10-11 | Discharge: 2014-10-11 | Disposition: A | Discharge status: Home / Self Care | Payer: Medicare Other | Source: Ambulatory Visit | Attending: Cardiology | Admitting: Cardiology

## 2014-10-11 DIAGNOSIS — Z952 Presence of prosthetic heart valve: Secondary | ICD-10-CM | POA: Diagnosis not present

## 2014-10-11 DIAGNOSIS — I350 Nonrheumatic aortic (valve) stenosis: Secondary | ICD-10-CM | POA: Diagnosis not present

## 2014-10-11 DIAGNOSIS — I5032 Chronic diastolic (congestive) heart failure: Secondary | ICD-10-CM | POA: Diagnosis not present

## 2014-10-11 DIAGNOSIS — I480 Paroxysmal atrial fibrillation: Secondary | ICD-10-CM | POA: Diagnosis not present

## 2014-10-11 DIAGNOSIS — I951 Orthostatic hypotension: Secondary | ICD-10-CM | POA: Diagnosis not present

## 2014-10-11 DIAGNOSIS — E785 Hyperlipidemia, unspecified: Secondary | ICD-10-CM | POA: Diagnosis not present

## 2014-10-11 DIAGNOSIS — F419 Anxiety disorder, unspecified: Secondary | ICD-10-CM | POA: Diagnosis not present

## 2014-10-11 DIAGNOSIS — E039 Hypothyroidism, unspecified: Secondary | ICD-10-CM | POA: Diagnosis not present

## 2014-10-11 DIAGNOSIS — Z5189 Encounter for other specified aftercare: Secondary | ICD-10-CM | POA: Diagnosis not present

## 2014-10-13 ENCOUNTER — Encounter (HOSPITAL_COMMUNITY)
Admission: RE | Admit: 2014-10-13 | Discharge: 2014-10-13 | Disposition: A | Discharge status: Home / Self Care | Payer: Medicare Other | Source: Ambulatory Visit | Attending: Cardiology | Admitting: Cardiology

## 2014-10-13 DIAGNOSIS — Z5189 Encounter for other specified aftercare: Secondary | ICD-10-CM | POA: Diagnosis not present

## 2014-10-13 DIAGNOSIS — I480 Paroxysmal atrial fibrillation: Secondary | ICD-10-CM | POA: Diagnosis not present

## 2014-10-13 DIAGNOSIS — I5032 Chronic diastolic (congestive) heart failure: Secondary | ICD-10-CM | POA: Diagnosis not present

## 2014-10-13 DIAGNOSIS — Z952 Presence of prosthetic heart valve: Secondary | ICD-10-CM | POA: Diagnosis not present

## 2014-10-13 DIAGNOSIS — I350 Nonrheumatic aortic (valve) stenosis: Secondary | ICD-10-CM | POA: Diagnosis not present

## 2014-10-13 DIAGNOSIS — F419 Anxiety disorder, unspecified: Secondary | ICD-10-CM | POA: Diagnosis not present

## 2014-10-16 ENCOUNTER — Telehealth: Payer: Self-pay | Admitting: Cardiology

## 2014-10-16 ENCOUNTER — Encounter (HOSPITAL_COMMUNITY)
Admission: RE | Admit: 2014-10-16 | Discharge: 2014-10-16 | Disposition: A | Discharge status: Home / Self Care | Payer: Medicare Other | Source: Ambulatory Visit | Attending: Cardiology | Admitting: Cardiology

## 2014-10-16 DIAGNOSIS — I480 Paroxysmal atrial fibrillation: Secondary | ICD-10-CM | POA: Diagnosis not present

## 2014-10-16 DIAGNOSIS — Z5189 Encounter for other specified aftercare: Secondary | ICD-10-CM | POA: Diagnosis not present

## 2014-10-16 DIAGNOSIS — Z952 Presence of prosthetic heart valve: Secondary | ICD-10-CM | POA: Diagnosis not present

## 2014-10-16 DIAGNOSIS — F419 Anxiety disorder, unspecified: Secondary | ICD-10-CM | POA: Diagnosis not present

## 2014-10-16 DIAGNOSIS — I5032 Chronic diastolic (congestive) heart failure: Secondary | ICD-10-CM | POA: Diagnosis not present

## 2014-10-16 DIAGNOSIS — I350 Nonrheumatic aortic (valve) stenosis: Secondary | ICD-10-CM | POA: Diagnosis not present

## 2014-10-16 NOTE — Telephone Encounter (Signed)
New message      Pt is allergic to clindamycin.  She has a presc for azithromycin.  Is this the same as clindamycin?  She needs dental work done.  Please call before 9 or after 12:00

## 2014-10-16 NOTE — Telephone Encounter (Signed)
Pt advised.

## 2014-10-16 NOTE — Telephone Encounter (Signed)
Reviewed with Jiles Prows not be cross sensitivity/reactivity with clindamycin and azithromycin, Should be Ok to take azithromycin.  LMTCB for pt.

## 2014-10-18 ENCOUNTER — Ambulatory Visit (INDEPENDENT_AMBULATORY_CARE_PROVIDER_SITE_OTHER): Payer: Medicare Other | Admitting: *Deleted

## 2014-10-18 ENCOUNTER — Ambulatory Visit (INDEPENDENT_AMBULATORY_CARE_PROVIDER_SITE_OTHER): Payer: Medicare Other | Admitting: Cardiology

## 2014-10-18 ENCOUNTER — Encounter: Payer: Self-pay | Admitting: Cardiology

## 2014-10-18 ENCOUNTER — Encounter (HOSPITAL_COMMUNITY)
Admission: RE | Admit: 2014-10-18 | Discharge: 2014-10-18 | Disposition: A | Discharge status: Home / Self Care | Payer: Medicare Other | Source: Ambulatory Visit | Attending: Cardiology | Admitting: Cardiology

## 2014-10-18 VITALS — BP 124/64 | HR 64 | Ht 64.5 in | Wt 163.0 lb

## 2014-10-18 DIAGNOSIS — Z954 Presence of other heart-valve replacement: Secondary | ICD-10-CM

## 2014-10-18 DIAGNOSIS — I4891 Unspecified atrial fibrillation: Secondary | ICD-10-CM

## 2014-10-18 DIAGNOSIS — I9789 Other postprocedural complications and disorders of the circulatory system, not elsewhere classified: Secondary | ICD-10-CM | POA: Diagnosis not present

## 2014-10-18 DIAGNOSIS — Z953 Presence of xenogenic heart valve: Secondary | ICD-10-CM

## 2014-10-18 DIAGNOSIS — Z5181 Encounter for therapeutic drug level monitoring: Secondary | ICD-10-CM

## 2014-10-18 DIAGNOSIS — Z952 Presence of prosthetic heart valve: Secondary | ICD-10-CM | POA: Diagnosis not present

## 2014-10-18 DIAGNOSIS — I359 Nonrheumatic aortic valve disorder, unspecified: Secondary | ICD-10-CM | POA: Diagnosis not present

## 2014-10-18 DIAGNOSIS — Z5189 Encounter for other specified aftercare: Secondary | ICD-10-CM | POA: Diagnosis not present

## 2014-10-18 DIAGNOSIS — F419 Anxiety disorder, unspecified: Secondary | ICD-10-CM | POA: Diagnosis not present

## 2014-10-18 DIAGNOSIS — Z1389 Encounter for screening for other disorder: Secondary | ICD-10-CM | POA: Diagnosis not present

## 2014-10-18 DIAGNOSIS — I951 Orthostatic hypotension: Secondary | ICD-10-CM | POA: Diagnosis not present

## 2014-10-18 DIAGNOSIS — Z6827 Body mass index (BMI) 27.0-27.9, adult: Secondary | ICD-10-CM | POA: Diagnosis not present

## 2014-10-18 DIAGNOSIS — Q231 Congenital insufficiency of aortic valve: Secondary | ICD-10-CM

## 2014-10-18 DIAGNOSIS — I350 Nonrheumatic aortic (valve) stenosis: Secondary | ICD-10-CM | POA: Diagnosis not present

## 2014-10-18 DIAGNOSIS — K222 Esophageal obstruction: Secondary | ICD-10-CM | POA: Diagnosis not present

## 2014-10-18 DIAGNOSIS — E039 Hypothyroidism, unspecified: Secondary | ICD-10-CM | POA: Diagnosis not present

## 2014-10-18 DIAGNOSIS — K529 Noninfective gastroenteritis and colitis, unspecified: Secondary | ICD-10-CM | POA: Diagnosis not present

## 2014-10-18 DIAGNOSIS — I480 Paroxysmal atrial fibrillation: Secondary | ICD-10-CM | POA: Diagnosis not present

## 2014-10-18 DIAGNOSIS — Z008 Encounter for other general examination: Secondary | ICD-10-CM | POA: Diagnosis not present

## 2014-10-18 DIAGNOSIS — D126 Benign neoplasm of colon, unspecified: Secondary | ICD-10-CM | POA: Diagnosis not present

## 2014-10-18 DIAGNOSIS — I5032 Chronic diastolic (congestive) heart failure: Secondary | ICD-10-CM | POA: Diagnosis not present

## 2014-10-18 DIAGNOSIS — E785 Hyperlipidemia, unspecified: Secondary | ICD-10-CM | POA: Diagnosis not present

## 2014-10-18 LAB — POCT INR: INR: 2.2

## 2014-10-18 NOTE — Patient Instructions (Signed)
Your physician recommends that you schedule a follow-up appointment in: 3 months with Dr McLean  

## 2014-10-18 NOTE — Progress Notes (Signed)
Patient ID: Becky Winters, female   DOB: 05/07/1941, 74 y.o.   MRN: [id number] PCP: Dr. Forde Dandy  74 yo with history of bicuspid aortic valve now s/p bioprosthetic AVR with post-op atrial fibrillation, hyperlipidemia, and h/o prior TIA presents for cardiology followup.  She developed severe AS and had bioprosthetic AVR in 10/15.  Post-op, she had paroxysmal atrial fibrillation.  This was recurrent and ultimately she was started on amiodarone and warfarin.  She had multiple side effects with amiodarone so this was stopped.  She has also had problems with orthostatic symptoms post-op.   Currently doing pretty well.  She going to cardiac rehab.  No tachypalpitations.  She is in NSR today.  She is short of breath walking up stairs or hills.  She does not have any problems walking on flat ground.  Occasional dizziness with standing.  She is now off BP-active meds.  She is wearing compression stockings.    Labs (8/15): K 5, creatinine 0.9, cortisol normal, LDL 58, HDL 38, TSH/free T4 normal, LFTs normal Labs (9/15): BNP 53 Labs (2/16): K 4.7, creatinine 0.8, LDL 64, HDL 39  ECG: NSR, normal  PMH: 1. Collagenous colitis 2. TIA 3. Hypothyroidism 4. Hyperlipidemia 5. CAD: Cardiolites in 2003 and 2006 normal. LHC (10/15) with 40% proximal LCx stenosis.  6. Esophageal stricture. 7. Bicuspid aortic valve: Diagnosed on 4/12 echo with mild AS.  Repeat echo (9/15) with EF 60-65%, bicuspid aortic valve with severe AS (mean gradient 37 mmHg, peak gradient 68 mmHg, AVA 0.6-0.7 cm^2).  MRA chest (9/15) with 3.8 cm ascending aorta.  She had bioprosthetic aortic valve replacement in 10/15.  Echo postop showed EF 55%, mild LVH, normal-functioning bioprosthetic aortic valve, mild MR. 8. Cholecystectomy 9. TAH 10. Appendectomy 11. Atrial fibrillation: Paroxysmal.  Patient did not tolerate amiodarone.  12. Orthostatic hypotension.   SH: Married, 2 children, nonsmoker, lives in [place].   FH: Father with CAD  diagnosed at 10, grandmother with CVA.   ROS: All systems reviewed and negative except as per HPI.   Current Outpatient Prescriptions  Medication Sig Dispense Refill  . ALPRAZolam (XANAX) 0.25 MG tablet Take 0.25 mg by mouth daily as needed for sleep.     Marland Kitchen atorvastatin (LIPITOR) 40 MG tablet Take 20 mg by mouth at bedtime.     Marland Kitchen azithromycin (ZITHROMAX) 250 MG tablet Take 2 tablets 30-60 minutes before dental procedure 2 each 2  . b complex vitamins tablet Take 1 tablet by mouth daily.     . beta carotene w/minerals (OCUVITE) tablet Take 1 tablet by mouth 2 (two) times daily.     . cholecalciferol (VITAMIN D) 1000 UNITS tablet Take 1,000 Units by mouth 2 (two) times daily.     . Cinnamon 500 MG TABS Take 1 tablet by mouth 2 (two) times daily.     . Coenzyme Q10 (COQ-10) 100 MG CAPS Take 1 tablet by mouth 2 (two) times daily.    . DiphenhydrAMINE HCl, Sleep, 50 MG CAPS Take 1-2 capsules by mouth at bedtime as needed.    . folic acid (FOLVITE) 409 MCG tablet Take 800 mcg by mouth daily.     Marland Kitchen levothyroxine (SYNTHROID, LEVOTHROID) 125 MCG tablet Take 125 mcg by mouth daily. Patient takes every day    . Melatonin 3 MG TABS Take 1 tablet by mouth at bedtime.     . metoprolol tartrate (LOPRESSOR) 25 MG tablet Take 0.5 tablets (12.5 mg total) by mouth as needed (Take 12.5 mg for palpitations;  may repeat another 12.5 mg 1 hour later if needed). 30 tablet 3  . Multiple Vitamins-Minerals (HAIR/SKIN/NAILS PO) Take 1 tablet by mouth daily. Hair, skin and Nails Vitamin    . Omega-3 Fatty Acids (FISH OIL PO) Take 1,400 mg by mouth 2 (two) times daily.    Marland Kitchen warfarin (COUMADIN) 2.5 MG tablet Take 2.5 mg po daily or as directed by [facility] 30 tablet 3   No current facility-administered medications for this visit.    BP 124/64 mmHg  Pulse 64  Ht 5' 4.5" (1.638 m)  Wt 163 lb (73.936 kg)  BMI 27.56 kg/m2 General: NAD Neck: No JVD, no thyromegaly or thyroid nodule.  Lungs: Clear to auscultation  bilaterally with normal respiratory effort. CV: Nondisplaced PMI.  Heart regular S1/S2, no S3/S4, 1/6 SEM RUSB.  No edema.  No carotid bruit.  Normal pedal pulses.  Abdomen: Soft, nontender, no hepatosplenomegaly, no distention.  Skin: Intact without lesions or rashes.  Neurologic: Alert and oriented x 3.  Psych: Normal affect. Extremities: No clubbing or cyanosis.   Assessment/Plan: 1. Aortic stenosis: Bicuspid aortic valve disorder now s/p bioprosthetic aortic valve replacement.  Valve was stable on post-op echo in 12/15.  She has an ectatic ascending aorta (3.8 cm) but it is not large enough that it would need repair.  She will need SBE prophylaxis with dental work.  Continue cardiac rehab.  2. Hyperlipidemia: Good lipids on atorvastatin.   3. CAD: Nonobstructive on pre-op coronary angiography.   4. Atrial fibrillation: She remains in NSR.  She did not tolerate amiodarone.  Continue warfarin.  If she has recurrent atrial fibrillation, will need to consider Tikoysn.   5. Orthostatic symptoms: She is off all BP-active medications.  She will continue compression stocking use.   Loralie Champagne 10/18/2014

## 2014-10-20 ENCOUNTER — Encounter (HOSPITAL_COMMUNITY)
Admission: RE | Admit: 2014-10-20 | Discharge: 2014-10-20 | Disposition: A | Discharge status: Home / Self Care | Payer: Medicare Other | Source: Ambulatory Visit | Attending: Cardiology | Admitting: Cardiology

## 2014-10-20 DIAGNOSIS — Z952 Presence of prosthetic heart valve: Secondary | ICD-10-CM | POA: Diagnosis not present

## 2014-10-20 DIAGNOSIS — F419 Anxiety disorder, unspecified: Secondary | ICD-10-CM | POA: Diagnosis not present

## 2014-10-20 DIAGNOSIS — I5032 Chronic diastolic (congestive) heart failure: Secondary | ICD-10-CM | POA: Diagnosis not present

## 2014-10-20 DIAGNOSIS — I480 Paroxysmal atrial fibrillation: Secondary | ICD-10-CM | POA: Diagnosis not present

## 2014-10-20 DIAGNOSIS — Z5189 Encounter for other specified aftercare: Secondary | ICD-10-CM | POA: Diagnosis not present

## 2014-10-20 DIAGNOSIS — I350 Nonrheumatic aortic (valve) stenosis: Secondary | ICD-10-CM | POA: Diagnosis not present

## 2014-10-23 ENCOUNTER — Encounter (HOSPITAL_COMMUNITY)
Admission: RE | Admit: 2014-10-23 | Discharge: 2014-10-23 | Disposition: A | Discharge status: Home / Self Care | Payer: Medicare Other | Source: Ambulatory Visit | Attending: Cardiology | Admitting: Cardiology

## 2014-10-23 DIAGNOSIS — I350 Nonrheumatic aortic (valve) stenosis: Secondary | ICD-10-CM | POA: Diagnosis not present

## 2014-10-23 DIAGNOSIS — Z952 Presence of prosthetic heart valve: Secondary | ICD-10-CM | POA: Diagnosis not present

## 2014-10-23 DIAGNOSIS — F419 Anxiety disorder, unspecified: Secondary | ICD-10-CM | POA: Diagnosis not present

## 2014-10-23 DIAGNOSIS — Z5189 Encounter for other specified aftercare: Secondary | ICD-10-CM | POA: Diagnosis not present

## 2014-10-23 DIAGNOSIS — I5032 Chronic diastolic (congestive) heart failure: Secondary | ICD-10-CM | POA: Diagnosis not present

## 2014-10-23 DIAGNOSIS — I480 Paroxysmal atrial fibrillation: Secondary | ICD-10-CM | POA: Diagnosis not present

## 2014-10-23 NOTE — Progress Notes (Signed)
Becky Winters 74 y.o. female Nutrition Note Spoke with pt.  Nutrition Plan and Nutrition Survey goals reviewed with pt. Pt is following Step 2 of the Therapeutic Lifestyle Changes diet according to MEDFICTS score. However, pt does not eat fruit/veggies due to colitis/diarrhea. Pt states she has not recently been watching her sodium intake because of hypotension. Per pt, "the doctor told me to eat salted pretzels."  Pt is pre-diabetic according to pt's last A1c Pt reports she was aware of pre-diabetes dx. Pt is taking Coumadin and is aware of the need to follow a diet consistent in vitamin k intake. Pt expressed understanding of the information reviewed. Pt aware of nutrition education classes offered.  Nutrition Diagnosis ? Food-and nutrition-related knowledge deficit related to lack of exposure to information as related to diagnosis of: ? CVD ? Pre-DM (A1c 5.8)  ?  Nutrition RX/ Estimated Daily Nutrition Needs for: wt maintenance 1800-2000 Kcal, 55-65 gm fat, 11-13 gm sat fat, 1.7-2.0 gm trans-fat, <1500 mg sodium  Nutrition Intervention ? Pt's individual nutrition plan reviewed with pt. ? Benefits of adopting Therapeutic Lifestyle Changes discussed when Medficts reviewed. ? Pt to attend the Portion Distortion class - met; 09/27/14 ? Pt to attend the  ? Nutrition I class - met; 07/25/14                         ? Nutrition II class - met; 09/19/14 ? Continue client-centered nutrition education by RD, as part of interdisciplinary care. Goal(s) ? Pt to describe the potential benefits of adopting Therapeutic Lifestyle Changes Monitor and Evaluate progress toward nutrition goal with team. Nutrition Risk: Change to Moderate Derek Mound, M.Ed, RD, LDN, CDE 10/23/2014 11:31 AM

## 2014-10-23 NOTE — Progress Notes (Signed)
[employer] today. Becky Winters says her goals have been met to increase her energy and strength. Becky Winters plans to walk with her husband. PHQ=0.

## 2014-10-24 ENCOUNTER — Telehealth: Payer: Self-pay | Admitting: Cardiology

## 2014-10-24 ENCOUNTER — Encounter: Payer: Self-pay | Admitting: Cardiology

## 2014-10-24 NOTE — Telephone Encounter (Signed)
Amoxicillin 2 g prior to procedure.  Would not stop any meds unless dentist specifically asks to stop one.

## 2014-10-24 NOTE — Telephone Encounter (Signed)
Notified pt that Dr. Aundra Dubin not in office today and will forward message to him and his nurse Eliot Ford for recommendations.

## 2014-10-24 NOTE — Telephone Encounter (Signed)
New message     1. What dental office are you calling from? Dr Ignatius Specking  2. What is your office phone and fax number? [phone]  3. What type of procedure is the patient having performed? Tooth extraction   4. What date is procedure scheduled? 10-30-14  5. What is your question (ex. Antibiotics prior to procedure, holding medication-we need to know how long dentist wants pt to hold med)? Does pt have permission to have tooth removed and how many days to stay off coumadin?

## 2014-10-25 ENCOUNTER — Encounter (HOSPITAL_COMMUNITY): Payer: Medicare Other

## 2014-10-25 NOTE — Telephone Encounter (Signed)
Follow Up        Pt returning Becky Winters's phone call.

## 2014-10-25 NOTE — Telephone Encounter (Signed)
Spoke with patient about Dr Claris Gladden recommendations for hold warfarin.  Pt states she will have taken azithromycin X 3 prior to dental procedures/appts  for prophylaxis in the last 10-14 days.  Pt states since she  has taken more than a 1 time dose of antibiotics  in the last several days she would like to know if she should have INR.  Pt advised I will forward to [facility] to follow up with her.

## 2014-10-25 NOTE — Telephone Encounter (Signed)
B[place]ly at Dr Tina Griffiths' office aware of Dr Claris Gladden recommendations for holding warfarin.   LMTCB for pt.

## 2014-10-25 NOTE — Telephone Encounter (Signed)
I spoke with Becky Winters at Dr Tina Griffiths office. She states Dr Tina Griffiths is requesting pt hold warfarin for 3 days prior to extraction.   Becky Winters advised I will forward to Dr Aundra Dubin for review and recommendations.

## 2014-10-25 NOTE — Telephone Encounter (Signed)
Spoke with pt.  Explained a few days of azithromycin would be fine with her Coumadin.  She will actually be off Coumadin for the extraction during the time she is taking the abx.  Made an appt for 10 days after the extraction to recheck INR.

## 2014-10-25 NOTE — Telephone Encounter (Signed)
May hold then resume.

## 2014-10-27 ENCOUNTER — Encounter (HOSPITAL_COMMUNITY): Payer: Medicare Other

## 2014-10-30 ENCOUNTER — Encounter (HOSPITAL_COMMUNITY): Payer: Medicare Other

## 2014-11-01 ENCOUNTER — Encounter (HOSPITAL_COMMUNITY): Payer: Medicare Other

## 2014-11-03 ENCOUNTER — Encounter (HOSPITAL_COMMUNITY): Payer: Medicare Other

## 2014-11-06 ENCOUNTER — Encounter (HOSPITAL_COMMUNITY): Payer: Medicare Other

## 2014-11-08 ENCOUNTER — Encounter (HOSPITAL_COMMUNITY): Payer: Medicare Other

## 2014-11-09 ENCOUNTER — Ambulatory Visit (INDEPENDENT_AMBULATORY_CARE_PROVIDER_SITE_OTHER): Payer: Medicare Other | Admitting: *Deleted

## 2014-11-09 DIAGNOSIS — I9789 Other postprocedural complications and disorders of the circulatory system, not elsewhere classified: Secondary | ICD-10-CM

## 2014-11-09 DIAGNOSIS — I4891 Unspecified atrial fibrillation: Secondary | ICD-10-CM | POA: Diagnosis not present

## 2014-11-09 DIAGNOSIS — Z954 Presence of other heart-valve replacement: Secondary | ICD-10-CM | POA: Diagnosis not present

## 2014-11-09 DIAGNOSIS — Z5181 Encounter for therapeutic drug level monitoring: Secondary | ICD-10-CM

## 2014-11-09 DIAGNOSIS — Q231 Congenital insufficiency of aortic valve: Secondary | ICD-10-CM

## 2014-11-09 DIAGNOSIS — Z953 Presence of xenogenic heart valve: Secondary | ICD-10-CM

## 2014-11-09 LAB — POCT INR: INR: 1.8

## 2014-11-10 ENCOUNTER — Encounter (HOSPITAL_COMMUNITY): Payer: Medicare Other

## 2014-11-13 ENCOUNTER — Encounter (HOSPITAL_COMMUNITY): Payer: Medicare Other

## 2014-11-15 ENCOUNTER — Encounter (HOSPITAL_COMMUNITY): Payer: Medicare Other

## 2014-11-17 ENCOUNTER — Encounter (HOSPITAL_COMMUNITY): Payer: Medicare Other

## 2014-11-30 ENCOUNTER — Ambulatory Visit (INDEPENDENT_AMBULATORY_CARE_PROVIDER_SITE_OTHER): Payer: Medicare Other | Admitting: *Deleted

## 2014-11-30 DIAGNOSIS — Z954 Presence of other heart-valve replacement: Secondary | ICD-10-CM | POA: Diagnosis not present

## 2014-11-30 DIAGNOSIS — Q231 Congenital insufficiency of aortic valve: Secondary | ICD-10-CM

## 2014-11-30 DIAGNOSIS — Z5181 Encounter for therapeutic drug level monitoring: Secondary | ICD-10-CM | POA: Diagnosis not present

## 2014-11-30 DIAGNOSIS — I9789 Other postprocedural complications and disorders of the circulatory system, not elsewhere classified: Secondary | ICD-10-CM | POA: Diagnosis not present

## 2014-11-30 DIAGNOSIS — I4891 Unspecified atrial fibrillation: Secondary | ICD-10-CM

## 2014-11-30 DIAGNOSIS — Z953 Presence of xenogenic heart valve: Secondary | ICD-10-CM

## 2014-11-30 LAB — POCT INR: INR: 2.2

## 2015-01-11 ENCOUNTER — Ambulatory Visit (INDEPENDENT_AMBULATORY_CARE_PROVIDER_SITE_OTHER): Payer: Medicare Other | Admitting: *Deleted

## 2015-01-11 DIAGNOSIS — Q231 Congenital insufficiency of aortic valve: Secondary | ICD-10-CM | POA: Diagnosis not present

## 2015-01-11 DIAGNOSIS — Z5181 Encounter for therapeutic drug level monitoring: Secondary | ICD-10-CM | POA: Diagnosis not present

## 2015-01-11 DIAGNOSIS — Z954 Presence of other heart-valve replacement: Secondary | ICD-10-CM | POA: Diagnosis not present

## 2015-01-11 DIAGNOSIS — Z953 Presence of xenogenic heart valve: Secondary | ICD-10-CM

## 2015-01-11 DIAGNOSIS — I4891 Unspecified atrial fibrillation: Secondary | ICD-10-CM

## 2015-01-11 DIAGNOSIS — I9789 Other postprocedural complications and disorders of the circulatory system, not elsewhere classified: Secondary | ICD-10-CM

## 2015-01-11 LAB — POCT INR: INR: 2.3

## 2015-01-30 ENCOUNTER — Encounter: Payer: Self-pay | Admitting: Cardiology

## 2015-02-12 ENCOUNTER — Ambulatory Visit: Payer: BC Managed Care – PPO | Admitting: Cardiology

## 2015-02-21 ENCOUNTER — Ambulatory Visit (INDEPENDENT_AMBULATORY_CARE_PROVIDER_SITE_OTHER): Payer: Medicare Other | Admitting: *Deleted

## 2015-02-21 ENCOUNTER — Other Ambulatory Visit: Payer: Self-pay | Admitting: *Deleted

## 2015-02-21 ENCOUNTER — Ambulatory Visit (INDEPENDENT_AMBULATORY_CARE_PROVIDER_SITE_OTHER): Payer: Medicare Other | Admitting: Cardiology

## 2015-02-21 VITALS — BP 118/58 | HR 63 | Ht 64.5 in | Wt 163.0 lb

## 2015-02-21 DIAGNOSIS — Z5181 Encounter for therapeutic drug level monitoring: Secondary | ICD-10-CM

## 2015-02-21 DIAGNOSIS — I4891 Unspecified atrial fibrillation: Secondary | ICD-10-CM

## 2015-02-21 DIAGNOSIS — I9789 Other postprocedural complications and disorders of the circulatory system, not elsewhere classified: Secondary | ICD-10-CM

## 2015-02-21 DIAGNOSIS — I480 Paroxysmal atrial fibrillation: Secondary | ICD-10-CM | POA: Diagnosis not present

## 2015-02-21 DIAGNOSIS — Z953 Presence of xenogenic heart valve: Secondary | ICD-10-CM

## 2015-02-21 DIAGNOSIS — Z954 Presence of other heart-valve replacement: Secondary | ICD-10-CM | POA: Diagnosis not present

## 2015-02-21 DIAGNOSIS — Q231 Congenital insufficiency of aortic valve: Secondary | ICD-10-CM

## 2015-02-21 LAB — CBC WITH DIFFERENTIAL/PLATELET
BASOS PCT: 0.3 % (ref 0.0–3.0)
Basophils Absolute: 0 10*3/uL (ref 0.0–0.1)
EOS ABS: 0.1 10*3/uL (ref 0.0–0.7)
EOS PCT: 1.2 % (ref 0.0–5.0)
HEMATOCRIT: 40.2 % (ref 36.0–46.0)
HEMOGLOBIN: 13.4 g/dL (ref 12.0–15.0)
LYMPHS ABS: 2.9 10*3/uL (ref 0.7–4.0)
Lymphocytes Relative: 32.5 % (ref 12.0–46.0)
MCHC: 33.3 g/dL (ref 30.0–36.0)
MCV: 81.1 fl (ref 78.0–100.0)
MONO ABS: 0.9 10*3/uL (ref 0.1–1.0)
Monocytes Relative: 10 % (ref 3.0–12.0)
NEUTROS ABS: 5 10*3/uL (ref 1.4–7.7)
Neutrophils Relative %: 56 % (ref 43.0–77.0)
Platelets: 261 10*3/uL (ref 150.0–400.0)
RBC: 4.96 Mil/uL (ref 3.87–5.11)
RDW: 16.9 % — ABNORMAL HIGH (ref 11.5–15.5)
WBC: 8.9 10*3/uL (ref 4.0–10.5)

## 2015-02-21 LAB — POCT INR: INR: 2.2

## 2015-02-21 MED ORDER — WARFARIN SODIUM 2.5 MG PO TABS: ORAL_TABLET | ORAL | Status: DC

## 2015-02-21 NOTE — Patient Instructions (Signed)
Medication Instructions:  No changes today  Labwork: CBCd today  Testing/Procedures: None today  Follow-Up: Your physician wants you to follow-up in: 6 months with Dr Aundra Dubin. (December 2016).  You will receive a reminder letter in the mail two months in advance. If you don't receive a letter, please call our office to schedule the follow-up appointment.

## 2015-02-22 ENCOUNTER — Encounter: Payer: Self-pay | Admitting: Cardiology

## 2015-02-22 NOTE — Progress Notes (Signed)
Patient ID: Becky Winters, female   DOB: May 07, 1941, 74 y.o.   MRN: [id number] PCP: Dr. Forde Dandy  74 yo with history of bicuspid aortic valve now s/p bioprosthetic AVR with post-op atrial fibrillation, hyperlipidemia, and h/o prior TIA presents for cardiology followup.  She developed severe AS and had bioprosthetic AVR in 10/15.  Post-op, she had paroxysmal atrial fibrillation.  This was recurrent and ultimately she was started on amiodarone and warfarin.  She had multiple side effects with amiodarone so this was stopped.  She also had problems with orthostatic symptoms post-op, this resolved.   Currently doing well.  No tachypalpitations.  She is in NSR today.  Main complaint is arthritis.  She has mild dyspnea walking up stairs or a hill.  However, overall, she feels significantly improved symptomatically post-surgery.  No chest pain.  No lightheadedness.   Labs (8/15): K 5, creatinine 0.9, cortisol normal, LDL 58, HDL 38, TSH/free T4 normal, LFTs normal Labs (9/15): BNP 53 Labs (2/16): K 4.7, creatinine 0.8, LDL 64, HDL 39  PMH: 1. Collagenous colitis 2. TIA 3. Hypothyroidism 4. Hyperlipidemia 5. CAD: Cardiolites in 2003 and 2006 normal. LHC (10/15) with 40% proximal LCx stenosis.  6. Esophageal stricture. 7. Bicuspid aortic valve: Diagnosed on 4/12 echo with mild AS.  Repeat echo (9/15) with EF 60-65%, bicuspid aortic valve with severe AS (mean gradient 37 mmHg, peak gradient 68 mmHg, AVA 0.6-0.7 cm^2).  MRA chest (9/15) with 3.8 cm ascending aorta.  She had bioprosthetic aortic valve replacement in 10/15.  Echo postop showed EF 55%, mild LVH, normal-functioning bioprosthetic aortic valve, mild MR. 8. Cholecystectomy 9. TAH 10. Appendectomy 11. Atrial fibrillation: Paroxysmal.  Patient did not tolerate amiodarone.  12. Orthostatic hypotension.   SH: Married, 2 children, nonsmoker, lives in [place].   FH: Father with CAD diagnosed at 64, grandmother with CVA.   ROS: All systems  reviewed and negative except as per HPI.   Current Outpatient Prescriptions  Medication Sig Dispense Refill  . ALPRAZolam (XANAX) 0.25 MG tablet Take 0.25 mg by mouth daily as needed for sleep.     Marland Kitchen atorvastatin (LIPITOR) 40 MG tablet Take 20 mg by mouth at bedtime.     Marland Kitchen azithromycin (ZITHROMAX) 250 MG tablet Take 2 tablets 30-60 minutes before dental procedure 2 each 2  . b complex vitamins tablet Take 1 tablet by mouth daily.     . beta carotene w/minerals (OCUVITE) tablet Take 1 tablet by mouth 2 (two) times daily.     . cholecalciferol (VITAMIN D) 1000 UNITS tablet Take 1,000 Units by mouth 2 (two) times daily.     . Cinnamon 500 MG TABS Take 1 tablet by mouth 2 (two) times daily.     . Coenzyme Q10 (COQ-10) 100 MG CAPS Take 1 tablet by mouth 2 (two) times daily.    . DiphenhydrAMINE HCl, Sleep, 50 MG CAPS Take 1-2 capsules by mouth at bedtime as needed.    . folic acid (FOLVITE) 292 MCG tablet Take 800 mcg by mouth daily.     Marland Kitchen levothyroxine (SYNTHROID, LEVOTHROID) 125 MCG tablet Take 125 mcg by mouth daily. Patient takes every day    . Melatonin 3 MG TABS Take 1 tablet by mouth at bedtime.     . metoprolol tartrate (LOPRESSOR) 25 MG tablet Take 0.5 tablets (12.5 mg total) by mouth as needed (Take 12.5 mg for palpitations; may repeat another 12.5 mg 1 hour later if needed). 30 tablet 3  . Multiple Vitamins-Minerals (HAIR/SKIN/NAILS PO)  Take 1 tablet by mouth daily. Hair, skin and Nails Vitamin    . Omega-3 Fatty Acids (FISH OIL PO) Take 1,400 mg by mouth 2 (two) times daily.    Marland Kitchen warfarin (COUMADIN) 2.5 MG tablet Take 2.5 mg po daily or as directed by [facility] 90 tablet 0   No current facility-administered medications for this visit.    BP 118/58 mmHg  Pulse 63  Ht 5' 4.5" (1.638 m)  Wt 163 lb (73.936 kg)  BMI 27.56 kg/m2 General: NAD Neck: No JVD, no thyromegaly or thyroid nodule.  Lungs: Clear to auscultation bilaterally with normal respiratory effort. CV: Nondisplaced  PMI.  Heart regular S1/S2, no S3/S4, 1/6 SEM RUSB.  No edema.  No carotid bruit.  Normal pedal pulses.  Abdomen: Soft, nontender, no hepatosplenomegaly, no distention.  Skin: Intact without lesions or rashes.  Neurologic: Alert and oriented x 3.  Psych: Normal affect. Extremities: No clubbing or cyanosis.   Assessment/Plan: 1. Aortic stenosis: Bicuspid aortic valve disorder now s/p bioprosthetic aortic valve replacement.  Valve was stable on post-op echo in 12/15.  She has an ectatic ascending aorta (3.8 cm) but it is not large enough that it would need repair.  She will need SBE prophylaxis with dental work.  Continue cardiac rehab.  2. Hyperlipidemia: Good lipids on atorvastatin.   3. CAD: Nonobstructive on pre-op coronary angiography.   4. Atrial fibrillation: Paroxysmal.  She remains in NSR.  She did not tolerate amiodarone.  Continue warfarin.   5. Orthostatic hypotension: This has resolved.   Loralie Champagne 02/22/2015

## 2015-02-22 NOTE — Progress Notes (Deleted)
   Subjective:    Patient ID: Becky Winters, female    DOB: Jul 09, 1941, 74 y.o.   MRN: [id number]  HPI    Review of Systems     Objective:   Physical Exam        Assessment & Plan:

## 2015-04-03 ENCOUNTER — Ambulatory Visit (INDEPENDENT_AMBULATORY_CARE_PROVIDER_SITE_OTHER): Payer: Medicare Other

## 2015-04-03 DIAGNOSIS — Q231 Congenital insufficiency of aortic valve: Secondary | ICD-10-CM

## 2015-04-03 DIAGNOSIS — Z5181 Encounter for therapeutic drug level monitoring: Secondary | ICD-10-CM | POA: Diagnosis not present

## 2015-04-03 DIAGNOSIS — I9789 Other postprocedural complications and disorders of the circulatory system, not elsewhere classified: Secondary | ICD-10-CM

## 2015-04-03 DIAGNOSIS — Z954 Presence of other heart-valve replacement: Secondary | ICD-10-CM | POA: Diagnosis not present

## 2015-04-03 DIAGNOSIS — Z953 Presence of xenogenic heart valve: Secondary | ICD-10-CM

## 2015-04-03 DIAGNOSIS — I4891 Unspecified atrial fibrillation: Secondary | ICD-10-CM

## 2015-04-03 LAB — POCT INR: INR: 1.8

## 2015-04-11 DIAGNOSIS — E785 Hyperlipidemia, unspecified: Secondary | ICD-10-CM | POA: Diagnosis not present

## 2015-04-11 DIAGNOSIS — E039 Hypothyroidism, unspecified: Secondary | ICD-10-CM | POA: Diagnosis not present

## 2015-04-11 DIAGNOSIS — N390 Urinary tract infection, site not specified: Secondary | ICD-10-CM | POA: Diagnosis not present

## 2015-04-11 DIAGNOSIS — Z952 Presence of prosthetic heart valve: Secondary | ICD-10-CM | POA: Diagnosis not present

## 2015-04-11 DIAGNOSIS — I2510 Atherosclerotic heart disease of native coronary artery without angina pectoris: Secondary | ICD-10-CM | POA: Diagnosis not present

## 2015-04-11 DIAGNOSIS — Z6827 Body mass index (BMI) 27.0-27.9, adult: Secondary | ICD-10-CM | POA: Diagnosis not present

## 2015-04-11 DIAGNOSIS — K222 Esophageal obstruction: Secondary | ICD-10-CM | POA: Diagnosis not present

## 2015-04-11 DIAGNOSIS — K219 Gastro-esophageal reflux disease without esophagitis: Secondary | ICD-10-CM | POA: Diagnosis not present

## 2015-04-11 DIAGNOSIS — K529 Noninfective gastroenteritis and colitis, unspecified: Secondary | ICD-10-CM | POA: Diagnosis not present

## 2015-04-16 DIAGNOSIS — H2513 Age-related nuclear cataract, bilateral: Secondary | ICD-10-CM | POA: Diagnosis not present

## 2015-04-16 DIAGNOSIS — H52203 Unspecified astigmatism, bilateral: Secondary | ICD-10-CM | POA: Diagnosis not present

## 2015-05-01 ENCOUNTER — Ambulatory Visit (INDEPENDENT_AMBULATORY_CARE_PROVIDER_SITE_OTHER): Payer: Medicare Other

## 2015-05-01 DIAGNOSIS — I9789 Other postprocedural complications and disorders of the circulatory system, not elsewhere classified: Secondary | ICD-10-CM | POA: Diagnosis not present

## 2015-05-01 DIAGNOSIS — I4891 Unspecified atrial fibrillation: Secondary | ICD-10-CM

## 2015-05-01 DIAGNOSIS — Z5181 Encounter for therapeutic drug level monitoring: Secondary | ICD-10-CM

## 2015-05-01 DIAGNOSIS — Q231 Congenital insufficiency of aortic valve: Secondary | ICD-10-CM | POA: Diagnosis not present

## 2015-05-01 DIAGNOSIS — Z954 Presence of other heart-valve replacement: Secondary | ICD-10-CM | POA: Diagnosis not present

## 2015-05-01 DIAGNOSIS — Z953 Presence of xenogenic heart valve: Secondary | ICD-10-CM

## 2015-05-01 LAB — POCT INR: INR: 1.9

## 2015-05-29 ENCOUNTER — Ambulatory Visit (INDEPENDENT_AMBULATORY_CARE_PROVIDER_SITE_OTHER): Payer: Medicare Other | Admitting: *Deleted

## 2015-05-29 DIAGNOSIS — Z5181 Encounter for therapeutic drug level monitoring: Secondary | ICD-10-CM

## 2015-05-29 DIAGNOSIS — I9789 Other postprocedural complications and disorders of the circulatory system, not elsewhere classified: Secondary | ICD-10-CM

## 2015-05-29 DIAGNOSIS — I4891 Unspecified atrial fibrillation: Secondary | ICD-10-CM

## 2015-05-29 DIAGNOSIS — Q231 Congenital insufficiency of aortic valve: Secondary | ICD-10-CM | POA: Diagnosis not present

## 2015-05-29 DIAGNOSIS — Z954 Presence of other heart-valve replacement: Secondary | ICD-10-CM

## 2015-05-29 DIAGNOSIS — Z953 Presence of xenogenic heart valve: Secondary | ICD-10-CM

## 2015-05-29 LAB — POCT INR: INR: 1.5

## 2015-06-02 DIAGNOSIS — Z23 Encounter for immunization: Secondary | ICD-10-CM | POA: Diagnosis not present

## 2015-06-04 ENCOUNTER — Encounter: Payer: Self-pay | Admitting: Thoracic Surgery (Cardiothoracic Vascular Surgery)

## 2015-06-04 ENCOUNTER — Encounter: Payer: Self-pay | Admitting: *Deleted

## 2015-06-04 ENCOUNTER — Ambulatory Visit (INDEPENDENT_AMBULATORY_CARE_PROVIDER_SITE_OTHER): Payer: Medicare Other | Admitting: Thoracic Surgery (Cardiothoracic Vascular Surgery)

## 2015-06-04 VITALS — BP 130/75 | HR 65 | Resp 16 | Ht 64.5 in | Wt 163.0 lb

## 2015-06-04 DIAGNOSIS — I350 Nonrheumatic aortic (valve) stenosis: Secondary | ICD-10-CM | POA: Diagnosis not present

## 2015-06-04 DIAGNOSIS — Q231 Congenital insufficiency of aortic valve: Secondary | ICD-10-CM

## 2015-06-04 DIAGNOSIS — Z954 Presence of other heart-valve replacement: Secondary | ICD-10-CM

## 2015-06-04 DIAGNOSIS — Z953 Presence of xenogenic heart valve: Secondary | ICD-10-CM

## 2015-06-04 NOTE — Progress Notes (Signed)
[address]       [place] [address]             [phone]     CARDIOTHORACIC SURGERY OFFICE NOTE  Referring Provider is Larey Dresser, MD PCP is Sheela Stack, MD   HPI:  Patient returns for routine follow-up approximately one year status post aortic valve replacement using a bioprosthetic tissue valve on 06/07/2014. Her early postoperative recovery was notable for the development of atrial fibrillation.  She was last seen here in our office on 10/02/2014 at which time she was doing well and maintaining sinus rhythm.  Since then she has continued to do very well. She has been followed regularly by Dr. Aundra Dubin who saw her most recently on 02/21/2015. She returns to our office for routine follow-up today. The patient states that overall she feels much improved in comparison with how she was doing prior to surgery. She has not had any further problems or difficulties whatsoever. She states that she still gets short of breath with more strenuous physical activity, but for the most part this does not limit her daily activities whatsoever. Her exercise tolerance is much better than it was prior to surgery. She has never had any chest pain or chest tightness with activity. She has not had any palpitations or other symptoms to suggest a recurrence of atrial fibrillation. She remains anticoagulated using warfarin. She has not had any bleeding complications or other problems related to Coumadin therapy.   Current Outpatient Prescriptions  Medication Sig Dispense Refill  . ALPRAZolam (XANAX) 0.25 MG tablet Take 0.25 mg by mouth daily as needed for sleep.     Marland Kitchen atorvastatin (LIPITOR) 40 MG tablet Take 20 mg by mouth at bedtime.     Marland Kitchen azithromycin (ZITHROMAX) 250 MG tablet Take 2 tablets 30-60 minutes before dental procedure 2 each 2  . b complex vitamins tablet Take 1 tablet by mouth daily.     . beta carotene w/minerals (OCUVITE) tablet Take 1 tablet by mouth 2 (two)  times daily.     . Biotin 5000 MCG TABS Take 1 tablet by mouth daily.    . cholecalciferol (VITAMIN D) 1000 UNITS tablet Take 1,000 Units by mouth 2 (two) times daily.     . Cinnamon 500 MG TABS Take 1 tablet by mouth 2 (two) times daily.     . DiphenhydrAMINE HCl, Sleep, 50 MG CAPS Take 1 capsule by mouth at bedtime as needed.     . folic acid (FOLVITE) 341 MCG tablet Take 800 mcg by mouth daily.     Marland Kitchen levothyroxine (SYNTHROID, LEVOTHROID) 125 MCG tablet Take 125 mcg by mouth daily. 1/2 ON SUNDAY    . Melatonin 3 MG TABS Take 1 tablet by mouth at bedtime.     . metoprolol tartrate (LOPRESSOR) 25 MG tablet Take 0.5 tablets (12.5 mg total) by mouth as needed (Take 12.5 mg for palpitations; may repeat another 12.5 mg 1 hour later if needed). 30 tablet 3  . nitrofurantoin (MACRODANTIN) 100 MG capsule Take 100 mg by mouth 2 (two) times daily as needed.    Jonna Coup Leaf Extract 500 MG CAPS Take 2 capsules by mouth daily.    . Omega-3 Fatty Acids (FISH OIL PO) Take 1,400 mg by mouth 2 (two) times daily.    . Coenzyme Q10 (COQ-10) 100 MG CAPS Take 1 tablet by mouth 2 (two) times daily.    Marland Kitchen warfarin (COUMADIN) 2.5 MG tablet Take  2.5 mg po daily or as directed by [facility] 90 tablet 0   No current facility-administered medications for this visit.      Physical Exam:   BP 130/75 mmHg  Pulse 65  Resp 16  Ht 5' 4.5" (1.638 m)  Wt 163 lb (73.936 kg)  BMI 27.56 kg/m2  SpO2 98%  General:  Well-appearing  Chest:   clear  CV:   Regular rate and rhythm with soft systolic murmur  Incisions:  Completely healed, sternum is stable  Abdomen:  Soft and nontender  Extremities:  Warm and well-perfused  Diagnostic Tests:  Transthoracic Echocardiography  Patient:  Kamron, Portee MR #:    [id number] Study Date: 07/25/2014 Gender:   F Age:    74 Height:   165.1 cm Weight:   71.2 kg BSA:    1.82 m^2 Pt. Status: Room:  ATTENDING  Darlin Coco SONOGRAPHER Zachary,  Will Faylene Million, Scott T REFERRING  [place], Bern T REFERRING  Loralie Champagne, M.D. PERFORMING  [facility], Outpatient  cc:  ------------------------------------------------------------------- LV EF: 55%  ------------------------------------------------------------------- Indications:   (Z95.4).  ------------------------------------------------------------------- History:  PMH: S/p AVR. Acquired from the patient and from the patient&'s chart. Atrial fibrillation. Risk factors: Dyslipidemia.  ------------------------------------------------------------------- Study Conclusions  - Left ventricle: Abnormal septal motion. The cavity size was normal. Wall thickness was increased in a pattern of mild LVH. The estimated ejection fraction was 55%. - Aortic valve: Normal appearing bioprosthetic tissue valve with normal systolic gradients and no peri-valvular regurgitation. - Mitral valve: There was mild regurgitation. - Left atrium: The atrium was mildly dilated. - Atrial septum: No defect or patent foramen ovale was identified.  Transthoracic echocardiography. M-mode, complete 2D, spectral Doppler, and color Doppler. Birthdate: Patient birthdate: [birth year]. Age: Patient is 74 yr old. Sex: Gender: female. BMI: 26.1 kg/m^2. Blood pressure:   138/72 Patient status: Outpatient. Study date: Study date: 07/25/2014. Study time: 08:46 AM. Location: [facility]  -------------------------------------------------------------------  ------------------------------------------------------------------- Left ventricle: Abnormal septal motion. The cavity size was normal. Wall thickness was increased in a pattern of mild LVH. The estimated ejection fraction was 55%.  ------------------------------------------------------------------- Aortic valve: Normal appearing bioprosthetic tissue valve with normal systolic gradients and no peri-valvular  regurgitation. Doppler:   VTI ratio of LVOT to aortic valve: 0.45. Valve area (VTI): 1.42 cm^2. Indexed valve area (VTI): 0.78 cm^2/m^2. Valve area (Vmax): 1.42 cm^2. Indexed valve area (Vmax): 0.78 cm^2/m^2. Mean velocity ratio of LVOT to aortic valve: 0.44. Valve area (Vmean): 1.37 cm^2. Indexed valve area (Vmean): 0.75 cm^2/m^2. Mean gradient (S): 10 mm Hg. Peak gradient (S): 18 mm Hg.  ------------------------------------------------------------------- Mitral valve:  Mildly thickened leaflets . Doppler: There was mild regurgitation.  Peak gradient (D): 6 mm Hg.  ------------------------------------------------------------------- Left atrium: The atrium was mildly dilated.  ------------------------------------------------------------------- Atrial septum: No defect or patent foramen ovale was identified.  ------------------------------------------------------------------- Right ventricle: The cavity size was normal. Wall thickness was normal. Systolic function was normal.  ------------------------------------------------------------------- Pulmonic valve:  Structurally normal valve.  Cusp separation was normal. Doppler: Transvalvular velocity was within the normal range. There was mild regurgitation.  ------------------------------------------------------------------- Tricuspid valve:  Structurally normal valve.  Leaflet separation was normal. Doppler: Transvalvular velocity was within the normal range. There was mild regurgitation.  ------------------------------------------------------------------- Right atrium: The atrium was normal in size.  ------------------------------------------------------------------- Pericardium: The pericardium was normal in appearance.  ------------------------------------------------------------------- Measurements  Left ventricle              Value  Reference LV ID, ED, PLAX chordal           47.5 mm    43 - 52 LV ID, ES, PLAX chordal          26.5 mm    23 - 38 LV fx shortening, PLAX chordal      44  %    >=29 LV PW thickness, ED            10.5 mm    --------- IVS/LV PW ratio, ED            0.9      <=1.3 Stroke volume, 2D             73  ml    --------- Stroke volume/bsa, 2D           40  ml/m^2  --------- LV e&', lateral              14  cm/s   --------- LV E/e&', lateral             8.86      --------- LV e&', medial               7.6  cm/s   --------- LV E/e&', medial              16.32     --------- LV e&', average              10.8 cm/s   --------- LV E/e&', average             11.48     ---------  Ventricular septum            Value     Reference IVS thickness, ED             9.45 mm    ---------  LVOT                   Value     Reference LVOT ID, S                20  mm    --------- LVOT area                 3.14 cm^2   --------- LVOT ID                  20  mm    --------- LVOT peak velocity, S           97  cm/s   --------- LVOT mean velocity, S           63  cm/s   --------- LVOT VTI, S                23.3 cm    --------- LVOT peak gradient, S           4   mm Hg  --------- Stroke volume (SV), LVOT DP        73.2 ml    --------- Stroke index (SV/bsa), LVOT DP      40.2 ml/m^2  ---------  Aortic valve               Value     Reference Aortic valve mean velocity, S       144  cm/s   --------- Aortic valve VTI, S            51.6 cm    --------- Aortic mean gradient, S  10  mm  Hg  --------- Aortic peak gradient, S          18  mm Hg  --------- VTI ratio, LVOT/AV            0.45      --------- Aortic valve area, VTI          1.42 cm^2   --------- Aortic valve area/bsa, VTI        0.78 cm^2/m^2 --------- Aortic valve area, peak velocity     1.42 cm^2   --------- Aortic valve area/bsa, peak        0.78 cm^2/m^2 --------- velocity Velocity ratio, mean, LVOT/AV       0.44      --------- Aortic valve area, mean velocity     1.37 cm^2   --------- Aortic valve area/bsa, mean        0.75 cm^2/m^2 --------- velocity  Aorta                   Value     Reference Aortic root ID, ED            39  mm    --------- Ascending aorta ID, A-P, S        39  mm    ---------  Left atrium                Value     Reference LA ID, A-P, ES              41  mm    --------- LA ID/bsa, A-P          (H)   2.25 cm/m^2  <=2.2 LA volume, S               58  ml    --------- LA volume/bsa, S             31.8 ml/m^2  --------- LA volume, ES, 1-p A4C          51  ml    --------- LA volume/bsa, ES, 1-p A4C        28  ml/m^2  --------- LA volume, ES, 1-p A2C          60  ml    --------- LA volume/bsa, ES, 1-p A2C        32.9 ml/m^2  ---------  Mitral valve               Value     Reference Mitral E-wave peak velocity        124  cm/s   --------- Mitral A-wave peak velocity        65.2 cm/s   --------- Mitral deceleration time         197  ms    150 - 230 Mitral peak gradient, D          6   mm Hg  --------- Mitral E/A ratio, peak          1.9      ---------  Pulmonary arteries             Value     Reference PA pressure, S, DP            28  mm Hg  <=30  Tricuspid valve              Value     Reference Tricuspid regurg peak velocity      252  cm/s   ---------  Tricuspid peak RV-RA gradient       25  mm Hg  ---------  Systemic veins              Value     Reference Estimated CVP               3   mm Hg  ---------  Right ventricle              Value     Reference RV pressure, S, DP            28  mm Hg  <=30 RV s&', lateral, S             12.8 cm/s   ---------  Legend: (L) and (H) mark values outside specified reference range.  ------------------------------------------------------------------- Prepared and Electronically Authenticated by  Jenkins Rouge, M.D. 2015-12-01T09:59:12   Impression:  Patient is doing very well approximately one year following aortic valve replacement using a bioprosthetic tissue valve. Follow-up echocardiogram performed last December looked good with normal functioning bioprosthetic tissue valve in the aortic position and mean transvalvular gradient estimated 10 mmHg.  Left ventricular systolic function appears normal with ejection fraction estimated 55%. The patient describes stable symptoms of exertional shortness of breath and fatigue consistent with chronic diastolic congestive heart failure, New York Heart Association functional class 1-2  Plan:  The patient has been reminded regarding the need for antibiotic prophylaxis for all dental cleaning and related procedures.  In the future she will call and return to see [place] as needed.  I spent in excess of 15 minutes during the conduct of this office consultation and >50% of this time involved direct face-to-face encounter with the patient for counseling and/or coordination of their care.   Valentina Gu. Roxy Manns, MD 06/04/2015 10:35  AM

## 2015-06-04 NOTE — Patient Instructions (Signed)

## 2015-06-06 DIAGNOSIS — M19032 Primary osteoarthritis, left wrist: Secondary | ICD-10-CM | POA: Diagnosis not present

## 2015-06-06 DIAGNOSIS — M25532 Pain in left wrist: Secondary | ICD-10-CM | POA: Diagnosis not present

## 2015-06-12 ENCOUNTER — Ambulatory Visit (INDEPENDENT_AMBULATORY_CARE_PROVIDER_SITE_OTHER): Payer: Medicare Other

## 2015-06-12 DIAGNOSIS — Z954 Presence of other heart-valve replacement: Secondary | ICD-10-CM | POA: Diagnosis not present

## 2015-06-12 DIAGNOSIS — I9789 Other postprocedural complications and disorders of the circulatory system, not elsewhere classified: Secondary | ICD-10-CM | POA: Diagnosis not present

## 2015-06-12 DIAGNOSIS — Z953 Presence of xenogenic heart valve: Secondary | ICD-10-CM

## 2015-06-12 DIAGNOSIS — Q231 Congenital insufficiency of aortic valve: Secondary | ICD-10-CM | POA: Diagnosis not present

## 2015-06-12 DIAGNOSIS — I4891 Unspecified atrial fibrillation: Secondary | ICD-10-CM

## 2015-06-12 DIAGNOSIS — Z5181 Encounter for therapeutic drug level monitoring: Secondary | ICD-10-CM

## 2015-06-12 LAB — POCT INR: INR: 2.2

## 2015-06-21 DIAGNOSIS — M542 Cervicalgia: Secondary | ICD-10-CM | POA: Diagnosis not present

## 2015-06-21 DIAGNOSIS — M50822 Other cervical disc disorders at C5-C6 level: Secondary | ICD-10-CM | POA: Diagnosis not present

## 2015-06-25 ENCOUNTER — Other Ambulatory Visit: Payer: Self-pay | Admitting: Cardiology

## 2015-07-03 ENCOUNTER — Ambulatory Visit (INDEPENDENT_AMBULATORY_CARE_PROVIDER_SITE_OTHER): Payer: Medicare Other | Admitting: *Deleted

## 2015-07-03 DIAGNOSIS — Z954 Presence of other heart-valve replacement: Secondary | ICD-10-CM | POA: Diagnosis not present

## 2015-07-03 DIAGNOSIS — I4891 Unspecified atrial fibrillation: Secondary | ICD-10-CM

## 2015-07-03 DIAGNOSIS — Z5181 Encounter for therapeutic drug level monitoring: Secondary | ICD-10-CM | POA: Diagnosis not present

## 2015-07-03 DIAGNOSIS — Z953 Presence of xenogenic heart valve: Secondary | ICD-10-CM

## 2015-07-03 DIAGNOSIS — I9789 Other postprocedural complications and disorders of the circulatory system, not elsewhere classified: Secondary | ICD-10-CM

## 2015-07-03 DIAGNOSIS — Q231 Congenital insufficiency of aortic valve: Secondary | ICD-10-CM

## 2015-07-03 LAB — POCT INR: INR: 2.2

## 2015-07-10 DIAGNOSIS — M50822 Other cervical disc disorders at C5-C6 level: Secondary | ICD-10-CM | POA: Diagnosis not present

## 2015-07-10 DIAGNOSIS — M542 Cervicalgia: Secondary | ICD-10-CM | POA: Diagnosis not present

## 2015-07-24 DIAGNOSIS — M542 Cervicalgia: Secondary | ICD-10-CM | POA: Diagnosis not present

## 2015-07-26 DIAGNOSIS — M542 Cervicalgia: Secondary | ICD-10-CM | POA: Diagnosis not present

## 2015-07-27 ENCOUNTER — Telehealth: Payer: Self-pay | Admitting: Cardiology

## 2015-07-27 DIAGNOSIS — K222 Esophageal obstruction: Secondary | ICD-10-CM | POA: Insufficient documentation

## 2015-07-27 DIAGNOSIS — M1990 Unspecified osteoarthritis, unspecified site: Secondary | ICD-10-CM | POA: Insufficient documentation

## 2015-07-27 DIAGNOSIS — Z8719 Personal history of other diseases of the digestive system: Secondary | ICD-10-CM | POA: Insufficient documentation

## 2015-07-27 DIAGNOSIS — M7989 Other specified soft tissue disorders: Secondary | ICD-10-CM | POA: Insufficient documentation

## 2015-07-27 DIAGNOSIS — E785 Hyperlipidemia, unspecified: Secondary | ICD-10-CM | POA: Insufficient documentation

## 2015-07-27 DIAGNOSIS — K52831 Collagenous colitis: Secondary | ICD-10-CM | POA: Insufficient documentation

## 2015-07-27 DIAGNOSIS — F419 Anxiety disorder, unspecified: Secondary | ICD-10-CM | POA: Insufficient documentation

## 2015-07-27 DIAGNOSIS — N390 Urinary tract infection, site not specified: Secondary | ICD-10-CM | POA: Insufficient documentation

## 2015-07-27 DIAGNOSIS — E039 Hypothyroidism, unspecified: Secondary | ICD-10-CM | POA: Insufficient documentation

## 2015-07-27 DIAGNOSIS — K219 Gastro-esophageal reflux disease without esophagitis: Secondary | ICD-10-CM | POA: Insufficient documentation

## 2015-07-27 DIAGNOSIS — G459 Transient cerebral ischemic attack, unspecified: Secondary | ICD-10-CM | POA: Insufficient documentation

## 2015-07-27 NOTE — Telephone Encounter (Signed)
New Message   Patient c/o Palpitations:  High priority if patient c/o lightheadedness and shortness of breath.  1. How long have you been having palpitations? 12/1  2. Are you currently experiencing lightheadedness and shortness of breath? NO 3. Have you checked your BP and heart rate? (document readings) 131  4. Are you experiencing any other symptoms? NO

## 2015-07-27 NOTE — Telephone Encounter (Signed)
Called patient.  Patient stated that she has been having dizzy spells off and on for the last several months.  Says that they pass quickly. Denies cp or sob during these episodes.  Yesterday she had PT in the am for back pain.  After the therapy they put a "pain patch" on her back.  She then went outside and raked leaves.  She said she started having heart palpitations and went inside.  She had a b/p of 108/72 and HR of between 126 and 131.  She took a Lopressor 25mg  and then took another half an hour later, caused the palpitations to subside.  She said that she removed the "pain patch" and took a shower afterwards and was fine.  Says she was supposed to see Dr Aundra Dubin this month but couldn't get an appt until 09-14-15.  Said that she would like to be seen.

## 2015-07-27 NOTE — Telephone Encounter (Signed)
Gave info to flex, advised to schedule her with flex next week.  Called patient and scheduled on this Monday @2 :00.

## 2015-07-27 NOTE — Telephone Encounter (Signed)
May be atrial fibrillation again.  Agree with appt Monday.

## 2015-07-30 ENCOUNTER — Ambulatory Visit (INDEPENDENT_AMBULATORY_CARE_PROVIDER_SITE_OTHER): Payer: Medicare Other | Admitting: *Deleted

## 2015-07-30 ENCOUNTER — Telehealth: Payer: Self-pay | Admitting: Nurse Practitioner

## 2015-07-30 ENCOUNTER — Ambulatory Visit (INDEPENDENT_AMBULATORY_CARE_PROVIDER_SITE_OTHER): Payer: Medicare Other | Admitting: Nurse Practitioner

## 2015-07-30 ENCOUNTER — Encounter: Payer: Self-pay | Admitting: Nurse Practitioner

## 2015-07-30 ENCOUNTER — Other Ambulatory Visit: Payer: Self-pay | Admitting: Nurse Practitioner

## 2015-07-30 VITALS — BP 112/80 | HR 108 | Ht 64.5 in | Wt 165.1 lb

## 2015-07-30 DIAGNOSIS — Z954 Presence of other heart-valve replacement: Secondary | ICD-10-CM | POA: Diagnosis not present

## 2015-07-30 DIAGNOSIS — I9789 Other postprocedural complications and disorders of the circulatory system, not elsewhere classified: Secondary | ICD-10-CM | POA: Diagnosis not present

## 2015-07-30 DIAGNOSIS — I4891 Unspecified atrial fibrillation: Secondary | ICD-10-CM

## 2015-07-30 DIAGNOSIS — R5383 Other fatigue: Secondary | ICD-10-CM | POA: Diagnosis not present

## 2015-07-30 DIAGNOSIS — I4892 Unspecified atrial flutter: Secondary | ICD-10-CM

## 2015-07-30 DIAGNOSIS — Z953 Presence of xenogenic heart valve: Secondary | ICD-10-CM

## 2015-07-30 DIAGNOSIS — Z5181 Encounter for therapeutic drug level monitoring: Secondary | ICD-10-CM

## 2015-07-30 DIAGNOSIS — Q231 Congenital insufficiency of aortic valve: Secondary | ICD-10-CM | POA: Diagnosis not present

## 2015-07-30 LAB — BASIC METABOLIC PANEL
BUN: 20 mg/dL (ref 7–25)
CO2: 23 mmol/L (ref 20–31)
Calcium: 8.6 mg/dL (ref 8.6–10.4)
Chloride: 106 mmol/L (ref 98–110)
Creat: 1.01 mg/dL — ABNORMAL HIGH (ref 0.60–0.93)
Glucose, Bld: 92 mg/dL (ref 65–99)
Potassium: 4 mmol/L (ref 3.5–5.3)
Sodium: 140 mmol/L (ref 135–146)

## 2015-07-30 LAB — POCT INR: INR: 2.1

## 2015-07-30 LAB — CBC
HCT: 41.3 % (ref 36.0–46.0)
Hemoglobin: 13.9 g/dL (ref 12.0–15.0)
MCH: 27.6 pg (ref 26.0–34.0)
MCHC: 33.7 g/dL (ref 30.0–36.0)
MCV: 82.1 fL (ref 78.0–100.0)
MPV: 9.7 fL (ref 8.6–12.4)
Platelets: 297 10*3/uL (ref 150–400)
RBC: 5.03 MIL/uL (ref 3.87–5.11)
RDW: 14.9 % (ref 11.5–15.5)
WBC: 8.1 10*3/uL (ref 4.0–10.5)

## 2015-07-30 LAB — TSH: TSH: 0.2 u[IU]/mL — ABNORMAL LOW (ref 0.350–4.500)

## 2015-07-30 MED ORDER — METOPROLOL TARTRATE 25 MG PO TABS: 25.0000 mg | ORAL_TABLET | Freq: Two times a day (BID) | ORAL | Status: DC

## 2015-07-30 NOTE — Patient Instructions (Addendum)
We will be checking the following labs today - PT, PTT, BMET, CBC   Medication Instructions:    Continue with your current medicines. BUT  I am putting you on Metoprolol 25 mg to take twice a day every day - I have sent this to your drug store.     Testing/Procedures To Be Arranged:  Echocardiogram  Referrral to Dr. Rayann Heman for atrial fib/flutter consult ?ablation    Other Special Instructions:  Your provider has recommended a cardioversion.   You are scheduled for a cardioversion on Thursday, December 8th at 1PM  with Dr. Aundra Dubin or associates. Please go to [facility] [place] Stay at Thursday, December 8th at 11:30AM.  Enter through the [facility] not have any food or drink after midnight on Wedesday.  You may take your medicines with a sip of water on the day of your procedure.  You will need someone to drive you home following your procedure.   Call the [place] office at [phone] if you have any questions, problems or concerns.     Electrical Cardioversion Electrical cardioversion is the delivery of a jolt of electricity to change the rhythm of the heart. Sticky patches or metal paddles are placed on the chest to deliver the electricity from a device. This is done to restore a normal rhythm. A rhythm that is too fast or not regular keeps the heart from pumping well. Electrical cardioversion is done in an emergency if:  There is low or no blood pressure as a result of the heart rhythm.  Normal rhythm must be restored as fast as possible to protect the brain and heart from further damage.  It may save a life. Cardioversion may be done for heart rhythms that are not immediately life threatening, such as atrial fibrillation or flutter, in which:  The heart is beating too fast or is not regular.  Medicine to change the rhythm has not worked.  It is safe to wait in order to allow time for preparation. Symptoms of  the abnormal rhythm are bothersome. The risk of stroke and other serious problems can be reduced.  LET [facility] CARE PROVIDER KNOW ABOUT:  Any allergies you have. All medicines you are taking, including vitamins, herbs, eye drops, creams, and over-the-counter medicines. Previous problems you or members of your family have had with the use of anesthetics.  Any blood disorders you have.  Previous surgeries you have had.  Medical conditions you have.   RISKS AND COMPLICATIONS  Generally, this is a safe procedure. However, problems can occur and include:  Breathing problems related to the anesthetic used. A blood clot that breaks free and travels to other parts of your body. This could cause a stroke or other problems. The risk of this is lowered by use of blood-thinning medicine (anticoagulant) prior to the procedure. Cardiac arrest (rare).   BEFORE THE PROCEDURE  You may have tests to detect blood clots in your heart and to evaluate heart function. You may start taking anticoagulants so your blood does not clot as easily.  Medicines may be given to help stabilize your heart rate and rhythm.   PROCEDURE You will be given medicine through an IV tube to reduce discomfort and make you sleepy (sedative).  An electrical shock will be delivered.   AFTER THE PROCEDURE Your heart rhythm will be watched to make sure it does not change. You will need someone to drive you home.  If you need a refill on your cardiac medications before your next appointment, please call your pharmacy.   Call the [place] office at [phone] if you have any questions, problems or concerns.

## 2015-07-30 NOTE — Telephone Encounter (Signed)
Follow up   Pt requests a call back to discuss if it is ok for her to remain on coumadin for the cardioverion scheduled on 12/08. Pt req to have this message routed to NP Truitt Merle.

## 2015-07-30 NOTE — Progress Notes (Signed)
CARDIOLOGY OFFICE NOTE  Date:  07/30/2015    Becky Winters Date of Birth: 11-Nov-1940 Medical Record [id number]  PCP:  Sheela Stack, MD  Cardiologist:  Aundra Dubin    Chief Complaint  Patient presents with  . Palpitations    Work in visit - seen for Dr. Aundra Dubin  . Cardiac Valve Problem    History of Present Illness: Becky Winters is a 74 y.o. female who presents today for a work in visit. Seen for Dr. Aundra Dubin.   Last seen here in June of 2016 and felt to be doing well.   She has a history of bicuspid aortic valve now s/p bioprosthetic AVR with post-op atrial fibrillation, hyperlipidemia, and h/o prior TIA.  She developed severe AS and had bioprosthetic AVR in 10/15. Post-op, she had paroxysmal atrial fibrillation. This was recurrent and ultimately she was started on amiodarone and warfarin. She had multiple side effects with amiodarone so this was stopped. She also had problems with orthostatic symptoms post-op, this resolved.   Phone call today - "Called patient. Patient stated that she has been having dizzy spells off and on for the last several months. Says that they pass quickly. Denies cp or sob during these episodes. Yesterday she had PT in the am for back pain. After the therapy they put a "pain patch" on her back. She then went outside and raked leaves. She said she started having heart palpitations and went inside. She had a b/p of 108/72 and HR of between 126 and 131. She took a Lopressor 25mg  and then took another half an hour later, caused the palpitations to subside. She said that she removed the "pain patch" and took a shower afterwards and was fine. Says she was supposed to see Dr Aundra Dubin this month but couldn't get an appt until 09-14-15. Said that she would like to be seen".   Thus added to the FLEX.  Comes in today. Here with her husband. Getting PT for a pinched nerve in her neck. Was given an adhesive patch - by the PT person last Thursday.  Rarely takes her BP or pulse but started checking last Thursday - HR was up. She is pretty adamant about not going back on amiodarone. She says she is dizzy and is short of breath. Some of her symptoms are very similar to how she felt when she was getting her valve. Has had 2 spells of dizziness - walking "like a drunk" - since Thanksgiving. No real palpitations - does "not want to pay attention to it". She is frustrated.    PMH: 1. Collagenous colitis 2. TIA 3. Hypothyroidism 4. Hyperlipidemia 5. CAD: Cardiolites in 2003 and 2006 normal. LHC (10/15) with 40% proximal LCx stenosis.  6. Esophageal stricture. 7. Bicuspid aortic valve: Diagnosed on 4/12 echo with mild AS. Repeat echo (9/15) with EF 60-65%, bicuspid aortic valve with severe AS (mean gradient 37 mmHg, peak gradient 68 mmHg, AVA 0.6-0.7 cm^2). MRA chest (9/15) with 3.8 cm ascending aorta. She had bioprosthetic aortic valve replacement in 10/15. Echo postop showed EF 55%, mild LVH, normal-functioning bioprosthetic aortic valve, mild MR. 8. Cholecystectomy 9. TAH 10. Appendectomy 11. Atrial fibrillation: Paroxysmal. Patient did not tolerate amiodarone.  12. Orthostatic hypotension.    Past Medical History  Diagnosis Date  . Collagenous colitis   . TIA (transient ischemic attack)   . Hypothyroidism   . HLD (hyperlipidemia)   . Severe aortic stenosis 05/21/2014    s/p AVR 05/2014  . Orthostatic  hypotension 05/10/2014  . Chronic diastolic congestive heart failure ([place])   . Anxiety     conditional, taking in preparation for surgery   . GERD (gastroesophageal reflux disease)     prevacid on occasion  . Compression, esophagus 2013    stretched in the past   . Foot swelling     - LEFT-ever since she had an injury as a child   . H/O hiatal hernia   . UTI (urinary tract infection)     frequent UTI-----pt. reports that she takes Cipro if needed, last UTI- July 2015  . 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/07/2014    23 mm [facility] Ease bovine pericardial tissue valve  . Postoperative atrial fibrillation ([place]) 06/09/2014  . Hx of echocardiogram     post AVR >> Echo (12/15):  Mild LVH, EF 55%, AVR ok (Mean gradient (S): 10 mm Hg), mild MR, mild LAE  . Bicuspid aortic valve 05/10/2014  . Encounter for therapeutic drug monitoring 06/15/2014  . Exertional dyspnea 05/10/2014    Past Surgical History  Procedure Laterality Date  . Cardiolite  2003, 2006  . Esophageal dilation    . Appendectomy    . Cholecystectomy    . Total abdominal hysterectomy      PARTIAL  . Tonsillectomy    . Nasal septum surgery  1995  . Bladder surgery      x2 for tacking  . Thumb arthroscopy Left 2006    ? replacement of ligament   . Cardiac catheterization    . Aortic valve replacement N/A 06/07/2014    Procedure: AORTIC VALVE REPLACEMENT  (AVR) with 87mm Aortic Perimount Magna Ease;  Surgeon: Rexene Alberts, MD;  Location: [place];  Service: Open Heart Surgery;  Laterality: N/A;  . Intraoperative transesophageal echocardiogram N/A 06/07/2014    Procedure: INTRAOPERATIVE TRANSESOPHAGEAL ECHOCARDIOGRAM;  Surgeon: Rexene Alberts, MD;  Location: [place];  Service: Open Heart Surgery;  Laterality: N/A;  . Left and right heart catheterization with coronary angiogram N/A 05/25/2014    Procedure: LEFT AND RIGHT HEART CATHETERIZATION WITH CORONARY ANGIOGRAM;  Surgeon: Larey Dresser, MD;  Location: [facility] CATH LAB;  Service: Cardiovascular;  Laterality: N/A;     Medications: Current Outpatient Prescriptions  Medication Sig Dispense Refill  . ALPRAZolam (XANAX) 0.25 MG tablet Take 0.25 mg by mouth daily as needed for sleep.     Marland Kitchen atorvastatin (LIPITOR) 40 MG tablet Take 20 mg by mouth at bedtime.     Marland Kitchen azithromycin (ZITHROMAX) 250 MG tablet Take 2 tablets 30-60 minutes before dental procedure 2 each 2  . b complex vitamins  tablet Take 1 tablet by mouth daily.     . beta carotene w/minerals (OCUVITE) tablet Take 1 tablet by mouth daily.     . Biotin 5000 MCG TABS Take 1 tablet by mouth daily.    . cholecalciferol (VITAMIN D) 1000 UNITS tablet Take 1,000 Units by mouth 2 (two) times daily.     . Cinnamon 500 MG TABS Take 1 tablet by mouth 2 (two) times daily.     . Coenzyme Q10 (COQ-10) 100 MG CAPS Take 1 tablet by mouth 2 (two) times daily.    . DiphenhydrAMINE HCl, Sleep, 50 MG CAPS Take 1 capsule by mouth at bedtime.     . folic acid (FOLVITE) Q000111Q MCG tablet Take 800 mcg  by mouth daily.     Marland Kitchen levothyroxine (SYNTHROID, LEVOTHROID) 125 MCG tablet Take 125 mcg by mouth daily. 1/2 ON SUNDAY    . Melatonin 3 MG TABS Take 1 tablet by mouth at bedtime.     . nitrofurantoin (MACRODANTIN) 100 MG capsule Take 100 mg by mouth 2 (two) times daily as needed.    Jonna Coup Leaf Extract 500 MG CAPS Take 2 capsules by mouth daily.    . Omega-3 Fatty Acids (FISH OIL PO) Take 1,400 mg by mouth 2 (two) times daily.    Marland Kitchen warfarin (COUMADIN) 2.5 MG tablet TAKE 1 TABLET BY MOUTH DAILY OR AS DIRECTED BY COUMADIN CLINIC 90 tablet 0  . metoprolol tartrate (LOPRESSOR) 25 MG tablet Take 1 tablet (25 mg total) by mouth 2 (two) times daily. 60 tablet 3   No current facility-administered medications for this visit.    Allergies: Allergies  Allergen Reactions  . Amiodarone Other (See Comments)    Losing eye sight  . Clindamycin/Lincomycin Itching and Rash  . Asacol [Mesalamine] Other (See Comments)    Severe diarrhea   . Codeine Nausea And Vomiting  . Pentasa [Mesalamine Er] Other (See Comments)    Severe diarrhea   . Sulfa Antibiotics Rash  . Amoxicillin Rash    Social History: The patient  reports that she has never smoked. She does not have any smokeless tobacco history on file. She reports that she does not drink alcohol or use illicit drugs.   Family History: The patient's family history includes CAD (age of onset: 51) in  her father; CVA in an other family member; Heart attack in her father and paternal grandfather; Stroke in her paternal grandmother. There is no history of Hypertension.   Review of Systems: Please see the history of present illness.   Otherwise, the review of systems is positive for none.   All other systems are reviewed and negative.   Physical Exam: VS:  BP 112/80 mmHg  Pulse 108  Ht 5' 4.5" (1.638 m)  Wt 165 lb 1.9 oz (74.898 kg)  BMI 27.92 kg/m2 .  BMI Body mass index is 27.92 kg/(m^2).  Wt Readings from Last 3 Encounters:  07/30/15 165 lb 1.9 oz (74.898 kg)  06/04/15 163 lb (73.936 kg)  02/21/15 163 lb (73.936 kg)    General: Pleasant. Well developed, well nourished and in no acute distress.  HEENT: Normal. Neck: Supple, no JVD, carotid bruits, or masses noted.  Cardiac: Irregular irregular rate and rhythm. No murmurs, rubs, or gallops. No edema.  Respiratory:  Lungs are clear to auscultation bilaterally with normal work of breathing.  GI: Soft and nontender.  MS: No deformity or atrophy. Gait and ROM intact. Skin: Warm and dry. Color is normal.  Neuro:  Strength and sensation are intact and no gross focal deficits noted.  Psych: Alert, appropriate and with normal affect.   LABORATORY DATA:  EKG:  EKG is ordered today. This demonstrates atrial flutter with increased VR - HR is 109.  Lab Results  Component Value Date   WBC 8.9 02/21/2015   HGB 13.4 02/21/2015   HCT 40.2 02/21/2015   PLT 261.0 02/21/2015   GLUCOSE 134* 07/13/2014   ALT 18 08/10/2014   AST 18 08/10/2014   NA 141 07/13/2014   K 3.9 07/13/2014   CL 105 07/13/2014   CREATININE 1.00 07/13/2014   BUN 16 07/13/2014   CO2 25 06/11/2014   TSH 3.24 08/10/2014   INR 2.1 07/30/2015   HGBA1C  5.8* 06/05/2014    Lab Results  Component Value Date   INR 2.1 07/30/2015   INR 2.2 07/03/2015   INR 2.2 06/12/2015     BNP (last 3 results) No results for input(s): BNP in the last 8760 hours.  ProBNP (last  3 results) No results for input(s): PROBNP in the last 8760 hours.   Other Studies Reviewed Today:  Echo Study Conclusions from 07/2014  - Left ventricle: Abnormal septal motion. The cavity size was normal. Wall thickness was increased in a pattern of mild LVH. The estimated ejection fraction was 55%. - Aortic valve: Normal appearing bioprosthetic tissue valve with normal systolic gradients and no peri-valvular regurgitation. - Mitral valve: There was mild regurgitation. - Left atrium: The atrium was mildly dilated. - Atrial septum: No defect or patent foramen ovale was identified.   Assessment/Plan: 1. Aortic stenosis: Bicuspid aortic valve disorder now s/p bioprosthetic aortic valve replacement. Valve was stable on post-op echo in 12/15. She has an ectatic ascending aorta (3.8 cm) but it is not large enough that it would need repair. She will need SBE prophylaxis with dental work.    2. Hyperlipidemia: On atorvastatin.   3. CAD: Nonobstructive on pre-op coronary angiography.   4. Atrial fibrillation: Paroxysmal. She did not tolerate amiodarone. Continue warfarin. CHADSVASC is at least 4.   5. Atrial flutter - noted today. Adding Lopressor 25 mg BID. Arrange cardioversion with Dr. Aundra Dubin for later this week. She has been therapeutic with her coumadin. Lab today. Get her echo updated. The procedure is reviewed with her and she is willing to proceed. Will arrange for EP consult as well to discuss other options - ?meds versus ablation.   Current medicines are reviewed with the patient today.  The patient does not have concerns regarding medicines other than what has been noted above.  The following changes have been made:  See above.  Labs/ tests ordered today include:    Orders Placed This Encounter  Procedures  . Basic metabolic panel  . CBC  . Protime-INR  . APTT  . TSH  . Ambulatory referral to Cardiac Electrophysiology  . EKG 12-Lead  .  ECHOCARDIOGRAM COMPLETE     Disposition:   Further disposition to follow.    Patient is agreeable to this plan and will call if any problems develop in the interim.   Signed: Burtis Junes, RN, ANP-C 07/30/2015 2:46 PM  [place] [address] [place],   [address] Phone: [phone] Fax: [phone]

## 2015-07-31 ENCOUNTER — Other Ambulatory Visit: Payer: Self-pay | Admitting: Nurse Practitioner

## 2015-07-31 DIAGNOSIS — M542 Cervicalgia: Secondary | ICD-10-CM | POA: Diagnosis not present

## 2015-07-31 LAB — APTT: aPTT: 33 seconds (ref 24–37)

## 2015-07-31 LAB — PROTIME-INR
INR: 1.97 — ABNORMAL HIGH (ref <1.50)
Prothrombin Time: 22.7 seconds — ABNORMAL HIGH (ref 11.6–15.2)

## 2015-07-31 NOTE — Telephone Encounter (Signed)
Yes she has to stay on her coumadin - DO NOT STOP

## 2015-08-02 ENCOUNTER — Ambulatory Visit (HOSPITAL_COMMUNITY)
Admission: RE | Admit: 2015-08-02 | Discharge: 2015-08-02 | Disposition: A | Discharge status: Home / Self Care | Payer: Medicare Other | Source: Ambulatory Visit | Attending: Cardiology | Admitting: Cardiology

## 2015-08-02 ENCOUNTER — Other Ambulatory Visit: Payer: Self-pay | Admitting: Physician Assistant

## 2015-08-02 ENCOUNTER — Encounter (HOSPITAL_COMMUNITY)
Admission: RE | Disposition: A | Discharge status: Home / Self Care | Payer: Self-pay | Source: Ambulatory Visit | Attending: Cardiology

## 2015-08-02 ENCOUNTER — Ambulatory Visit (INDEPENDENT_AMBULATORY_CARE_PROVIDER_SITE_OTHER): Payer: Medicare Other

## 2015-08-02 DIAGNOSIS — R42 Dizziness and giddiness: Secondary | ICD-10-CM

## 2015-08-02 DIAGNOSIS — I4891 Unspecified atrial fibrillation: Secondary | ICD-10-CM

## 2015-08-02 DIAGNOSIS — M542 Cervicalgia: Secondary | ICD-10-CM | POA: Diagnosis not present

## 2015-08-02 DIAGNOSIS — I9789 Other postprocedural complications and disorders of the circulatory system, not elsewhere classified: Secondary | ICD-10-CM

## 2015-08-02 SURGERY — CANCELLED PROCEDURE

## 2015-08-02 MED ORDER — SODIUM CHLORIDE 0.9 % IV SOLN: INTRAVENOUS | Status: DC

## 2015-08-02 NOTE — Progress Notes (Signed)
Patient presented for DCCV today.   She is back in NSR.    On discussion, she has had frequent flutters and lightheadedness over the last few months, predating the discovery of atrial fibrillation last week.  She tried amiodarone in the past (after her cardiac surgery) but had intolerable side effects.  I am going to have her wear a 30 day monitor.  If she has further atrial fibrillation on monitor, would consider Tikosyn versus ablation.  If no further fibrillation, will continue to monitor.  She will see Dr Rayann Heman 12/29 and me next month. She will continue metoprolol but can decrease to 12.5 mg bid.   Becky Winters 08/02/2015 12:11 PM

## 2015-08-02 NOTE — Progress Notes (Signed)
Patient in Sinus Loletha Grayer on arrival for cardioversion today. EKG obtained showing same. Dr. Aundra Dubin aware. Procedure cancelled.

## 2015-08-07 DIAGNOSIS — M542 Cervicalgia: Secondary | ICD-10-CM | POA: Diagnosis not present

## 2015-08-09 DIAGNOSIS — M542 Cervicalgia: Secondary | ICD-10-CM | POA: Diagnosis not present

## 2015-08-15 ENCOUNTER — Ambulatory Visit (HOSPITAL_COMMUNITY): Payer: Medicare Other | Attending: Cardiology

## 2015-08-15 ENCOUNTER — Other Ambulatory Visit: Payer: Self-pay

## 2015-08-15 DIAGNOSIS — I4892 Unspecified atrial flutter: Secondary | ICD-10-CM | POA: Diagnosis not present

## 2015-08-16 ENCOUNTER — Telehealth: Payer: Self-pay | Admitting: Nurse Practitioner

## 2015-08-16 NOTE — Telephone Encounter (Signed)
Informed pt of echo results. Pt verbalized understanding. Pt has appt with Dr. Rayann Heman on 12/29.

## 2015-08-16 NOTE — Telephone Encounter (Signed)
F/u  Pt returning call concerning echo results. Please call back and discuss.

## 2015-08-23 ENCOUNTER — Encounter: Payer: Self-pay | Admitting: Internal Medicine

## 2015-08-23 ENCOUNTER — Ambulatory Visit (INDEPENDENT_AMBULATORY_CARE_PROVIDER_SITE_OTHER): Payer: Medicare Other | Admitting: Internal Medicine

## 2015-08-23 VITALS — BP 124/70 | HR 51 | Ht 64.5 in | Wt 169.0 lb

## 2015-08-23 DIAGNOSIS — R42 Dizziness and giddiness: Secondary | ICD-10-CM

## 2015-08-23 DIAGNOSIS — I483 Typical atrial flutter: Secondary | ICD-10-CM

## 2015-08-23 DIAGNOSIS — Z954 Presence of other heart-valve replacement: Secondary | ICD-10-CM | POA: Diagnosis not present

## 2015-08-23 DIAGNOSIS — I480 Paroxysmal atrial fibrillation: Secondary | ICD-10-CM | POA: Diagnosis not present

## 2015-08-23 DIAGNOSIS — R002 Palpitations: Secondary | ICD-10-CM | POA: Diagnosis not present

## 2015-08-23 DIAGNOSIS — Z953 Presence of xenogenic heart valve: Secondary | ICD-10-CM

## 2015-08-23 NOTE — Patient Instructions (Signed)
Medication Instructions:  Your physician recommends that you continue on your current medications as directed. Please refer to the Current Medication list given to you today.   Labwork: None ordered   Testing/Procedures: None ordered   Follow-Up: Your physician wants you to follow-up in: AS NEEDED with Dr. Rayann Heman   Any Other Special Instructions Will Be Listed Below (If Applicable).     If you need a refill on your cardiac medications before your next appointment, please call your pharmacy.

## 2015-08-25 DIAGNOSIS — I480 Paroxysmal atrial fibrillation: Secondary | ICD-10-CM | POA: Insufficient documentation

## 2015-08-25 NOTE — Progress Notes (Addendum)
Electrophysiology Office Note   Date:  08/29/2015   ID:  Becky Winters, DOB [birth year], MRN [id number]  PCP:  Sheela Stack, MD  Cardiologist:  Dr Aundra Dubin Primary Electrophysiologist: Thompson Grayer, MD    Chief Complaint  Patient presents with  . PAF     History of Present Illness: Becky Winters is a 74 y.o. female who presents today for electrophysiology evaluation.   The patient has a h/o valvular heart disease.  She is s/p AVR in 2015.  She had post operative atrial fibrillation. This was recurrent and ultimately she was started on amiodarone and warfarin. She had multiple side effects with amiodarone so this was stopped. She also had problems with orthostatic symptoms post-op, this resolved.  She recently presented to see Becky Winters.  She was noted to have typical appearding atrial flutter.  She was placed on metoprolol and scheduled for cardioversion.  Prior to cardioversion, she spontaneously converted to sinus rhythm.  She has remained in sinus rhythm since that time.  Presently, she is back to baseline.  She has recently had some palpitations as well as dizziness.  It is not clear as to whether these are caused by an arrhythmia.  She is currently wearing an event monitor.  Thus far, it only reveals sinus rhythm.   Today, she denies symptoms of palpitations, chest pain, shortness of breath, orthopnea, PND, lower extremity edema, claudication,  presyncope, syncope, bleeding, or neurologic sequela. The patient is tolerating medications without difficulties and is otherwise without complaint today.    Past Medical History  Diagnosis Date  . Collagenous colitis   . TIA (transient ischemic attack)   . Hypothyroidism   . HLD (hyperlipidemia)   . Severe aortic stenosis 05/21/2014    s/p AVR 05/2014  . Orthostatic hypotension 05/10/2014  . Chronic diastolic congestive heart failure ([place])   . Anxiety     conditional, taking in preparation for surgery   . GERD  (gastroesophageal reflux disease)     prevacid on occasion  . Compression, esophagus 2013    stretched in the past   . Foot swelling     - LEFT-ever since she had an injury as a child   . H/O hiatal hernia   . UTI (urinary tract infection)     frequent UTI-----pt. reports that she takes Cipro if needed, last UTI- July 2015  . 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/07/2014    23 mm [facility] Ease bovine pericardial tissue valve  . Postoperative atrial fibrillation ([place]) 06/09/2014  . Hx of echocardiogram     post AVR >> Echo (12/15):  Mild LVH, EF 55%, AVR ok (Mean gradient (S): 10 mm Hg), mild MR, mild LAE  . Bicuspid aortic valve 05/10/2014  . Encounter for therapeutic drug monitoring 06/15/2014  . Exertional dyspnea 05/10/2014   Past Surgical History  Procedure Laterality Date  . Cardiolite  2003, 2006  . Esophageal dilation    . Appendectomy    . Cholecystectomy    . Total abdominal hysterectomy      PARTIAL  . Tonsillectomy    . Nasal septum surgery  1995  . Bladder surgery      x2 for tacking  . Thumb arthroscopy Left 2006    ? replacement of ligament   . Cardiac catheterization    . Aortic valve replacement N/A 06/07/2014  Procedure: AORTIC VALVE REPLACEMENT  (AVR) with 80mm Aortic Perimount Magna Ease;  Surgeon: Rexene Alberts, MD;  Location: [place];  Service: Open Heart Surgery;  Laterality: N/A;  . Intraoperative transesophageal echocardiogram N/A 06/07/2014    Procedure: INTRAOPERATIVE TRANSESOPHAGEAL ECHOCARDIOGRAM;  Surgeon: Rexene Alberts, MD;  Location: [place];  Service: Open Heart Surgery;  Laterality: N/A;  . Left and right heart catheterization with coronary angiogram N/A 05/25/2014    Procedure: LEFT AND RIGHT HEART CATHETERIZATION WITH CORONARY ANGIOGRAM;  Surgeon: Larey Dresser, MD;  Location: [facility] CATH LAB;  Service: Cardiovascular;  Laterality: N/A;      Current Outpatient Prescriptions  Medication Sig Dispense Refill  . ALPRAZolam (XANAX) 0.25 MG tablet Take 0.25 mg by mouth daily as needed for sleep.     Marland Kitchen atorvastatin (LIPITOR) 40 MG tablet Take 20 mg by mouth at bedtime.     Marland Kitchen azithromycin (ZITHROMAX) 250 MG tablet Take 2 tablets 30-60 minutes before dental procedure 2 each 2  . b complex vitamins tablet Take 1 tablet by mouth daily.     . beta carotene w/minerals (OCUVITE) tablet Take 1 tablet by mouth daily.     . Biotin 5000 MCG TABS Take 1 tablet by mouth daily.    . cholecalciferol (VITAMIN D) 1000 UNITS tablet Take 1,000 Units by mouth 2 (two) times daily.     . Cinnamon 500 MG TABS Take 1 tablet by mouth 2 (two) times daily.     . Coenzyme Q10 (COQ-10) 100 MG CAPS Take 1 tablet by mouth 2 (two) times daily.    . DiphenhydrAMINE HCl, Sleep, 50 MG CAPS Take 1 capsule by mouth at bedtime.     . folic acid (FOLVITE) Q000111Q MCG tablet Take 800 mcg by mouth daily.     Marland Kitchen levothyroxine (SYNTHROID, LEVOTHROID) 125 MCG tablet Take 125 mcg by mouth daily. 1/2 ON SUNDAY    . Melatonin 3 MG TABS Take 1 tablet by mouth at bedtime.     . metoprolol tartrate (LOPRESSOR) 25 MG tablet Take 12.5 mg by mouth 2 (two) times daily.    . nitrofurantoin (MACRODANTIN) 100 MG capsule Take 100 mg by mouth as directed.     Jonna Coup Leaf Extract 500 MG CAPS Take 2 capsules by mouth daily.    . Omega-3 Fatty Acids (FISH OIL PO) Take 1,400 mg by mouth 2 (two) times daily.    Marland Kitchen warfarin (COUMADIN) 2.5 MG tablet TAKE 1 TABLET BY MOUTH DAILY OR AS DIRECTED BY COUMADIN CLINIC 90 tablet 0   No current facility-administered medications for this visit.    Allergies:   Amiodarone; Clindamycin/lincomycin; Asacol; Codeine; Pentasa; Sulfa antibiotics; and Amoxicillin   Social History:  The patient  reports that she has never smoked. She does not have any smokeless tobacco history on file. She reports that she does not drink alcohol or use illicit drugs.   Family  History:  The patient's  family history includes CAD (age of onset: 41) in her father; Heart attack in her father and paternal grandfather; Stroke in her paternal grandmother. There is no history of Hypertension.    ROS:  Please see the history of present illness.   All other systems are reviewed and negative.    PHYSICAL EXAM: VS:  BP 124/70 mmHg  Pulse 51  Ht 5' 4.5" (1.638 m)  Wt 169 lb (76.658 kg)  BMI 28.57 kg/m2 , BMI Body mass index is 28.57 kg/(m^2). GEN: Well nourished, well developed, in no  acute distress HEENT: normal Neck: no JVD, carotid bruits, or masses Cardiac: RRR; no murmurs, rubs, or gallops,no edema  Respiratory:  clear to auscultation bilaterally, normal work of breathing GI: soft, nontender, nondistended, + BS MS: no deformity or atrophy Skin: warm and dry  Neuro:  Strength and sensation are intact Psych: euthymic mood, full affect  EKG:  EKG reviewed from recent visit with Becky Winters and reveals typical appearing atrial flutter She is presently wearing an event monitor which reveals only sinus rhythm thus far.   Recent Labs: 07/30/2015: BUN 20; Creat 1.01*; Hemoglobin 13.9; Platelets 297; Potassium 4.0; Sodium 140; TSH 0.200*    Lipid Panel  No results found for: CHOL, TRIG, HDL, CHOLHDL, VLDL, LDLCALC, LDLDIRECT   Wt Readings from Last 3 Encounters:  08/23/15 169 lb (76.658 kg)  07/30/15 165 lb 1.9 oz (74.898 kg)  06/04/15 163 lb (73.936 kg)      Other studies Reviewed: Additional studies/ records that were reviewed today include: Lori Gerhardt's recent notes   ASSESSMENT AND PLAN:  1.  Atrial flutter, prior h/o afib Recent episode of atrial flutter, now back in sinus rhythm Continue long term anticoagulation  2. Palpitations and dizziness.  I am not convinced that these are arrhythmogenic in nature. Unfortunately, she stills wears her event monitor and thus do not have a full report to review. I have reviewed what is currently  available with the monitor and only see sinus rhythm so far. I think before we make any decisions about initiation of AAD therapy or consideration of EP procedures, it is best to better characterize her arrhythmia burden and see if there is any correlation to her symptoms. I have therefore made no changes today. She is scheduled to follow-up with Dr Aundra Dubin in the next few weeks regarding the results of her monitor.  I told her that Dr Aundra Dubin and I would review the monitor today and make decisions about the best treatment course based on results.     Follow-up with Dr Aundra Dubin as scheduled  Current medicines are reviewed at length with the patient today.   The patient does not have concerns regarding her medicines.  The following changes were made today:  none  Signed, Thompson Grayer, MD     [address] [place] [address] [phone] (office) [phone] (fax)

## 2015-08-29 DIAGNOSIS — R42 Dizziness and giddiness: Secondary | ICD-10-CM | POA: Insufficient documentation

## 2015-08-29 DIAGNOSIS — I483 Typical atrial flutter: Secondary | ICD-10-CM | POA: Insufficient documentation

## 2015-08-29 DIAGNOSIS — R002 Palpitations: Secondary | ICD-10-CM | POA: Insufficient documentation

## 2015-09-10 DIAGNOSIS — Z1231 Encounter for screening mammogram for malignant neoplasm of breast: Secondary | ICD-10-CM | POA: Diagnosis not present

## 2015-09-12 DIAGNOSIS — R922 Inconclusive mammogram: Secondary | ICD-10-CM | POA: Diagnosis not present

## 2015-09-12 DIAGNOSIS — R928 Other abnormal and inconclusive findings on diagnostic imaging of breast: Secondary | ICD-10-CM | POA: Diagnosis not present

## 2015-09-12 DIAGNOSIS — Z1231 Encounter for screening mammogram for malignant neoplasm of breast: Secondary | ICD-10-CM | POA: Diagnosis not present

## 2015-09-14 ENCOUNTER — Ambulatory Visit (INDEPENDENT_AMBULATORY_CARE_PROVIDER_SITE_OTHER): Payer: Medicare Other | Admitting: *Deleted

## 2015-09-14 ENCOUNTER — Encounter: Payer: Self-pay | Admitting: Cardiology

## 2015-09-14 ENCOUNTER — Ambulatory Visit (INDEPENDENT_AMBULATORY_CARE_PROVIDER_SITE_OTHER): Payer: Medicare Other | Admitting: Cardiology

## 2015-09-14 ENCOUNTER — Ambulatory Visit: Payer: Medicare Other | Admitting: Cardiology

## 2015-09-14 VITALS — BP 116/60 | HR 53 | Ht 64.5 in | Wt 169.0 lb

## 2015-09-14 DIAGNOSIS — Q231 Congenital insufficiency of aortic valve: Secondary | ICD-10-CM

## 2015-09-14 DIAGNOSIS — Z5181 Encounter for therapeutic drug level monitoring: Secondary | ICD-10-CM

## 2015-09-14 DIAGNOSIS — I4891 Unspecified atrial fibrillation: Secondary | ICD-10-CM

## 2015-09-14 DIAGNOSIS — I480 Paroxysmal atrial fibrillation: Secondary | ICD-10-CM | POA: Diagnosis not present

## 2015-09-14 DIAGNOSIS — Z954 Presence of other heart-valve replacement: Secondary | ICD-10-CM | POA: Diagnosis not present

## 2015-09-14 DIAGNOSIS — R0609 Other forms of dyspnea: Secondary | ICD-10-CM | POA: Diagnosis not present

## 2015-09-14 DIAGNOSIS — Z953 Presence of xenogenic heart valve: Secondary | ICD-10-CM

## 2015-09-14 DIAGNOSIS — I9789 Other postprocedural complications and disorders of the circulatory system, not elsewhere classified: Secondary | ICD-10-CM | POA: Diagnosis not present

## 2015-09-14 LAB — POCT INR: INR: 2.1

## 2015-09-14 MED ORDER — AZITHROMYCIN 250 MG PO TABS: ORAL_TABLET | ORAL | Status: DC

## 2015-09-14 MED ORDER — ALPRAZOLAM 0.25 MG PO TABS: 0.2500 mg | ORAL_TABLET | Freq: Every day | ORAL | Status: DC | PRN

## 2015-09-14 NOTE — Patient Instructions (Signed)
Medication Instructions:  Your physician recommends that you continue on your current medications as directed. Please refer to the Current Medication list given to you today.   Labwork: Lab work to be done today--BNP  Testing/Procedures: none  Follow-Up: Your physician wants you to follow-up in:  6 months.  You will receive a reminder letter in the mail two months in advance. If you don't receive a letter, please call our office to schedule the follow-up appointment.   Any Other Special Instructions Will Be Listed Below (If Applicable).     If you need a refill on your cardiac medications before your next appointment, please call your pharmacy.

## 2015-09-15 LAB — BRAIN NATRIURETIC PEPTIDE: BRAIN NATRIURETIC PEPTIDE: 84.4 pg/mL (ref 0.0–100.0)

## 2015-09-16 NOTE — Progress Notes (Signed)
Patient ID: Becky Winters, female   DOB: 13-Nov-1940, 75 y.o.   MRN: [id number] PCP: Dr. Forde Dandy  75 yo with history of bicuspid aortic valve now s/p bioprosthetic AVR with post-op atrial fibrillation, hyperlipidemia, and h/o prior TIA presents for cardiology followup.  She developed severe AS and had bioprosthetic AVR in 10/15.  Post-op, she had paroxysmal atrial fibrillation.  This was recurrent and ultimately she was started on amiodarone and warfarin.  She had multiple side effects with amiodarone so this was stopped.  She also had problems with orthostatic symptoms post-op, this resolved.   In 12/16, she was noted to go into atrial flutter.  She spontaneously converted to NSR so did not require DCCV.  Her TSH was actually low suggesting over-replacement of thyroid hormone at that time.  Event monitor worn after this in 12/16 showed only NSR.    Currently, she has occasional brief palpitations.  No long runs.  She is short of breath with steps but does ok on flat ground.  Occasionally short of breath with housework.  She has a chronic cough, having an endoscopy soon as part of GERD workup.   Labs (8/15): K 5, creatinine 0.9, cortisol normal, LDL 58, HDL 38, TSH/free T4 normal, LFTs normal Labs (9/15): BNP 53 Labs (2/16): K 4.7, creatinine 0.8, LDL 64, HDL 39 Labs (12/16): K 4, creatinine 1.01, HCT 41.3, TSH 0.2 (low)  PMH: 1. Collagenous colitis 2. TIA 3. Hypothyroidism 4. Hyperlipidemia 5. CAD: Cardiolites in 2003 and 2006 normal. LHC (10/15) with 40% proximal LCx stenosis.  6. Esophageal stricture. 7. Bicuspid aortic valve: Diagnosed on 4/12 echo with mild AS.  Repeat echo (9/15) with EF 60-65%, bicuspid aortic valve with severe AS (mean gradient 37 mmHg, peak gradient 68 mmHg, AVA 0.6-0.7 cm^2).  MRA chest (9/15) with 3.8 cm ascending aorta.  She had bioprosthetic aortic valve replacement in 10/15.  Echo postop showed EF 55%, mild LVH, normal-functioning bioprosthetic aortic valve, mild MR.    Echo (12/16) with EF 55-60%, grade II diastolic dysfunction, bioprosthetic aortic valve looked ok with mean gradient 15 mmHg.  8. Cholecystectomy 9. TAH 10. Appendectomy 11. Atrial fibrillation: Paroxysmal.  Patient did not tolerate amiodarone.  12. Orthostatic hypotension.  13. Atrial flutter: Noted in 12/16, spontaneously converted.  Event monitor worn in 12/16 after this episode showed only NSR.   SH: Married, 2 children, nonsmoker, lives in [place].   FH: Father with CAD diagnosed at 4, grandmother with CVA.   ROS: All systems reviewed and negative except as per HPI.   Current Outpatient Prescriptions  Medication Sig Dispense Refill  . ALPRAZolam (XANAX) 0.25 MG tablet Take 1 tablet (0.25 mg total) by mouth daily as needed for sleep. 30 tablet 0  . atorvastatin (LIPITOR) 40 MG tablet Take 20 mg by mouth at bedtime.     Marland Kitchen azithromycin (ZITHROMAX) 250 MG tablet Take 2 tablets 30-60 minutes before dental procedure 6 tablet 4  . b complex vitamins tablet Take 1 tablet by mouth daily.     . beta carotene w/minerals (OCUVITE) tablet Take 1 tablet by mouth daily.     . Biotin 5000 MCG TABS Take 1 tablet by mouth daily.    . cholecalciferol (VITAMIN D) 1000 UNITS tablet Take 1,000 Units by mouth 2 (two) times daily.     . Cinnamon 500 MG TABS Take 1 tablet by mouth 2 (two) times daily.     . Coenzyme Q10 (COQ-10) 100 MG CAPS Take 1 tablet by mouth 2 (  two) times daily.    . DiphenhydrAMINE HCl, Sleep, 50 MG CAPS Take 1 capsule by mouth at bedtime.     . folic acid (FOLVITE) Q000111Q MCG tablet Take 800 mcg by mouth daily.     Marland Kitchen levothyroxine (SYNTHROID, LEVOTHROID) 100 MCG tablet Take 1 tablet by mouth daily.    . Melatonin 3 MG TABS Take 1 tablet by mouth at bedtime.     . metoprolol tartrate (LOPRESSOR) 25 MG tablet Take 12.5 mg by mouth 2 (two) times daily.    . nitrofurantoin (MACRODANTIN) 100 MG capsule Take 100 mg by mouth as directed.     Jonna Coup Leaf Extract 500 MG CAPS Take 2  capsules by mouth daily.    . Omega-3 Fatty Acids (FISH OIL PO) Take 1,400 mg by mouth 2 (two) times daily.    Marland Kitchen warfarin (COUMADIN) 2.5 MG tablet TAKE 1 TABLET BY MOUTH DAILY OR AS DIRECTED BY COUMADIN CLINIC 90 tablet 0   No current facility-administered medications for this visit.    BP 116/60 mmHg  Pulse 53  Ht 5' 4.5" (1.638 m)  Wt 169 lb (76.658 kg)  BMI 28.57 kg/m2 General: NAD Neck: No JVD, no thyromegaly or thyroid nodule.  Lungs: Occasional rhonchi.  CV: Nondisplaced PMI.  Heart regular S1/S2, no S3/S4, 1/6 SEM RUSB.  No edema.  No carotid bruit.  Normal pedal pulses.  Abdomen: Soft, nontender, no hepatosplenomegaly, no distention.  Skin: Intact without lesions or rashes.  Neurologic: Alert and oriented x 3.  Psych: Normal affect. Extremities: No clubbing or cyanosis.   Assessment/Plan: 1. Aortic stenosis: Bicuspid aortic valve disorder now s/p bioprosthetic aortic valve replacement.  Valve was stable on echo in 12/16.  She has an ectatic ascending aorta (3.8 cm) but it is not large enough that it would need repair.  She will need SBE prophylaxis with dental work.  2. Hyperlipidemia: Good lipids on atorvastatin.   3. CAD: Nonobstructive on pre-op coronary angiography.   4. Atrial fibrillation/flutter: Paroxysmal.  She remains in NSR.  She did not tolerate amiodarone.  Continue warfarin.  Event monitor in 12/16 after flutter episode showed only NSR.  TSH was low when atrial flutter occurred, may be related to over-replacement of thyroid hormone.  5. Exertional dyspnea: She is not volume overloaded on exam.  I will check a BNP.  Deconditioning may play the major role.   Loralie Champagne 09/16/2015

## 2015-09-25 DIAGNOSIS — Z86010 Personal history of colonic polyps: Secondary | ICD-10-CM | POA: Diagnosis not present

## 2015-09-25 DIAGNOSIS — R1314 Dysphagia, pharyngoesophageal phase: Secondary | ICD-10-CM | POA: Diagnosis not present

## 2015-10-01 ENCOUNTER — Telehealth: Payer: Self-pay | Admitting: Cardiology

## 2015-10-01 NOTE — Telephone Encounter (Signed)
New Message:  Loma Sousa called in wanting to know if the surgical clearance for the pt was received. She says that the pt  Is scheduled to have a Colonoscopy and Endoscopy on 2/21 and the doctor wanted to know if she can hold her Coumadin 5 days before the procedure and if she can take her antibiotics 2 hours before her procedure. Please /fu

## 2015-10-01 NOTE — Telephone Encounter (Signed)
Clearance was faxed 09/28/15 to [facility]. I also re-faxed the clearance again this am.

## 2015-10-15 DIAGNOSIS — R8290 Unspecified abnormal findings in urine: Secondary | ICD-10-CM | POA: Diagnosis not present

## 2015-10-15 DIAGNOSIS — N390 Urinary tract infection, site not specified: Secondary | ICD-10-CM | POA: Diagnosis not present

## 2015-10-15 DIAGNOSIS — E038 Other specified hypothyroidism: Secondary | ICD-10-CM | POA: Diagnosis not present

## 2015-10-15 DIAGNOSIS — I2510 Atherosclerotic heart disease of native coronary artery without angina pectoris: Secondary | ICD-10-CM | POA: Diagnosis not present

## 2015-10-15 DIAGNOSIS — Z0000 Encounter for general adult medical examination without abnormal findings: Secondary | ICD-10-CM | POA: Diagnosis not present

## 2015-10-16 DIAGNOSIS — Z86010 Personal history of colonic polyps: Secondary | ICD-10-CM | POA: Diagnosis not present

## 2015-10-16 DIAGNOSIS — K222 Esophageal obstruction: Secondary | ICD-10-CM | POA: Diagnosis not present

## 2015-10-16 DIAGNOSIS — Z1211 Encounter for screening for malignant neoplasm of colon: Secondary | ICD-10-CM | POA: Diagnosis not present

## 2015-10-16 DIAGNOSIS — R1310 Dysphagia, unspecified: Secondary | ICD-10-CM | POA: Diagnosis not present

## 2015-10-20 ENCOUNTER — Other Ambulatory Visit: Payer: Self-pay | Admitting: Cardiology

## 2015-10-22 DIAGNOSIS — Z1389 Encounter for screening for other disorder: Secondary | ICD-10-CM | POA: Diagnosis not present

## 2015-10-22 DIAGNOSIS — Z952 Presence of prosthetic heart valve: Secondary | ICD-10-CM | POA: Diagnosis not present

## 2015-10-22 DIAGNOSIS — I779 Disorder of arteries and arterioles, unspecified: Secondary | ICD-10-CM | POA: Diagnosis not present

## 2015-10-22 DIAGNOSIS — K222 Esophageal obstruction: Secondary | ICD-10-CM | POA: Diagnosis not present

## 2015-10-22 DIAGNOSIS — Z0000 Encounter for general adult medical examination without abnormal findings: Secondary | ICD-10-CM | POA: Diagnosis not present

## 2015-10-22 DIAGNOSIS — I2510 Atherosclerotic heart disease of native coronary artery without angina pectoris: Secondary | ICD-10-CM | POA: Diagnosis not present

## 2015-10-22 DIAGNOSIS — R0609 Other forms of dyspnea: Secondary | ICD-10-CM | POA: Diagnosis not present

## 2015-10-22 DIAGNOSIS — Z6828 Body mass index (BMI) 28.0-28.9, adult: Secondary | ICD-10-CM | POA: Diagnosis not present

## 2015-10-22 DIAGNOSIS — E784 Other hyperlipidemia: Secondary | ICD-10-CM | POA: Diagnosis not present

## 2015-10-22 DIAGNOSIS — H547 Unspecified visual loss: Secondary | ICD-10-CM | POA: Diagnosis not present

## 2015-10-22 DIAGNOSIS — I480 Paroxysmal atrial fibrillation: Secondary | ICD-10-CM | POA: Diagnosis not present

## 2015-10-22 DIAGNOSIS — J984 Other disorders of lung: Secondary | ICD-10-CM | POA: Diagnosis not present

## 2015-10-24 ENCOUNTER — Other Ambulatory Visit: Payer: Self-pay | Admitting: Endocrinology

## 2015-10-24 DIAGNOSIS — H547 Unspecified visual loss: Secondary | ICD-10-CM

## 2015-10-26 ENCOUNTER — Ambulatory Visit (INDEPENDENT_AMBULATORY_CARE_PROVIDER_SITE_OTHER): Payer: Medicare Other | Admitting: *Deleted

## 2015-10-26 DIAGNOSIS — Q231 Congenital insufficiency of aortic valve: Secondary | ICD-10-CM | POA: Diagnosis not present

## 2015-10-26 DIAGNOSIS — I9789 Other postprocedural complications and disorders of the circulatory system, not elsewhere classified: Secondary | ICD-10-CM

## 2015-10-26 DIAGNOSIS — Z5181 Encounter for therapeutic drug level monitoring: Secondary | ICD-10-CM | POA: Diagnosis not present

## 2015-10-26 DIAGNOSIS — Z953 Presence of xenogenic heart valve: Secondary | ICD-10-CM

## 2015-10-26 DIAGNOSIS — Z954 Presence of other heart-valve replacement: Secondary | ICD-10-CM | POA: Diagnosis not present

## 2015-10-26 DIAGNOSIS — I4891 Unspecified atrial fibrillation: Secondary | ICD-10-CM

## 2015-10-26 LAB — POCT INR: INR: 1.6

## 2015-10-26 MED ORDER — WARFARIN SODIUM 2.5 MG PO TABS: ORAL_TABLET | ORAL | Status: DC

## 2015-11-02 ENCOUNTER — Ambulatory Visit
Admission: RE | Admit: 2015-11-02 | Discharge: 2015-11-02 | Disposition: A | Discharge status: Home / Self Care | Payer: Medicare Other | Source: Ambulatory Visit | Attending: Endocrinology | Admitting: Endocrinology

## 2015-11-02 DIAGNOSIS — H5462 Unqualified visual loss, left eye, normal vision right eye: Secondary | ICD-10-CM | POA: Diagnosis not present

## 2015-11-02 DIAGNOSIS — H547 Unspecified visual loss: Secondary | ICD-10-CM

## 2015-11-02 MED ORDER — GADOBENATE DIMEGLUMINE 529 MG/ML IV SOLN
15.0000 mL | Freq: Once | INTRAVENOUS | Status: AC | PRN
  Administered 2015-11-02: 15 mL via INTRAVENOUS

## 2015-11-13 ENCOUNTER — Ambulatory Visit (INDEPENDENT_AMBULATORY_CARE_PROVIDER_SITE_OTHER): Payer: Medicare Other | Admitting: Pharmacist

## 2015-11-13 DIAGNOSIS — I9789 Other postprocedural complications and disorders of the circulatory system, not elsewhere classified: Secondary | ICD-10-CM | POA: Diagnosis not present

## 2015-11-13 DIAGNOSIS — I4891 Unspecified atrial fibrillation: Secondary | ICD-10-CM

## 2015-11-13 DIAGNOSIS — Z954 Presence of other heart-valve replacement: Secondary | ICD-10-CM

## 2015-11-13 DIAGNOSIS — Q231 Congenital insufficiency of aortic valve: Secondary | ICD-10-CM | POA: Diagnosis not present

## 2015-11-13 DIAGNOSIS — Z5181 Encounter for therapeutic drug level monitoring: Secondary | ICD-10-CM

## 2015-11-13 DIAGNOSIS — Z953 Presence of xenogenic heart valve: Secondary | ICD-10-CM

## 2015-11-13 LAB — POCT INR: INR: 2.4

## 2015-11-29 DIAGNOSIS — Z87440 Personal history of urinary (tract) infections: Secondary | ICD-10-CM | POA: Diagnosis not present

## 2015-11-29 DIAGNOSIS — Z0000 Encounter for general adult medical examination without abnormal findings: Secondary | ICD-10-CM | POA: Diagnosis not present

## 2015-11-29 DIAGNOSIS — N8111 Cystocele, midline: Secondary | ICD-10-CM | POA: Diagnosis not present

## 2015-12-25 ENCOUNTER — Ambulatory Visit (INDEPENDENT_AMBULATORY_CARE_PROVIDER_SITE_OTHER): Payer: Medicare Other | Admitting: *Deleted

## 2015-12-25 DIAGNOSIS — Z954 Presence of other heart-valve replacement: Secondary | ICD-10-CM | POA: Diagnosis not present

## 2015-12-25 DIAGNOSIS — I9789 Other postprocedural complications and disorders of the circulatory system, not elsewhere classified: Secondary | ICD-10-CM | POA: Diagnosis not present

## 2015-12-25 DIAGNOSIS — Q231 Congenital insufficiency of aortic valve: Secondary | ICD-10-CM | POA: Diagnosis not present

## 2015-12-25 DIAGNOSIS — Z5181 Encounter for therapeutic drug level monitoring: Secondary | ICD-10-CM | POA: Diagnosis not present

## 2015-12-25 DIAGNOSIS — I4891 Unspecified atrial fibrillation: Secondary | ICD-10-CM

## 2015-12-25 DIAGNOSIS — Z953 Presence of xenogenic heart valve: Secondary | ICD-10-CM

## 2015-12-25 LAB — POCT INR: INR: 2.1

## 2016-02-04 ENCOUNTER — Other Ambulatory Visit: Payer: Self-pay | Admitting: Cardiology

## 2016-02-05 ENCOUNTER — Ambulatory Visit (INDEPENDENT_AMBULATORY_CARE_PROVIDER_SITE_OTHER): Payer: Medicare Other | Admitting: *Deleted

## 2016-02-05 DIAGNOSIS — Z5181 Encounter for therapeutic drug level monitoring: Secondary | ICD-10-CM

## 2016-02-05 DIAGNOSIS — I9789 Other postprocedural complications and disorders of the circulatory system, not elsewhere classified: Secondary | ICD-10-CM | POA: Diagnosis not present

## 2016-02-05 DIAGNOSIS — Q231 Congenital insufficiency of aortic valve: Secondary | ICD-10-CM

## 2016-02-05 DIAGNOSIS — Z954 Presence of other heart-valve replacement: Secondary | ICD-10-CM | POA: Diagnosis not present

## 2016-02-05 DIAGNOSIS — I4891 Unspecified atrial fibrillation: Secondary | ICD-10-CM

## 2016-02-05 DIAGNOSIS — Z953 Presence of xenogenic heart valve: Secondary | ICD-10-CM

## 2016-02-05 LAB — POCT INR: INR: 2

## 2016-03-11 DIAGNOSIS — Z09 Encounter for follow-up examination after completed treatment for conditions other than malignant neoplasm: Secondary | ICD-10-CM | POA: Diagnosis not present

## 2016-03-11 DIAGNOSIS — R921 Mammographic calcification found on diagnostic imaging of breast: Secondary | ICD-10-CM | POA: Diagnosis not present

## 2016-03-12 ENCOUNTER — Ambulatory Visit (INDEPENDENT_AMBULATORY_CARE_PROVIDER_SITE_OTHER): Payer: Medicare Other | Admitting: *Deleted

## 2016-03-12 ENCOUNTER — Encounter: Payer: Self-pay | Admitting: Cardiology

## 2016-03-12 ENCOUNTER — Ambulatory Visit (INDEPENDENT_AMBULATORY_CARE_PROVIDER_SITE_OTHER): Payer: Medicare Other | Admitting: Cardiology

## 2016-03-12 VITALS — BP 124/68 | HR 57 | Ht 64.5 in | Wt 174.0 lb

## 2016-03-12 DIAGNOSIS — I9789 Other postprocedural complications and disorders of the circulatory system, not elsewhere classified: Secondary | ICD-10-CM | POA: Diagnosis not present

## 2016-03-12 DIAGNOSIS — Z5181 Encounter for therapeutic drug level monitoring: Secondary | ICD-10-CM

## 2016-03-12 DIAGNOSIS — I480 Paroxysmal atrial fibrillation: Secondary | ICD-10-CM

## 2016-03-12 DIAGNOSIS — Q231 Congenital insufficiency of aortic valve: Secondary | ICD-10-CM | POA: Diagnosis not present

## 2016-03-12 DIAGNOSIS — I5032 Chronic diastolic (congestive) heart failure: Secondary | ICD-10-CM

## 2016-03-12 DIAGNOSIS — Z954 Presence of other heart-valve replacement: Secondary | ICD-10-CM | POA: Diagnosis not present

## 2016-03-12 DIAGNOSIS — Z953 Presence of xenogenic heart valve: Secondary | ICD-10-CM

## 2016-03-12 DIAGNOSIS — I4891 Unspecified atrial fibrillation: Secondary | ICD-10-CM

## 2016-03-12 DIAGNOSIS — R0609 Other forms of dyspnea: Secondary | ICD-10-CM

## 2016-03-12 LAB — CBC WITH DIFFERENTIAL/PLATELET
Basophils Absolute: 79 cells/uL (ref 0–200)
Basophils Relative: 1 %
EOS PCT: 2 %
Eosinophils Absolute: 158 cells/uL (ref 15–500)
HCT: 41.7 % (ref 35.0–45.0)
HEMOGLOBIN: 14 g/dL (ref 11.7–15.5)
LYMPHS ABS: 2765 {cells}/uL (ref 850–3900)
Lymphocytes Relative: 35 %
MCH: 29 pg (ref 27.0–33.0)
MCHC: 33.6 g/dL (ref 32.0–36.0)
MCV: 86.3 fL (ref 80.0–100.0)
MPV: 9.9 fL (ref 7.5–12.5)
Monocytes Absolute: 553 cells/uL (ref 200–950)
Monocytes Relative: 7 %
NEUTROS ABS: 4345 {cells}/uL (ref 1500–7800)
Neutrophils Relative %: 55 %
Platelets: 240 10*3/uL (ref 140–400)
RBC: 4.83 MIL/uL (ref 3.80–5.10)
RDW: 15 % (ref 11.0–15.0)
WBC: 7.9 10*3/uL (ref 3.8–10.8)

## 2016-03-12 LAB — LIPID PANEL
CHOL/HDL RATIO: 3.1 ratio (ref <=5.0)
Cholesterol: 111 mg/dL — ABNORMAL LOW (ref 125–200)
HDL: 36 mg/dL — AB (ref >=46)
LDL CALC: 48 mg/dL (ref <130)
Triglycerides: 135 mg/dL (ref <150)
VLDL: 27 mg/dL (ref <30)

## 2016-03-12 LAB — BASIC METABOLIC PANEL
BUN: 21 mg/dL (ref 7–25)
CALCIUM: 8.9 mg/dL (ref 8.6–10.4)
CO2: 21 mmol/L (ref 20–31)
Chloride: 106 mmol/L (ref 98–110)
Creat: 1.17 mg/dL — ABNORMAL HIGH (ref 0.60–0.93)
Glucose, Bld: 94 mg/dL (ref 65–99)
Potassium: 4.2 mmol/L (ref 3.5–5.3)
SODIUM: 142 mmol/L (ref 135–146)

## 2016-03-12 LAB — POCT INR: INR: 2.4

## 2016-03-12 NOTE — Patient Instructions (Signed)
Medication Instructions:  Take prevacid 15mg  daily for 2 weeks to see if this helps your cough.  Stop metoprolol.  Labwork: Lipid profile/CBCd/BMET today  Testing/Procedures: none  Follow-Up: Your physician wants you to follow-up in: 1 year with Dr Aundra Dubin. (July 2018).  You will receive a reminder letter in the mail two months in advance. If you don't receive a letter, please call our office to schedule the follow-up appointment.  .     If you need a refill on your cardiac medications before your next appointment, please call your pharmacy.

## 2016-03-14 NOTE — Progress Notes (Signed)
Patient ID: Becky Winters, female   DOB: 1940-12-25, 75 y.o.   MRN: [id number] PCP: Dr. Forde Dandy  75 yo with history of bicuspid aortic valve now s/p bioprosthetic AVR with post-op atrial fibrillation, hyperlipidemia, and h/o prior TIA presents for cardiology followup.  She developed severe AS and had bioprosthetic AVR in 10/15.  Post-op, she had paroxysmal atrial fibrillation.  This was recurrent and ultimately she was started on amiodarone and warfarin.  She had multiple side effects with amiodarone so this was stopped.  She also had problems with orthostatic symptoms post-op, this resolved.   In 12/16, she was noted to go into atrial flutter.  She spontaneously converted to NSR so did not require DCCV.  Her TSH was actually low suggesting over-replacement of thyroid hormone at that time.  Event monitor worn after this in 12/16 showed only NSR.    Becky Winters is stable symptomatically. Still has chronic cough.  She still has some exertional dyspnea, worse in the heat.  If not hot, she is able to walk on flat ground without problems.  Some generalized fatigue. No palpitations.    ECG: NSR, lateral T wave inversions  Labs (8/15): K 5, creatinine 0.9, cortisol normal, LDL 58, HDL 38, TSH/free T4 normal, LFTs normal Labs (9/15): BNP 53 Labs (2/16): K 4.7, creatinine 0.8, LDL 64, HDL 39 Labs (12/16): K 4, creatinine 1.01, HCT 41.3, TSH 0.2 (low) Labs (1/17): BNP 84  PMH: 1. Collagenous colitis 2. TIA 3. Hypothyroidism 4. Hyperlipidemia 5. CAD: Cardiolites in 2003 and 2006 normal. LHC (10/15) with 40% proximal LCx stenosis.  6. Esophageal stricture. 7. Bicuspid aortic valve: Diagnosed on 4/12 echo with mild AS.  Repeat echo (9/15) with EF 60-65%, bicuspid aortic valve with severe AS (mean gradient 37 mmHg, peak gradient 68 mmHg, AVA 0.6-0.7 cm^2).  MRA chest (9/15) with 3.8 cm ascending aorta.  She had bioprosthetic aortic valve replacement in 10/15.  Echo postop showed EF 55%, mild LVH,  normal-functioning bioprosthetic aortic valve, mild MR.   Echo (12/16) with EF 55-60%, grade II diastolic dysfunction, bioprosthetic aortic valve looked ok with mean gradient 15 mmHg.  8. Cholecystectomy 9. TAH 10. Appendectomy 11. Atrial fibrillation: Paroxysmal.  Patient did not tolerate amiodarone.  12. Orthostatic hypotension.  13. Atrial flutter: Noted in 12/16, spontaneously converted.  Event monitor worn in 12/16 after this episode showed only NSR.   SH: Married, 2 children, nonsmoker, lives in [place].   FH: Father with CAD diagnosed at 27, grandmother with CVA.   ROS: All systems reviewed and negative except as per HPI.   Current Outpatient Prescriptions  Medication Sig Dispense Refill  . ALPRAZolam (XANAX) 0.25 MG tablet Take 1 tablet (0.25 mg total) by mouth daily as needed for sleep. 30 tablet 0  . atorvastatin (LIPITOR) 40 MG tablet Take 20 mg by mouth at bedtime.     Marland Kitchen azithromycin (ZITHROMAX) 250 MG tablet Take 2 tablets 30-60 minutes before dental procedure 6 tablet 4  . b complex vitamins tablet Take 1 tablet by mouth daily.     . beta carotene w/minerals (OCUVITE) tablet Take 1 tablet by mouth daily.     . Biotin 5000 MCG TABS Take 1 tablet by mouth daily.    . cholecalciferol (VITAMIN D) 1000 UNITS tablet Take 1,000 Units by mouth 2 (two) times daily.     . Cinnamon 500 MG TABS Take 1 tablet by mouth 2 (two) times daily.     . Coenzyme Q10 (COQ-10) 100 MG CAPS  Take 1 tablet by mouth 2 (two) times daily.    . DiphenhydrAMINE HCl, Sleep, 50 MG CAPS Take 1 capsule by mouth at bedtime.     . folic acid (FOLVITE) Q000111Q MCG tablet Take 800 mcg by mouth daily.     Marland Kitchen levothyroxine (SYNTHROID, LEVOTHROID) 100 MCG tablet Take 1 tablet by mouth daily.    . Melatonin 3 MG TABS Take 1 tablet by mouth at bedtime.     . nitrofurantoin (MACRODANTIN) 100 MG capsule Take 100 mg by mouth at bedtime.     Becky Winters 500 MG CAPS Take 2 capsules by mouth daily.    . Omega-3  Fatty Acids (FISH OIL PO) Take 1,400 mg by mouth 2 (two) times daily.    Marland Kitchen warfarin (JANTOVEN) 2.5 MG tablet Take as directed by Coumadin Clinic 90 tablet 1  . lansoprazole (PREVACID) 15 MG capsule Use daily for 2 weeks to see if it helps your cough     No current facility-administered medications for this visit.    BP 124/68 mmHg  Pulse 57  Ht 5' 4.5" (1.638 m)  Wt 174 lb (78.926 kg)  BMI 29.42 kg/m2 General: NAD Neck: No JVD, no thyromegaly or thyroid nodule.  Lungs: CTAB CV: Nondisplaced PMI.  Heart regular S1/S2, no S3/S4, 1/6 SEM RUSB.  No edema.  No carotid bruit.  Normal pedal pulses.  Abdomen: Soft, nontender, no hepatosplenomegaly, no distention.  Skin: Intact without lesions or rashes.  Neurologic: Alert and oriented x 3.  Psych: Normal affect. Extremities: No clubbing or cyanosis.   Assessment/Plan: 1. Aortic stenosis: Bicuspid aortic valve disorder now s/p bioprosthetic aortic valve replacement.  Valve was stable on echo in 12/16.  She has an ectatic ascending aorta (3.8 cm) but it is not large enough that it would need repair.  She will need SBE prophylaxis with dental work.  2. Hyperlipidemia: Check lipids today.   3. CAD: Nonobstructive on pre-op coronary angiography.   4. Atrial fibrillation/flutter: Paroxysmal.  She remains in NSR.  She did not tolerate amiodarone.  Continue warfarin.  Event monitor in 12/16 after flutter episode showed only NSR.  TSH was low when atrial flutter occurred, may be related to over-replacement of thyroid hormone.  CBC today given warfarin use.  5. Exertional dyspnea: She is not volume overloaded on exam. BNP has not been elevated.  Deconditioning may play the major role.  - Try to make exercise more regular.  - Will have her stop metoprolol to see if this helps her symptoms.  6. Chronic cough: She has Prevacid at home.  I asked her to try Prevacid for 2 wks regularly to see if this helps her cough (?GERD).   Loralie Champagne 03/14/2016

## 2016-04-21 DIAGNOSIS — E559 Vitamin D deficiency, unspecified: Secondary | ICD-10-CM | POA: Diagnosis not present

## 2016-04-21 DIAGNOSIS — I2510 Atherosclerotic heart disease of native coronary artery without angina pectoris: Secondary | ICD-10-CM | POA: Diagnosis not present

## 2016-04-21 DIAGNOSIS — K529 Noninfective gastroenteritis and colitis, unspecified: Secondary | ICD-10-CM | POA: Diagnosis not present

## 2016-04-21 DIAGNOSIS — K219 Gastro-esophageal reflux disease without esophagitis: Secondary | ICD-10-CM | POA: Diagnosis not present

## 2016-04-21 DIAGNOSIS — I480 Paroxysmal atrial fibrillation: Secondary | ICD-10-CM | POA: Diagnosis not present

## 2016-04-21 DIAGNOSIS — I951 Orthostatic hypotension: Secondary | ICD-10-CM | POA: Diagnosis not present

## 2016-04-21 DIAGNOSIS — Z6829 Body mass index (BMI) 29.0-29.9, adult: Secondary | ICD-10-CM | POA: Diagnosis not present

## 2016-04-21 DIAGNOSIS — R0609 Other forms of dyspnea: Secondary | ICD-10-CM | POA: Diagnosis not present

## 2016-04-21 DIAGNOSIS — Z952 Presence of prosthetic heart valve: Secondary | ICD-10-CM | POA: Diagnosis not present

## 2016-04-21 DIAGNOSIS — E038 Other specified hypothyroidism: Secondary | ICD-10-CM | POA: Diagnosis not present

## 2016-04-21 DIAGNOSIS — K222 Esophageal obstruction: Secondary | ICD-10-CM | POA: Diagnosis not present

## 2016-04-21 DIAGNOSIS — E784 Other hyperlipidemia: Secondary | ICD-10-CM | POA: Diagnosis not present

## 2016-04-22 DIAGNOSIS — H52203 Unspecified astigmatism, bilateral: Secondary | ICD-10-CM | POA: Diagnosis not present

## 2016-04-22 DIAGNOSIS — H2513 Age-related nuclear cataract, bilateral: Secondary | ICD-10-CM | POA: Diagnosis not present

## 2016-04-22 DIAGNOSIS — H532 Diplopia: Secondary | ICD-10-CM | POA: Diagnosis not present

## 2016-04-23 ENCOUNTER — Ambulatory Visit (INDEPENDENT_AMBULATORY_CARE_PROVIDER_SITE_OTHER): Payer: Medicare Other | Admitting: *Deleted

## 2016-04-23 DIAGNOSIS — Z954 Presence of other heart-valve replacement: Secondary | ICD-10-CM | POA: Diagnosis not present

## 2016-04-23 DIAGNOSIS — Z5181 Encounter for therapeutic drug level monitoring: Secondary | ICD-10-CM

## 2016-04-23 DIAGNOSIS — Q231 Congenital insufficiency of aortic valve: Secondary | ICD-10-CM | POA: Diagnosis not present

## 2016-04-23 DIAGNOSIS — I4891 Unspecified atrial fibrillation: Secondary | ICD-10-CM

## 2016-04-23 DIAGNOSIS — Z953 Presence of xenogenic heart valve: Secondary | ICD-10-CM

## 2016-04-23 DIAGNOSIS — I9789 Other postprocedural complications and disorders of the circulatory system, not elsewhere classified: Secondary | ICD-10-CM | POA: Diagnosis not present

## 2016-04-23 DIAGNOSIS — Q2381 Bicuspid aortic valve: Secondary | ICD-10-CM

## 2016-04-23 LAB — POCT INR: INR: 3

## 2016-04-24 ENCOUNTER — Telehealth: Payer: Self-pay | Admitting: *Deleted

## 2016-04-24 DIAGNOSIS — M5136 Other intervertebral disc degeneration, lumbar region: Secondary | ICD-10-CM | POA: Diagnosis not present

## 2016-04-24 DIAGNOSIS — M5416 Radiculopathy, lumbar region: Secondary | ICD-10-CM | POA: Diagnosis not present

## 2016-04-24 DIAGNOSIS — M438X6 Other specified deforming dorsopathies, lumbar region: Secondary | ICD-10-CM | POA: Diagnosis not present

## 2016-04-24 DIAGNOSIS — M47816 Spondylosis without myelopathy or radiculopathy, lumbar region: Secondary | ICD-10-CM | POA: Diagnosis not present

## 2016-04-24 DIAGNOSIS — M545 Low back pain: Secondary | ICD-10-CM | POA: Diagnosis not present

## 2016-04-24 DIAGNOSIS — Z6829 Body mass index (BMI) 29.0-29.9, adult: Secondary | ICD-10-CM | POA: Diagnosis not present

## 2016-04-24 NOTE — Telephone Encounter (Signed)
Pt called stating she has seen her MD today due to her back pain and was given steroid injection today and ordered Prednisone dose pak Prednisone 5mg  take 6 tabs one day then  Prednisone 5mg  tabs 5 then tabs 4 then tabs 3 then tabs 2 and then tab 1 and also given Robaxin 500 mg tid and prescription for Aleve which she states she is not going to start at present . Instructed that Aleve does not make INR increase but does increase risk of stomach irritation and GI bleed. Also instructed that there is interaction between coumadin and Prednisone and pt instructed to take coumadin 1.25 mg tomorrow Sept 1st and take coumadin 1.25mg  on Sept 2nd then continue same dose of coumadin that she was on and then appt made for her to have INR checked on Tuesday Sept 5th and she states understanding

## 2016-04-29 ENCOUNTER — Ambulatory Visit (INDEPENDENT_AMBULATORY_CARE_PROVIDER_SITE_OTHER): Payer: Medicare Other

## 2016-04-29 DIAGNOSIS — Q231 Congenital insufficiency of aortic valve: Secondary | ICD-10-CM

## 2016-04-29 DIAGNOSIS — Z953 Presence of xenogenic heart valve: Secondary | ICD-10-CM

## 2016-04-29 DIAGNOSIS — Z5181 Encounter for therapeutic drug level monitoring: Secondary | ICD-10-CM

## 2016-04-29 DIAGNOSIS — I9789 Other postprocedural complications and disorders of the circulatory system, not elsewhere classified: Secondary | ICD-10-CM | POA: Diagnosis not present

## 2016-04-29 DIAGNOSIS — I4891 Unspecified atrial fibrillation: Secondary | ICD-10-CM

## 2016-04-29 DIAGNOSIS — Z954 Presence of other heart-valve replacement: Secondary | ICD-10-CM

## 2016-04-29 LAB — POCT INR: INR: 2.2

## 2016-05-01 ENCOUNTER — Other Ambulatory Visit: Payer: Self-pay | Admitting: Endocrinology

## 2016-05-01 ENCOUNTER — Encounter: Payer: Self-pay | Admitting: Cardiology

## 2016-05-01 DIAGNOSIS — M5416 Radiculopathy, lumbar region: Secondary | ICD-10-CM

## 2016-05-02 ENCOUNTER — Telehealth: Payer: Self-pay | Admitting: *Deleted

## 2016-05-02 ENCOUNTER — Encounter (HOSPITAL_COMMUNITY): Payer: Self-pay

## 2016-05-02 ENCOUNTER — Emergency Department (HOSPITAL_COMMUNITY): Payer: Medicare Other

## 2016-05-02 ENCOUNTER — Other Ambulatory Visit: Payer: Medicare Other

## 2016-05-02 ENCOUNTER — Emergency Department (HOSPITAL_COMMUNITY)
Admission: EM | Admit: 2016-05-02 | Discharge: 2016-05-02 | Disposition: A | Discharge status: Home / Self Care | Payer: Medicare Other | Attending: Emergency Medicine | Admitting: Emergency Medicine

## 2016-05-02 ENCOUNTER — Other Ambulatory Visit: Payer: Self-pay | Admitting: Endocrinology

## 2016-05-02 DIAGNOSIS — E039 Hypothyroidism, unspecified: Secondary | ICD-10-CM | POA: Diagnosis not present

## 2016-05-02 DIAGNOSIS — Z79899 Other long term (current) drug therapy: Secondary | ICD-10-CM | POA: Insufficient documentation

## 2016-05-02 DIAGNOSIS — X503XXA Overexertion from repetitive movements, initial encounter: Secondary | ICD-10-CM | POA: Insufficient documentation

## 2016-05-02 DIAGNOSIS — S3992XA Unspecified injury of lower back, initial encounter: Secondary | ICD-10-CM | POA: Diagnosis not present

## 2016-05-02 DIAGNOSIS — Y998 Other external cause status: Secondary | ICD-10-CM | POA: Diagnosis not present

## 2016-05-02 DIAGNOSIS — M5432 Sciatica, left side: Secondary | ICD-10-CM | POA: Diagnosis not present

## 2016-05-02 DIAGNOSIS — M5416 Radiculopathy, lumbar region: Secondary | ICD-10-CM

## 2016-05-02 DIAGNOSIS — Y92009 Unspecified place in unspecified non-institutional (private) residence as the place of occurrence of the external cause: Secondary | ICD-10-CM | POA: Diagnosis not present

## 2016-05-02 DIAGNOSIS — Z8673 Personal history of transient ischemic attack (TIA), and cerebral infarction without residual deficits: Secondary | ICD-10-CM | POA: Diagnosis not present

## 2016-05-02 DIAGNOSIS — Y93E5 Activity, floor mopping and cleaning: Secondary | ICD-10-CM | POA: Diagnosis not present

## 2016-05-02 DIAGNOSIS — Z7901 Long term (current) use of anticoagulants: Secondary | ICD-10-CM | POA: Insufficient documentation

## 2016-05-02 DIAGNOSIS — M545 Low back pain: Secondary | ICD-10-CM | POA: Diagnosis present

## 2016-05-02 DIAGNOSIS — M5442 Lumbago with sciatica, left side: Secondary | ICD-10-CM | POA: Diagnosis not present

## 2016-05-02 LAB — PROTIME-INR
INR: 2.43
PROTHROMBIN TIME: 26.9 s — AB (ref 11.4–15.2)

## 2016-05-02 LAB — CBC WITH DIFFERENTIAL/PLATELET
Basophils Absolute: 0 10*3/uL (ref 0.0–0.1)
Basophils Relative: 0 %
EOS ABS: 0.1 10*3/uL (ref 0.0–0.7)
EOS PCT: 1 %
HCT: 45 % (ref 36.0–46.0)
Hemoglobin: 15.3 g/dL — ABNORMAL HIGH (ref 12.0–15.0)
LYMPHS ABS: 2.3 10*3/uL (ref 0.7–4.0)
LYMPHS PCT: 27 %
MCH: 29.3 pg (ref 26.0–34.0)
MCHC: 34 g/dL (ref 30.0–36.0)
MCV: 86.2 fL (ref 78.0–100.0)
MONO ABS: 1 10*3/uL (ref 0.1–1.0)
MONOS PCT: 11 %
Neutro Abs: 5.2 10*3/uL (ref 1.7–7.7)
Neutrophils Relative %: 61 %
PLATELETS: 270 10*3/uL (ref 150–400)
RBC: 5.22 MIL/uL — AB (ref 3.87–5.11)
RDW: 14.5 % (ref 11.5–15.5)
WBC: 8.7 10*3/uL (ref 4.0–10.5)

## 2016-05-02 LAB — COMPREHENSIVE METABOLIC PANEL
ALT: 19 U/L (ref 14–54)
ANION GAP: 8 (ref 5–15)
AST: 22 U/L (ref 15–41)
Albumin: 3.2 g/dL — ABNORMAL LOW (ref 3.5–5.0)
Alkaline Phosphatase: 59 U/L (ref 38–126)
BUN: 23 mg/dL — ABNORMAL HIGH (ref 6–20)
CHLORIDE: 108 mmol/L (ref 101–111)
CO2: 22 mmol/L (ref 22–32)
Calcium: 9.1 mg/dL (ref 8.9–10.3)
Creatinine, Ser: 1.01 mg/dL — ABNORMAL HIGH (ref 0.44–1.00)
GFR, EST NON AFRICAN AMERICAN: 54 mL/min — AB (ref >60)
Glucose, Bld: 101 mg/dL — ABNORMAL HIGH (ref 65–99)
POTASSIUM: 4.2 mmol/L (ref 3.5–5.1)
SODIUM: 138 mmol/L (ref 135–145)
Total Bilirubin: 0.9 mg/dL (ref 0.3–1.2)
Total Protein: 6.5 g/dL (ref 6.5–8.1)

## 2016-05-02 MED ORDER — ONDANSETRON HCL 4 MG/2ML IJ SOLN
4.0000 mg | Freq: Once | INTRAMUSCULAR | Status: AC
  Administered 2016-05-02: 4 mg via INTRAVENOUS
  Filled 2016-05-02: qty 2

## 2016-05-02 MED ORDER — MORPHINE SULFATE (PF) 4 MG/ML IV SOLN
4.0000 mg | Freq: Once | INTRAVENOUS | Status: AC
  Administered 2016-05-02: 4 mg via INTRAVENOUS
  Filled 2016-05-02: qty 1

## 2016-05-02 MED ORDER — PREDNISONE 20 MG PO TABS: ORAL_TABLET | ORAL | 0 refills | Status: DC

## 2016-05-02 MED ORDER — DIAZEPAM 5 MG/ML IJ SOLN
5.0000 mg | Freq: Once | INTRAMUSCULAR | Status: AC
  Administered 2016-05-02: 5 mg via INTRAVENOUS
  Filled 2016-05-02: qty 2

## 2016-05-02 MED ORDER — DEXAMETHASONE SODIUM PHOSPHATE 4 MG/ML IJ SOLN
4.0000 mg | Freq: Once | INTRAMUSCULAR | Status: AC
  Administered 2016-05-02: 4 mg via INTRAVENOUS
  Filled 2016-05-02: qty 1

## 2016-05-02 MED ORDER — OXYCODONE-ACETAMINOPHEN 5-325 MG PO TABS: 1.0000 | ORAL_TABLET | Freq: Four times a day (QID) | ORAL | 0 refills | Status: DC | PRN

## 2016-05-02 NOTE — ED Triage Notes (Signed)
Pt reports having severe back pain x1 week. Pt endorses lower back pain and it shoots down her left leg. Pt a&ox4.

## 2016-05-02 NOTE — ED Provider Notes (Signed)
[facility] DEPT Provider Note   CSN: [id number] Arrival date & time: 05/02/16  0806     History   Chief Complaint Chief Complaint  Patient presents with  . Back Pain    HPI Becky Winters is a 75 y.o. female hx of aortic valve replacement on coumadin, arthritis, here with back pain. Lower back pain radiating down the left leg for the last 2 weeks or so. States that she may have strained her back initially and has been very sore. Over the last week or so it started radiating down the left leg and she feels weak in that leg. Denies any numbness or any trouble urinating. She saw her primary care doctor scheduled for MRI but she was unable to get the test done due to her pig valve. She has been on robaxin, hydrocodone, naprosyn with no relief. No recent spinal injection, no hx of cancer.      The history is provided by the patient.    Past Medical History:  Diagnosis Date  . Anxiety    conditional, taking in preparation for surgery   . 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 Bicuspid aortic valve 05/10/2014  . Chronic diastolic congestive heart failure ([place])   . Collagenous colitis   . Compression, esophagus 2013   stretched in the past   . Encounter for therapeutic drug monitoring 06/15/2014  . Exertional dyspnea 05/10/2014  . Foot swelling    - LEFT-ever since she had an injury as a child   . GERD (gastroesophageal reflux disease)    prevacid on occasion  . H/O hiatal hernia   . HLD (hyperlipidemia)   . Hx of echocardiogram    post AVR >> Echo (12/15):  Mild LVH, EF 55%, AVR ok (Mean gradient (S): 10 mm Hg), mild MR, mild LAE  . Hypothyroidism   . Orthostatic hypotension 05/10/2014  . Postoperative atrial fibrillation ([place]) 06/09/2014  . 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/07/2014   23 mm [facility] Ease bovine pericardial tissue valve  . Severe aortic stenosis 05/21/2014   s/p AVR 05/2014  . TIA  (transient ischemic attack)   . UTI (urinary tract infection)    frequent UTI-----pt. reports that she takes Cipro if needed, last UTI- July 2015    Patient Active Problem List   Diagnosis Date Noted  . Typical atrial flutter ([place]) 08/29/2015  . Palpitations 08/29/2015  . Dizziness 08/29/2015  . Paroxysmal atrial fibrillation ([place]) 08/25/2015  . Collagenous colitis   . TIA (transient ischemic attack)   . Hypothyroidism   . HLD (hyperlipidemia)   . Anxiety   . GERD (gastroesophageal reflux disease)   . Compression, esophagus   . Foot swelling   . H/O hiatal hernia   . UTI (urinary tract infection)   . Arthritis   . Encounter for therapeutic drug monitoring 06/15/2014  . Postoperative atrial fibrillation ([place]) 06/09/2014  . 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 Chronic diastolic congestive heart failure ([place])   . Severe aortic stenosis 05/21/2014  . Exertional dyspnea 05/10/2014  . Bicuspid aortic valve 05/10/2014  . Orthostatic hypotension 05/10/2014    Past Surgical History:  Procedure Laterality Date  . AORTIC VALVE REPLACEMENT N/A 06/07/2014   Procedure: AORTIC VALVE REPLACEMENT  (AVR) with 53mm Aortic Perimount Magna Ease;  Surgeon: Rexene Alberts, MD;  Location: [facility];  Service: Open Heart Surgery;  Laterality:  N/A;  . APPENDECTOMY    . BLADDER SURGERY     x2 for tacking  . CARDIAC CATHETERIZATION    . cardiolite  2003, 2006  . CHOLECYSTECTOMY    . ESOPHAGEAL DILATION    . INTRAOPERATIVE TRANSESOPHAGEAL ECHOCARDIOGRAM N/A 06/07/2014   Procedure: INTRAOPERATIVE TRANSESOPHAGEAL ECHOCARDIOGRAM;  Surgeon: Rexene Alberts, MD;  Location: [facility];  Service: Open Heart Surgery;  Laterality: N/A;  . LEFT AND RIGHT HEART CATHETERIZATION WITH CORONARY ANGIOGRAM N/A 05/25/2014   Procedure: LEFT AND RIGHT HEART CATHETERIZATION WITH CORONARY ANGIOGRAM;  Surgeon: Larey Dresser, MD;  Location: [facility] CATH LAB;  Service: Cardiovascular;  Laterality: N/A;    . NASAL SEPTUM SURGERY  1995  . THUMB ARTHROSCOPY Left 2006   ? replacement of ligament   . TONSILLECTOMY    . TOTAL ABDOMINAL HYSTERECTOMY     PARTIAL    OB History    No data available       Home Medications    Prior to Admission medications   Medication Sig Start Date End Date Taking? Authorizing Provider  acetaminophen (TYLENOL) 325 MG tablet Take 325 mg by mouth every 6 (six) hours as needed (pain).   Yes Historical Provider, MD  atorvastatin (LIPITOR) 40 MG tablet Take 20 mg by mouth at bedtime.    Yes Historical Provider, MD  azithromycin (ZITHROMAX) 250 MG tablet Take 2 tablets 30-60 minutes before dental procedure Patient taking differently: Take 500 mg by mouth See admin instructions. Take 2 tablets (500 mg) by mouth 30-60 minutes before dental procedure 09/14/15  Yes Larey Dresser, MD  b complex vitamins tablet Take 1 tablet by mouth daily.    Yes Historical Provider, MD  beta carotene w/minerals (OCUVITE) tablet Take 1 tablet by mouth 2 (two) times daily.    Yes Historical Provider, MD  Biotin 5000 MCG TABS Take 5,000 mcg by mouth 2 (two) times daily.    Yes Historical Provider, MD  cholecalciferol (VITAMIN D) 1000 UNITS tablet Take 1,000 Units by mouth 2 (two) times daily.    Yes Historical Provider, MD  Cinnamon 500 MG TABS Take 1 tablet by mouth 2 (two) times daily.    Yes Historical Provider, MD  Coenzyme Q10 (COQ-10) 100 MG CAPS Take 100 mg by mouth 2 (two) times daily.    Yes Historical Provider, MD  DiphenhydrAMINE HCl, Sleep, 50 MG CAPS Take 50 mg by mouth at bedtime.    Yes Historical Provider, MD  folic acid (FOLVITE) Q000111Q MCG tablet Take 800 mcg by mouth daily.    Yes Historical Provider, MD  HYDROcodone-acetaminophen (NORCO/VICODIN) 5-325 MG tablet Take 1 tablet by mouth every 8 (eight) hours as needed (severe back pain).  04/29/16  Yes Historical Provider, MD  levothyroxine (SYNTHROID, LEVOTHROID) 100 MCG tablet Take 100 mcg by mouth daily before breakfast.   09/07/15  Yes Historical Provider, MD  LIDOCAINE-MENTHOL EX Apply 1 patch topically daily as needed (back pain).   Yes Historical Provider, MD  Melatonin 3 MG TABS Take 3 mg by mouth at bedtime.    Yes Historical Provider, MD  Menthol 5 % PTCH Apply 1 patch topically daily as needed (back pain).   Yes Historical Provider, MD  methocarbamol (ROBAXIN) 500 MG tablet Take 500 mg by mouth 3 (three) times daily as needed for muscle spasms.  04/24/16  Yes Historical Provider, MD  naproxen (NAPROSYN) 500 MG tablet Take 500 mg by mouth 2 (two) times daily as needed (back pain).  04/24/16  Yes Historical  Provider, MD  nitrofurantoin (MACRODANTIN) 100 MG capsule Take 100 mg by mouth at bedtime. UTI preventative   Yes Historical Provider, MD  OLIVE LEAF EXTRACT PO Take 1,000 mg by mouth 2 (two) times daily.   Yes Historical Provider, MD  Omega-3 Fatty Acids (FISH OIL PO) Take 1,400 mg by mouth 2 (two) times daily.   Yes Historical Provider, MD  warfarin (JANTOVEN) 2.5 MG tablet Take as directed by [facility] Patient taking differently: Take 1.25-2.5 mg by mouth See admin instructions. Take 1 tablet (2.5 mg) by mouth Monday, Wednesday, Friday, Saturday (at 5:30pm), and 1/2 tablet (1.25 mg) Sunday, Tuesday, Thursday (at 5:30pm) or as directed by [facility] 02/04/16  Yes Larey Dresser, MD  ALPRAZolam Duanne Moron) 0.25 MG tablet Take 1 tablet (0.25 mg total) by mouth daily as needed for sleep. Patient not taking: Reported on 05/02/2016 09/14/15   Larey Dresser, MD    Family History Family History  Problem Relation Age of Onset  . CAD Father 70  . Heart attack Father   . Heart attack Paternal Grandfather   . Stroke Paternal Grandmother   . CVA    . Hypertension Neg Hx     Social History Social History  Substance Use Topics  . Smoking status: Never Smoker  . Smokeless tobacco: Not on file  . Alcohol use No     Allergies   Amiodarone; Clindamycin/lincomycin; Asacol [mesalamine]; Codeine; Pentasa  [mesalamine er]; Sulfa antibiotics; and Amoxicillin   Review of Systems Review of Systems  Musculoskeletal: Positive for back pain.  All other systems reviewed and are negative.    Physical Exam Updated Vital Signs BP 113/70   Pulse 74   Temp 98.2 F (36.8 C) (Oral)   Resp 16   Ht 5\' 5"  (1.651 m)   Wt 174 lb (78.9 kg)   SpO2 98%   BMI 28.96 kg/m   Physical Exam  Constitutional: She is oriented to person, place, and time.  Uncomfortable   HENT:  Head: Normocephalic.  Eyes: Pupils are equal, round, and reactive to light.  Neck: Normal range of motion.  Cardiovascular: Normal rate, regular rhythm and normal heart sounds.   Pulmonary/Chest: Effort normal and breath sounds normal.  Abdominal: Soft. Bowel sounds are normal. She exhibits no distension. There is no tenderness. There is no guarding.  No pulsatile mass   Musculoskeletal: Normal range of motion.  + L paralumbar tenderness, no obvious midline tenderness   Neurological: She is alert and oriented to person, place, and time.  + straight leg raise on L side, no saddle anesthesia. ? Slight weakness L hip flexion likely from pain. Otherwise neurovascular intact   Skin: Skin is warm.  Psychiatric: She has a normal mood and affect.  Nursing note and vitals reviewed.    ED Treatments / Results  Labs (all labs ordered are listed, but only abnormal results are displayed) Labs Reviewed  CBC WITH DIFFERENTIAL/PLATELET - Abnormal; Notable for the following:       Result Value   RBC 5.22 (*)    Hemoglobin 15.3 (*)    All other components within normal limits  COMPREHENSIVE METABOLIC PANEL - Abnormal; Notable for the following:    Glucose, Bld 101 (*)    BUN 23 (*)    Creatinine, Ser 1.01 (*)    Albumin 3.2 (*)    GFR calc non Af Amer 54 (*)    All other components within normal limits  PROTIME-INR - Abnormal; Notable for the following:  Prothrombin Time 26.9 (*)    All other components within normal limits     EKG  EKG Interpretation None       Radiology Mr Lumbar Spine Wo Contrast  Result Date: 05/02/2016 CLINICAL DATA:  Pt injured her back about 3 weeks ago cleaning house. Went to Dr on 8/31 and received steroid shot, prednisone pack and muscle relaxers. EXAM: MRI LUMBAR SPINE WITHOUT CONTRAST TECHNIQUE: Multiplanar, multisequence MR imaging of the lumbar spine was performed. No intravenous contrast was administered. COMPARISON:  None. FINDINGS: Segmentation:  Standard. Alignment:  Physiologic. Vertebrae:  No fracture, evidence of discitis, or bone lesion. Conus medullaris: Extends to the L1 level and appears normal. Paraspinal and other soft tissues: No paraspinal abnormality. Disc levels: Disc spaces: Degenerative disc disease with disc height loss at L4-5. T12-L1: No significant disc bulge. No evidence of neural foraminal stenosis. No central canal stenosis. L1-L2: Mild broad-based disc bulge. No evidence of neural foraminal stenosis. No central canal stenosis. L2-L3: Mild broad-based disc bulge. No evidence of neural foraminal stenosis. No central canal stenosis. L3-L4: Mild broad-based disc bulge with a left lateral disc extrusion abutting the left L2 nerve root. No evidence of neural foraminal stenosis. Moderate bilateral facet arthropathy. Bilateral lateral recess narrowing. Mild spinal stenosis. L4-L5: Mild broad-based disc bulge. Moderate bilateral facet arthropathy. Mild spinal stenosis. No evidence of neural foraminal stenosis. L5-S1: Mild broad-based disc bulge eccentric towards the left. Moderate left foraminal narrowing. No right foraminal narrowing. Mild bilateral facet arthropathy. No central canal stenosis. IMPRESSION: 1. At L3-4 there is a mild broad-based disc bulge with a left lateral disc extrusion abutting the left L2 nerve root. Moderate bilateral facet arthropathy. Bilateral lateral recess narrowing. Mild spinal stenosis. 2. At L5-S1 there is a mild broad-based disc bulge eccentric  towards the left. Moderate left foraminal narrowing. Mild bilateral facet arthropathy. Electronically Signed   By: Kathreen Devoid   On: 05/02/2016 10:12    Procedures Procedures (including critical care time)  Medications Ordered in ED Medications  morphine 4 MG/ML injection 4 mg (4 mg Intravenous Given 05/02/16 0834)  ondansetron (ZOFRAN) injection 4 mg (4 mg Intravenous Given 05/02/16 0834)  diazepam (VALIUM) injection 5 mg (5 mg Intravenous Given 05/02/16 0836)  morphine 4 MG/ML injection 4 mg (4 mg Intravenous Given 05/02/16 1051)  dexamethasone (DECADRON) injection 4 mg (4 mg Intravenous Given 05/02/16 1051)     Initial Impression / Assessment and Plan / ED Course  I have reviewed the triage vital signs and the nursing notes.  Pertinent labs & imaging results that were available during my care of the patient were reviewed by me and considered in my medical decision making (see chart for details).  Clinical Course    Becky Winters is a 75 y.o. female here with back pain radiate down L leg. Likely sciatica. Will get MRI to r/o impingement vs disc disease. Will give pain meds, muscle relaxants.   11:31 AM MRI showed L3-4 bulging disc and L5-S1 bulging disc. No canal stenosis. Pain controlled. Given decadron. Will dc home with course of steroids. She is on hydrocodone and received 15 tabs last week (as per controlled substance database). Since it is not helping, will change to percocet. Will have her see her spine doctor.   Final Clinical Impressions(s) / ED Diagnoses   Final diagnoses:  None    New Prescriptions New Prescriptions   No medications on file     Drenda Freeze, MD 05/02/16 1133

## 2016-05-02 NOTE — Discharge Instructions (Signed)
Take prednisone as prescribed.   Continue your muscle relaxant.   Take percocet instead of vicodin for pain.   See your neurosurgeon or Dr. Saintclair Halsted (on call neurosurgeon) to discuss treatment options   Return to ER if you have worse pain, unable to walk, weakness, numbness, trouble urinating

## 2016-05-02 NOTE — ED Notes (Signed)
Pt very sleepy after medication, but easy to arouse and a&ox4.

## 2016-05-02 NOTE — ED Notes (Signed)
Patient transported to MRI 

## 2016-05-02 NOTE — Telephone Encounter (Signed)
Spoke with pt and she states she was in so much pain that she went to ER today and she has been placed on Prednisone 20 mg take 3 tabs for 2 days then 2 tabs for 2 days then 1 tab for 2 days also on Robaxin and Pain medication Pt states she has bulging disc and she is to see Neurologist Pt instructed to take Prednisone as ordered and coumadin as ordered but there is interaction between coumadin and Prednisone so appt made to check her INR on Monday Sept 11th and pt states understanding Her INR today was 2.43

## 2016-05-05 ENCOUNTER — Ambulatory Visit (INDEPENDENT_AMBULATORY_CARE_PROVIDER_SITE_OTHER): Payer: Medicare Other | Admitting: *Deleted

## 2016-05-05 DIAGNOSIS — Z5181 Encounter for therapeutic drug level monitoring: Secondary | ICD-10-CM | POA: Diagnosis not present

## 2016-05-05 DIAGNOSIS — I4891 Unspecified atrial fibrillation: Secondary | ICD-10-CM

## 2016-05-05 DIAGNOSIS — Z953 Presence of xenogenic heart valve: Secondary | ICD-10-CM

## 2016-05-05 DIAGNOSIS — Q231 Congenital insufficiency of aortic valve: Secondary | ICD-10-CM | POA: Diagnosis not present

## 2016-05-05 DIAGNOSIS — I9789 Other postprocedural complications and disorders of the circulatory system, not elsewhere classified: Secondary | ICD-10-CM

## 2016-05-05 DIAGNOSIS — Z954 Presence of other heart-valve replacement: Secondary | ICD-10-CM

## 2016-05-05 LAB — POCT INR: INR: 5.2

## 2016-05-06 ENCOUNTER — Other Ambulatory Visit: Payer: Medicare Other

## 2016-05-12 ENCOUNTER — Ambulatory Visit (INDEPENDENT_AMBULATORY_CARE_PROVIDER_SITE_OTHER): Payer: Medicare Other | Admitting: Pharmacist

## 2016-05-12 DIAGNOSIS — Z5181 Encounter for therapeutic drug level monitoring: Secondary | ICD-10-CM

## 2016-05-12 DIAGNOSIS — I4891 Unspecified atrial fibrillation: Secondary | ICD-10-CM

## 2016-05-12 DIAGNOSIS — I9789 Other postprocedural complications and disorders of the circulatory system, not elsewhere classified: Secondary | ICD-10-CM | POA: Diagnosis not present

## 2016-05-12 DIAGNOSIS — Z953 Presence of xenogenic heart valve: Secondary | ICD-10-CM

## 2016-05-12 DIAGNOSIS — Z954 Presence of other heart-valve replacement: Secondary | ICD-10-CM

## 2016-05-12 DIAGNOSIS — Q231 Congenital insufficiency of aortic valve: Secondary | ICD-10-CM | POA: Diagnosis not present

## 2016-05-12 LAB — POCT INR: INR: 2

## 2016-06-07 DIAGNOSIS — Z23 Encounter for immunization: Secondary | ICD-10-CM | POA: Diagnosis not present

## 2016-06-09 ENCOUNTER — Ambulatory Visit (INDEPENDENT_AMBULATORY_CARE_PROVIDER_SITE_OTHER): Payer: Medicare Other

## 2016-06-09 DIAGNOSIS — Z953 Presence of xenogenic heart valve: Secondary | ICD-10-CM

## 2016-06-09 DIAGNOSIS — I9789 Other postprocedural complications and disorders of the circulatory system, not elsewhere classified: Secondary | ICD-10-CM

## 2016-06-09 DIAGNOSIS — M5126 Other intervertebral disc displacement, lumbar region: Secondary | ICD-10-CM | POA: Diagnosis not present

## 2016-06-09 DIAGNOSIS — Z5181 Encounter for therapeutic drug level monitoring: Secondary | ICD-10-CM | POA: Diagnosis not present

## 2016-06-09 DIAGNOSIS — I4891 Unspecified atrial fibrillation: Secondary | ICD-10-CM

## 2016-06-09 DIAGNOSIS — M5416 Radiculopathy, lumbar region: Secondary | ICD-10-CM | POA: Diagnosis not present

## 2016-06-09 DIAGNOSIS — Q231 Congenital insufficiency of aortic valve: Secondary | ICD-10-CM | POA: Diagnosis not present

## 2016-06-09 DIAGNOSIS — M5136 Other intervertebral disc degeneration, lumbar region: Secondary | ICD-10-CM | POA: Diagnosis not present

## 2016-06-09 DIAGNOSIS — M545 Low back pain: Secondary | ICD-10-CM | POA: Diagnosis not present

## 2016-06-09 LAB — POCT INR: INR: 2.1

## 2016-07-15 ENCOUNTER — Ambulatory Visit (INDEPENDENT_AMBULATORY_CARE_PROVIDER_SITE_OTHER): Payer: Medicare Other | Admitting: *Deleted

## 2016-07-15 DIAGNOSIS — Z953 Presence of xenogenic heart valve: Secondary | ICD-10-CM

## 2016-07-15 DIAGNOSIS — I4891 Unspecified atrial fibrillation: Secondary | ICD-10-CM

## 2016-07-15 DIAGNOSIS — I9789 Other postprocedural complications and disorders of the circulatory system, not elsewhere classified: Secondary | ICD-10-CM

## 2016-07-15 DIAGNOSIS — Z5181 Encounter for therapeutic drug level monitoring: Secondary | ICD-10-CM | POA: Diagnosis not present

## 2016-07-15 DIAGNOSIS — Q231 Congenital insufficiency of aortic valve: Secondary | ICD-10-CM | POA: Diagnosis not present

## 2016-07-15 LAB — POCT INR: INR: 1.9

## 2016-08-12 ENCOUNTER — Ambulatory Visit (INDEPENDENT_AMBULATORY_CARE_PROVIDER_SITE_OTHER): Payer: Medicare Other

## 2016-08-12 DIAGNOSIS — Z953 Presence of xenogenic heart valve: Secondary | ICD-10-CM | POA: Diagnosis not present

## 2016-08-12 DIAGNOSIS — Z5181 Encounter for therapeutic drug level monitoring: Secondary | ICD-10-CM | POA: Diagnosis not present

## 2016-08-12 DIAGNOSIS — I9789 Other postprocedural complications and disorders of the circulatory system, not elsewhere classified: Secondary | ICD-10-CM | POA: Diagnosis not present

## 2016-08-12 DIAGNOSIS — I4891 Unspecified atrial fibrillation: Secondary | ICD-10-CM

## 2016-08-12 DIAGNOSIS — Q231 Congenital insufficiency of aortic valve: Secondary | ICD-10-CM | POA: Diagnosis not present

## 2016-08-12 LAB — POCT INR: INR: 1.9

## 2016-09-09 ENCOUNTER — Ambulatory Visit (INDEPENDENT_AMBULATORY_CARE_PROVIDER_SITE_OTHER): Payer: Medicare Other | Admitting: *Deleted

## 2016-09-09 DIAGNOSIS — Q231 Congenital insufficiency of aortic valve: Secondary | ICD-10-CM

## 2016-09-09 DIAGNOSIS — Z5181 Encounter for therapeutic drug level monitoring: Secondary | ICD-10-CM | POA: Diagnosis not present

## 2016-09-09 DIAGNOSIS — I9789 Other postprocedural complications and disorders of the circulatory system, not elsewhere classified: Secondary | ICD-10-CM | POA: Diagnosis not present

## 2016-09-09 DIAGNOSIS — I4891 Unspecified atrial fibrillation: Secondary | ICD-10-CM

## 2016-09-09 DIAGNOSIS — Z953 Presence of xenogenic heart valve: Secondary | ICD-10-CM

## 2016-09-09 LAB — POCT INR: INR: 2.2

## 2016-09-16 DIAGNOSIS — R921 Mammographic calcification found on diagnostic imaging of breast: Secondary | ICD-10-CM | POA: Diagnosis not present

## 2016-09-29 ENCOUNTER — Other Ambulatory Visit: Payer: Self-pay | Admitting: Cardiology

## 2016-10-07 ENCOUNTER — Ambulatory Visit (INDEPENDENT_AMBULATORY_CARE_PROVIDER_SITE_OTHER): Payer: Medicare Other | Admitting: *Deleted

## 2016-10-07 DIAGNOSIS — Z953 Presence of xenogenic heart valve: Secondary | ICD-10-CM | POA: Diagnosis not present

## 2016-10-07 DIAGNOSIS — Q2381 Bicuspid aortic valve: Secondary | ICD-10-CM

## 2016-10-07 DIAGNOSIS — Z5181 Encounter for therapeutic drug level monitoring: Secondary | ICD-10-CM

## 2016-10-07 DIAGNOSIS — I4891 Unspecified atrial fibrillation: Secondary | ICD-10-CM

## 2016-10-07 DIAGNOSIS — I9789 Other postprocedural complications and disorders of the circulatory system, not elsewhere classified: Secondary | ICD-10-CM

## 2016-10-07 DIAGNOSIS — Q231 Congenital insufficiency of aortic valve: Secondary | ICD-10-CM

## 2016-10-07 LAB — POCT INR: INR: 2.3

## 2016-10-15 DIAGNOSIS — E784 Other hyperlipidemia: Secondary | ICD-10-CM | POA: Diagnosis not present

## 2016-10-15 DIAGNOSIS — E038 Other specified hypothyroidism: Secondary | ICD-10-CM | POA: Diagnosis not present

## 2016-10-15 DIAGNOSIS — E559 Vitamin D deficiency, unspecified: Secondary | ICD-10-CM | POA: Diagnosis not present

## 2016-10-15 DIAGNOSIS — Z0000 Encounter for general adult medical examination without abnormal findings: Secondary | ICD-10-CM | POA: Diagnosis not present

## 2016-10-24 DIAGNOSIS — Z0000 Encounter for general adult medical examination without abnormal findings: Secondary | ICD-10-CM | POA: Diagnosis not present

## 2016-10-24 DIAGNOSIS — E559 Vitamin D deficiency, unspecified: Secondary | ICD-10-CM | POA: Diagnosis not present

## 2016-10-24 DIAGNOSIS — M1990 Unspecified osteoarthritis, unspecified site: Secondary | ICD-10-CM | POA: Diagnosis not present

## 2016-10-24 DIAGNOSIS — Z952 Presence of prosthetic heart valve: Secondary | ICD-10-CM | POA: Diagnosis not present

## 2016-10-24 DIAGNOSIS — Z1389 Encounter for screening for other disorder: Secondary | ICD-10-CM | POA: Diagnosis not present

## 2016-10-24 DIAGNOSIS — K529 Noninfective gastroenteritis and colitis, unspecified: Secondary | ICD-10-CM | POA: Diagnosis not present

## 2016-10-24 DIAGNOSIS — I2510 Atherosclerotic heart disease of native coronary artery without angina pectoris: Secondary | ICD-10-CM | POA: Diagnosis not present

## 2016-10-24 DIAGNOSIS — Z6829 Body mass index (BMI) 29.0-29.9, adult: Secondary | ICD-10-CM | POA: Diagnosis not present

## 2016-10-24 DIAGNOSIS — M5416 Radiculopathy, lumbar region: Secondary | ICD-10-CM | POA: Diagnosis not present

## 2016-10-24 DIAGNOSIS — E038 Other specified hypothyroidism: Secondary | ICD-10-CM | POA: Diagnosis not present

## 2016-10-24 DIAGNOSIS — E784 Other hyperlipidemia: Secondary | ICD-10-CM | POA: Diagnosis not present

## 2016-10-24 DIAGNOSIS — I480 Paroxysmal atrial fibrillation: Secondary | ICD-10-CM | POA: Diagnosis not present

## 2016-11-04 ENCOUNTER — Ambulatory Visit (INDEPENDENT_AMBULATORY_CARE_PROVIDER_SITE_OTHER): Payer: Medicare Other

## 2016-11-04 DIAGNOSIS — Z953 Presence of xenogenic heart valve: Secondary | ICD-10-CM | POA: Diagnosis not present

## 2016-11-04 DIAGNOSIS — I4891 Unspecified atrial fibrillation: Secondary | ICD-10-CM | POA: Diagnosis not present

## 2016-11-04 DIAGNOSIS — Q231 Congenital insufficiency of aortic valve: Secondary | ICD-10-CM | POA: Diagnosis not present

## 2016-11-04 DIAGNOSIS — Z5181 Encounter for therapeutic drug level monitoring: Secondary | ICD-10-CM

## 2016-11-04 DIAGNOSIS — I9789 Other postprocedural complications and disorders of the circulatory system, not elsewhere classified: Secondary | ICD-10-CM

## 2016-11-04 LAB — POCT INR: INR: 2.7

## 2016-12-01 DIAGNOSIS — H52203 Unspecified astigmatism, bilateral: Secondary | ICD-10-CM | POA: Diagnosis not present

## 2016-12-01 DIAGNOSIS — H04123 Dry eye syndrome of bilateral lacrimal glands: Secondary | ICD-10-CM | POA: Diagnosis not present

## 2016-12-01 DIAGNOSIS — H2513 Age-related nuclear cataract, bilateral: Secondary | ICD-10-CM | POA: Diagnosis not present

## 2016-12-10 IMAGING — MR MR LUMBAR SPINE W/O CM
4 of 5 series · 18 of 48 positions shown · non-contrast
Comparison: None.

CLINICAL DATA: Pt injured her back about 3 weeks ago cleaning
house. Went to Dr on [DATE] and received steroid shot, prednisone pack
and muscle relaxers.

EXAM:
MRI LUMBAR SPINE WITHOUT CONTRAST
TECHNIQUE: Multiplanar, multisequence MR imaging of the lumbar spine was
performed. No intravenous contrast was administered.

[Series 3: T2 · sagittal · 4.0mm · 0.55mm/px · 6 of 13 slices shown (1 of 2)]
[im 1/13]
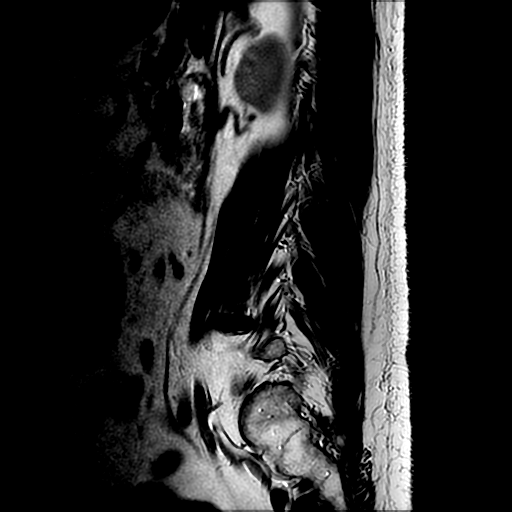
[im 3/13]
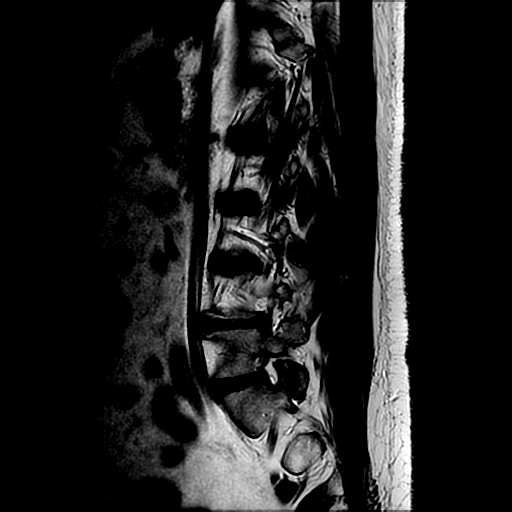
[im 5/13]
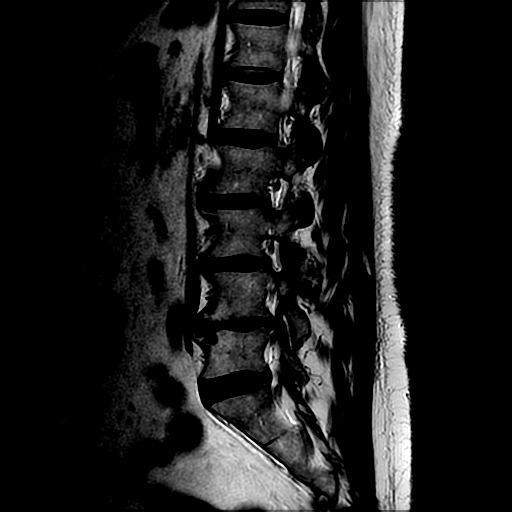
[im 8/13]
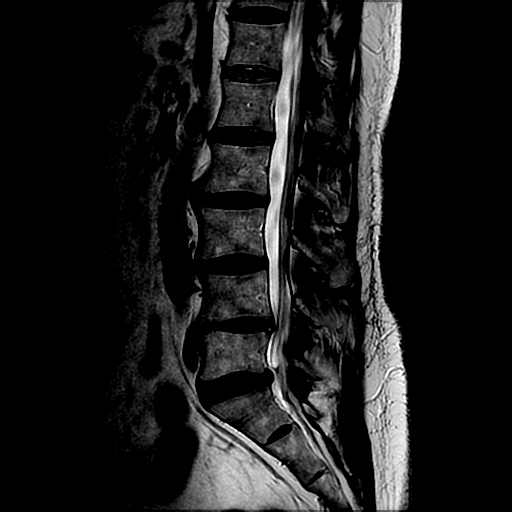
[im 10/13]
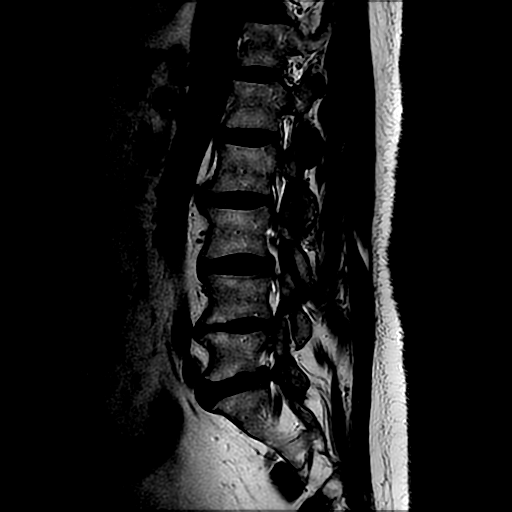
[im 13/13]
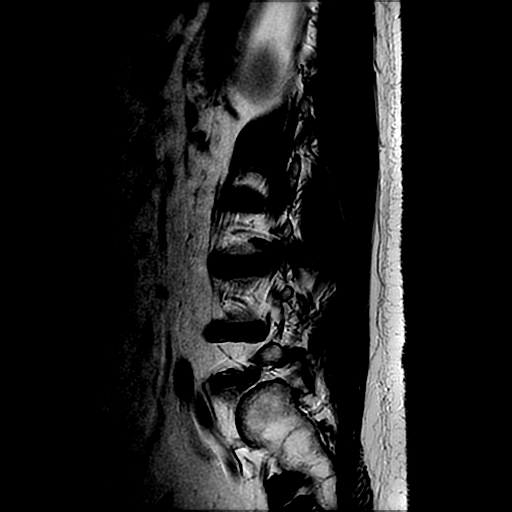

[Series 4: T1 · sagittal · 4.0mm · 0.55mm/px · 3 of 13 slices shown (1 of 2)]
[im 3/13]
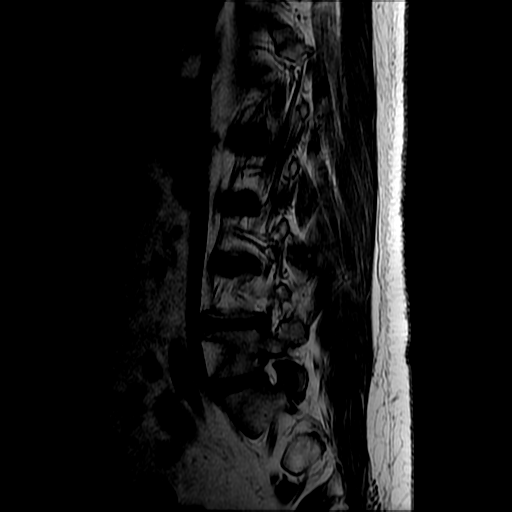
[im 8/13]
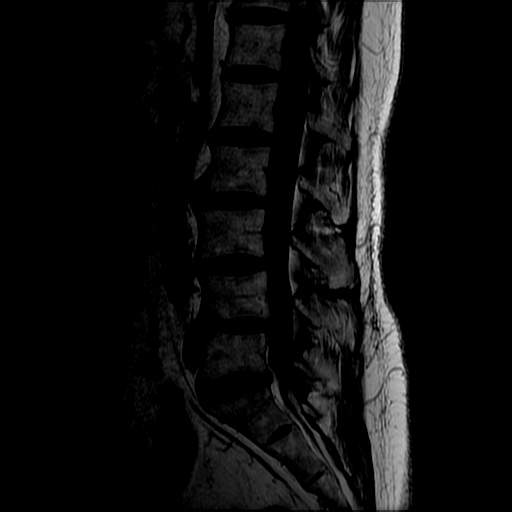
[im 13/13]
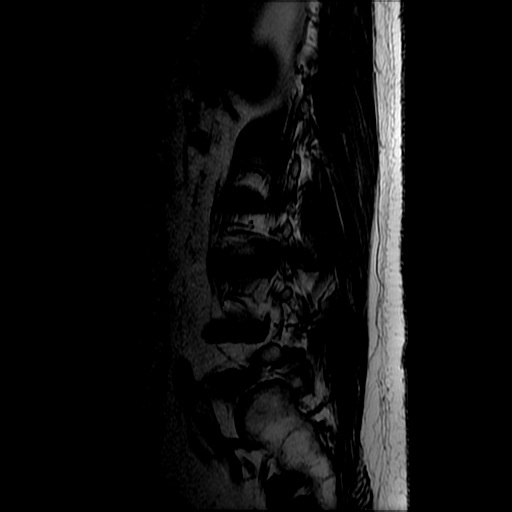

[Series 6: T2 · axial · 4.0mm · 0.39mm/px · z∈[-52,+104]mm · 6 of 34 slices shown (2 of 2)]
[im 1/34]
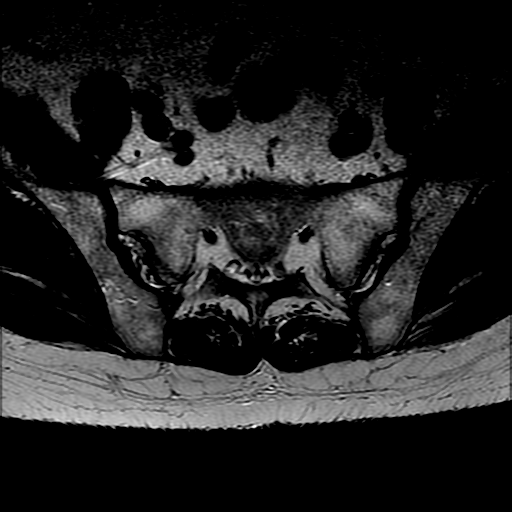
[im 5/34]
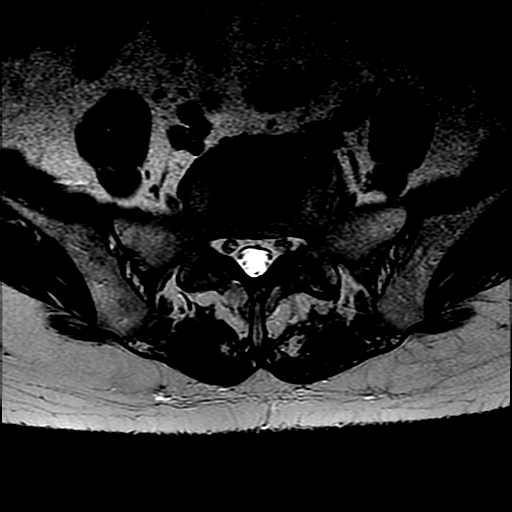
[im 10/34]
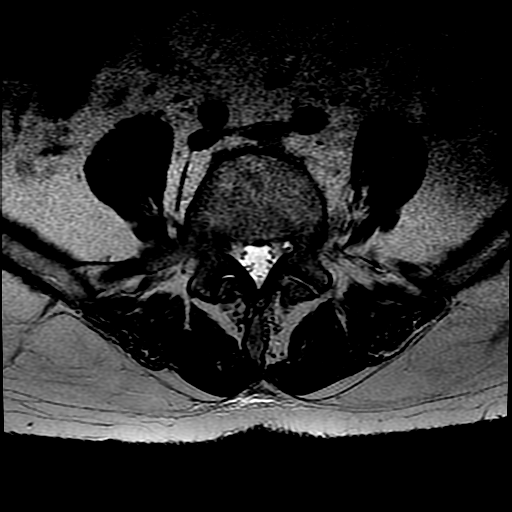
[im 15/34]
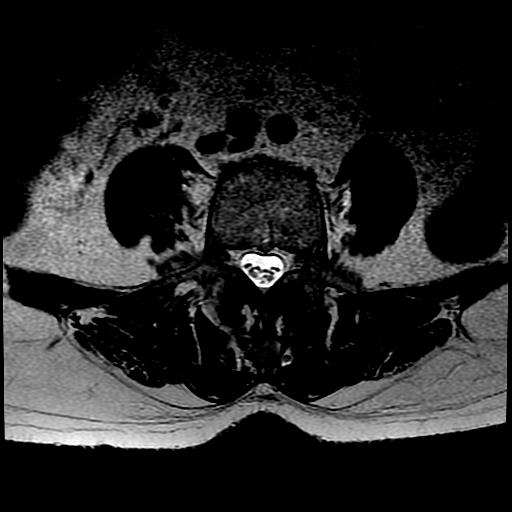
[im 17/34]
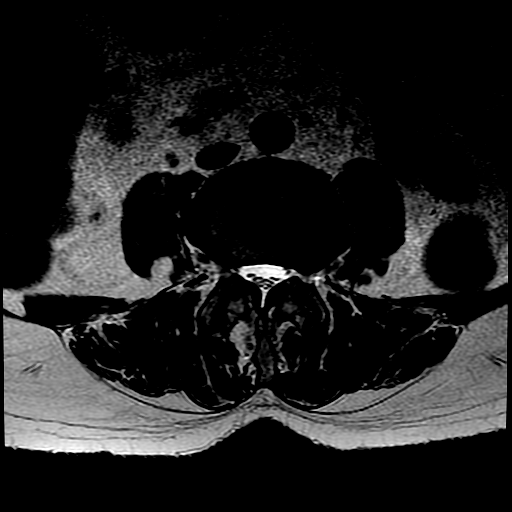
[im 29/34]
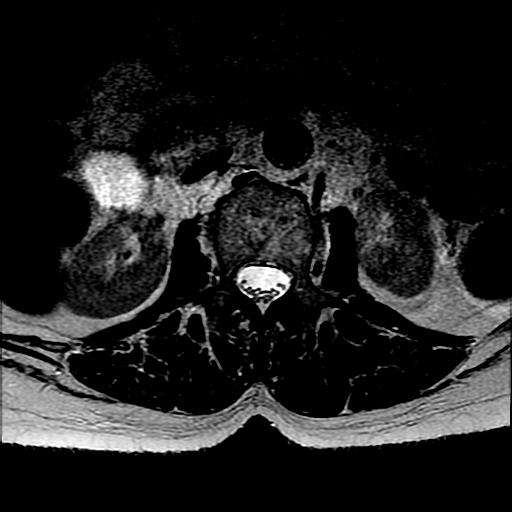

[Series 7: T1 · axial · 4.0mm · 0.39mm/px · z∈[-33,+104]mm · 3 of 34 slices shown (2 of 2)]
[im 5/34]
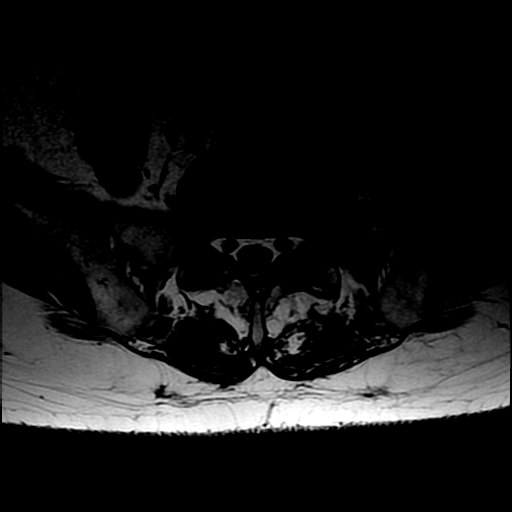
[im 17/34]
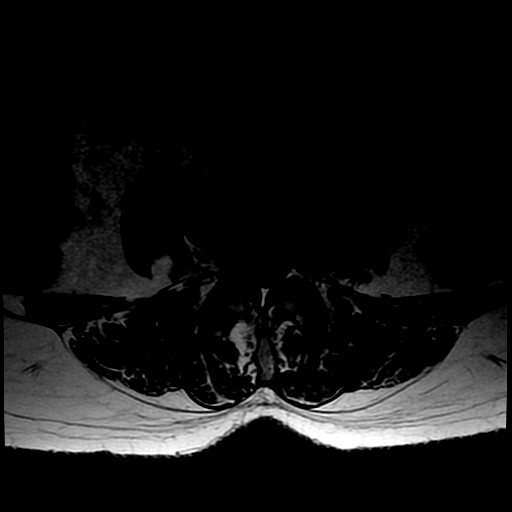
[im 29/34]
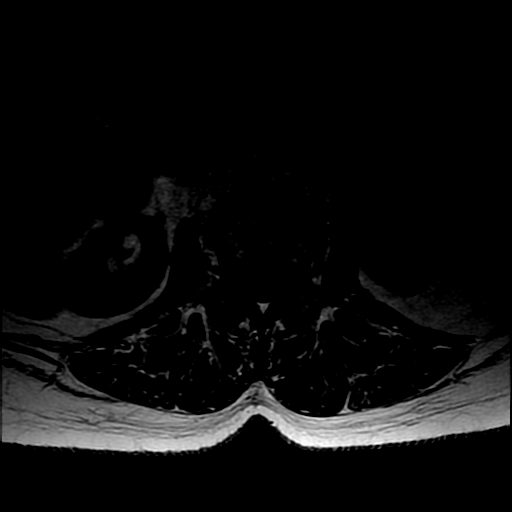

[18 of 48 positions shown; findings below may reference images not displayed]

FINDINGS: Segmentation:  Standard.

Alignment:  Physiologic.

Vertebrae:  No fracture, evidence of discitis, or bone lesion.

Conus medullaris: Extends to the L1 level and appears normal.

Paraspinal and other soft tissues: No paraspinal abnormality.

Disc levels:

Disc spaces: Degenerative disc disease with disc height loss at
L4-5.

T12-L1: No significant disc bulge. No evidence of neural foraminal
stenosis. No central canal stenosis.

L1-L2: Mild broad-based disc bulge. No evidence of neural foraminal
stenosis. No central canal stenosis.

L2-L3: Mild broad-based disc bulge. No evidence of neural foraminal
stenosis. No central canal stenosis.

L3-L4: Mild broad-based disc bulge with a left lateral disc
extrusion abutting the left L2 nerve root. No evidence of neural
foraminal stenosis. Moderate bilateral facet arthropathy. Bilateral
lateral recess narrowing. Mild spinal stenosis.

L4-L5: Mild broad-based disc bulge. Moderate bilateral facet
arthropathy. Mild spinal stenosis. No evidence of neural foraminal
stenosis.

L5-S1: Mild broad-based disc bulge eccentric towards the left.
Moderate left foraminal narrowing. No right foraminal narrowing.
Mild bilateral facet arthropathy. No central canal stenosis.
IMPRESSION: 1. At L3-4 there is a mild broad-based disc bulge with a left
lateral disc extrusion abutting the left L2 nerve root. Moderate
bilateral facet arthropathy. Bilateral lateral recess narrowing.
Mild spinal stenosis.
2. At L5-S1 there is a mild broad-based disc bulge eccentric towards
the left. Moderate left foraminal narrowing. Mild bilateral facet
arthropathy.

## 2016-12-16 ENCOUNTER — Ambulatory Visit (INDEPENDENT_AMBULATORY_CARE_PROVIDER_SITE_OTHER): Payer: Medicare Other | Admitting: Pharmacist

## 2016-12-16 DIAGNOSIS — Q231 Congenital insufficiency of aortic valve: Secondary | ICD-10-CM

## 2016-12-16 DIAGNOSIS — Z5181 Encounter for therapeutic drug level monitoring: Secondary | ICD-10-CM | POA: Diagnosis not present

## 2016-12-16 DIAGNOSIS — I4891 Unspecified atrial fibrillation: Secondary | ICD-10-CM | POA: Diagnosis not present

## 2016-12-16 DIAGNOSIS — I9789 Other postprocedural complications and disorders of the circulatory system, not elsewhere classified: Secondary | ICD-10-CM

## 2016-12-16 DIAGNOSIS — Z953 Presence of xenogenic heart valve: Secondary | ICD-10-CM

## 2016-12-16 LAB — POCT INR: INR: 3.3

## 2017-01-13 ENCOUNTER — Ambulatory Visit (INDEPENDENT_AMBULATORY_CARE_PROVIDER_SITE_OTHER): Payer: Medicare Other

## 2017-01-13 DIAGNOSIS — Z5181 Encounter for therapeutic drug level monitoring: Secondary | ICD-10-CM | POA: Diagnosis not present

## 2017-01-13 DIAGNOSIS — I4891 Unspecified atrial fibrillation: Secondary | ICD-10-CM

## 2017-01-13 DIAGNOSIS — Q231 Congenital insufficiency of aortic valve: Secondary | ICD-10-CM

## 2017-01-13 DIAGNOSIS — Z953 Presence of xenogenic heart valve: Secondary | ICD-10-CM | POA: Diagnosis not present

## 2017-01-13 DIAGNOSIS — I9789 Other postprocedural complications and disorders of the circulatory system, not elsewhere classified: Secondary | ICD-10-CM | POA: Diagnosis not present

## 2017-01-13 LAB — POCT INR: INR: 2.5

## 2017-02-10 ENCOUNTER — Ambulatory Visit (INDEPENDENT_AMBULATORY_CARE_PROVIDER_SITE_OTHER): Payer: Medicare Other | Admitting: Pharmacist

## 2017-02-10 DIAGNOSIS — Z953 Presence of xenogenic heart valve: Secondary | ICD-10-CM | POA: Diagnosis not present

## 2017-02-10 DIAGNOSIS — Q231 Congenital insufficiency of aortic valve: Secondary | ICD-10-CM | POA: Diagnosis not present

## 2017-02-10 DIAGNOSIS — I9789 Other postprocedural complications and disorders of the circulatory system, not elsewhere classified: Secondary | ICD-10-CM

## 2017-02-10 DIAGNOSIS — I4891 Unspecified atrial fibrillation: Secondary | ICD-10-CM | POA: Diagnosis not present

## 2017-02-10 DIAGNOSIS — Z5181 Encounter for therapeutic drug level monitoring: Secondary | ICD-10-CM | POA: Diagnosis not present

## 2017-02-10 DIAGNOSIS — Q2381 Bicuspid aortic valve: Secondary | ICD-10-CM

## 2017-02-10 LAB — POCT INR: INR: 2.4

## 2017-02-26 DIAGNOSIS — L910 Hypertrophic scar: Secondary | ICD-10-CM | POA: Diagnosis not present

## 2017-02-26 DIAGNOSIS — L720 Epidermal cyst: Secondary | ICD-10-CM | POA: Diagnosis not present

## 2017-02-26 DIAGNOSIS — L821 Other seborrheic keratosis: Secondary | ICD-10-CM | POA: Diagnosis not present

## 2017-02-26 DIAGNOSIS — L570 Actinic keratosis: Secondary | ICD-10-CM | POA: Diagnosis not present

## 2017-02-26 DIAGNOSIS — D1800 Hemangioma unspecified site: Secondary | ICD-10-CM | POA: Diagnosis not present

## 2017-02-26 DIAGNOSIS — D1801 Hemangioma of skin and subcutaneous tissue: Secondary | ICD-10-CM | POA: Diagnosis not present

## 2017-02-26 DIAGNOSIS — L814 Other melanin hyperpigmentation: Secondary | ICD-10-CM | POA: Diagnosis not present

## 2017-02-26 DIAGNOSIS — D485 Neoplasm of uncertain behavior of skin: Secondary | ICD-10-CM | POA: Diagnosis not present

## 2017-03-13 ENCOUNTER — Ambulatory Visit (INDEPENDENT_AMBULATORY_CARE_PROVIDER_SITE_OTHER): Payer: Medicare Other | Admitting: Internal Medicine

## 2017-03-13 ENCOUNTER — Encounter: Payer: Self-pay | Admitting: Internal Medicine

## 2017-03-13 ENCOUNTER — Ambulatory Visit (INDEPENDENT_AMBULATORY_CARE_PROVIDER_SITE_OTHER): Payer: Medicare Other | Admitting: *Deleted

## 2017-03-13 VITALS — BP 104/60 | HR 57 | Ht 65.0 in | Wt 169.0 lb

## 2017-03-13 DIAGNOSIS — Z5181 Encounter for therapeutic drug level monitoring: Secondary | ICD-10-CM

## 2017-03-13 DIAGNOSIS — R0602 Shortness of breath: Secondary | ICD-10-CM | POA: Diagnosis not present

## 2017-03-13 DIAGNOSIS — I2510 Atherosclerotic heart disease of native coronary artery without angina pectoris: Secondary | ICD-10-CM | POA: Diagnosis not present

## 2017-03-13 DIAGNOSIS — I9789 Other postprocedural complications and disorders of the circulatory system, not elsewhere classified: Secondary | ICD-10-CM

## 2017-03-13 DIAGNOSIS — Z953 Presence of xenogenic heart valve: Secondary | ICD-10-CM

## 2017-03-13 DIAGNOSIS — I483 Typical atrial flutter: Secondary | ICD-10-CM | POA: Diagnosis not present

## 2017-03-13 DIAGNOSIS — I350 Nonrheumatic aortic (valve) stenosis: Secondary | ICD-10-CM

## 2017-03-13 DIAGNOSIS — I4891 Unspecified atrial fibrillation: Secondary | ICD-10-CM

## 2017-03-13 DIAGNOSIS — E785 Hyperlipidemia, unspecified: Secondary | ICD-10-CM | POA: Diagnosis not present

## 2017-03-13 DIAGNOSIS — Q231 Congenital insufficiency of aortic valve: Secondary | ICD-10-CM

## 2017-03-13 DIAGNOSIS — Q2381 Bicuspid aortic valve: Secondary | ICD-10-CM

## 2017-03-13 LAB — POCT INR: INR: 2.3

## 2017-03-13 NOTE — Progress Notes (Signed)
Follow-up Outpatient Visit Date: 03/13/2017  Primary Care Provider: Reynold Bowen, MD [place] [address]  Chief Complaint: Follow-up bioprosthetic aortic valve  HPI:  Becky Winters is a 76 y.o. year-old female with history of bicuspid aortic valve status post bioprosthetic aortic valve replacement complicated, paroxysmal atrial fibrillation/flutter, TIA, and hyperlipidemia, who presents for follow-up of aortic valve disease. She was previously followed in our office by Dr. Aundra Dubin, having last been seen in 02/2016. At her last visit, she was doing relatively well, complaining only of stable exertional dyspnea and a chronic cough. She remains on chronic anticoagulation with warfarin, given her history PAF, though she would like to come of anticoagulation if possible.  Today, she reports feeling about the same as at her last visit. She has stable dyspnea on exertion as well as occasional lightheadedness. She notes that these symptoms were present even before for AVR and have not changed significantly. She reports that her blood pressure is sometimes low in the mornings (98/59 this morning). She reports occasional left upper chest pain (just below the clavicle) which she is unable to characterize further. It is non-exertional and most often happens when she is lying in bed. She has occasional brief palpitations that last only a second or two. She denies orthopnea, PND, but has occasional mild leg edema.  Becky Winters is most concerned about chronic joint pains that affect her entire body. She has previously been told to avoid aspirin and NSAIDs and has not had any relief with acetaminophen. She would like to try tumeric but has heart that it could interact with her medications, particularly warfarin.  --------------------------------------------------------------------------------------------------  Cardiovascular History & Procedures: Cardiovascular Problems:  Bicuspid aortic valve  status post bioprosthetic AVR  Paroxysmal atrial fibrillation and flutter (intolerant of amiodarone)  TIA  Nonobstructive coronary artery disease  Risk Factors:  Nonobstructive CAD, TIA, hyperlipidemia, and age greater than 56  Cath/PCI:  LHC/RHC (05/25/14): LMCA normal. LAD normal. Relatively small LCx with 40% proximal stenosis. Dominant RCA without disease. RA 4, RV 26/4, PA 23/80 (14), PCWP 7. AO sat 97%, PA sat 81%. Fick CO 8.0, Fick CI 4.4.  CV Surgery:  Aortic valve replacement (06/07/14, Dr. Roxy Manns): [facility] Ease Pericardial Tissue Valve (size 23 mm, model # 3300TFX, serial # [id number])  EP Procedures and Devices:  30-day event monitor (08/02/15): Sinus rhythm without significant arrhythmias.  Non-Invasive Evaluation(s):  TTE (08/15/15): Normal LV size and wall thickness with LVEF of 55-60%. Normal wall motion. Grade 2 diastolic dysfunction. Bioprosthetic aortic valve present with mean gradient of 15 mmHg. Trace MR and TR. Normal RV size and function.  Recent CV Pertinent Labs: Lab Results  Component Value Date   CHOL 111 (L) 03/12/2016   HDL 36 (L) 03/12/2016   LDLCALC 48 03/12/2016   TRIG 135 03/12/2016   CHOLHDL 3.1 03/12/2016   INR 2.3 03/13/2017   INR 2.43 05/02/2016   BNP 84.4 09/14/2015   K 4.2 05/02/2016   MG 2.0 06/08/2014   BUN 23 (H) 05/02/2016   CREATININE 1.01 (H) 05/02/2016   CREATININE 1.17 (H) 03/12/2016    Past medical and surgical history were reviewed and updated in EPIC.  Current Meds  Medication Sig  . atorvastatin (LIPITOR) 40 MG tablet Take 20 mg by mouth at bedtime.   Marland Kitchen azithromycin (ZITHROMAX) 250 MG tablet Take 2 tablets 30-60 minutes before dental procedure (Patient taking differently: Take 500 mg by mouth See admin instructions. Take 2 tablets (500 mg) by mouth 30-60 minutes  before dental procedure)  . b complex vitamins tablet Take 1 tablet by mouth daily.   . beta carotene w/minerals (OCUVITE) tablet Take 1 tablet by mouth 2  (two) times daily.   . Biotin 5000 MCG TABS Take 5,000 mcg by mouth 2 (two) times daily.   . cholecalciferol (VITAMIN D) 1000 UNITS tablet Take 4,000 Units by mouth daily.   . Cinnamon 500 MG TABS Take 1 tablet by mouth 2 (two) times daily.   . Coenzyme Q10 (COQ-10) 100 MG CAPS Take 100 mg by mouth 2 (two) times daily.   . DiphenhydrAMINE HCl, Sleep, 50 MG CAPS Take 50 mg by mouth at bedtime.   . folic acid (FOLVITE) 884 MCG tablet Take 800 mcg by mouth daily.   Marland Kitchen JANTOVEN 2.5 MG tablet TAKE AS DIRECTED BY COUMADIN CLINIC  . levothyroxine (SYNTHROID, LEVOTHROID) 100 MCG tablet Take 100 mcg by mouth daily before breakfast.   . Melatonin 3 MG TABS Take 3 mg by mouth at bedtime.   . methocarbamol (ROBAXIN) 500 MG tablet Take 500 mg by mouth 3 (three) times daily as needed for muscle spasms.   . nitrofurantoin (MACRODANTIN) 100 MG capsule Take 100 mg by mouth at bedtime. UTI preventative  . OLIVE LEAF EXTRACT PO Take 500 mg by mouth 2 (two) times daily.   . Omega-3 Fatty Acids (FISH OIL PO) Take 1,400 mg by mouth 2 (two) times daily.    Allergies: Amiodarone; Clindamycin/lincomycin; Asacol [mesalamine]; Codeine; Pentasa [mesalamine er]; Sulfa antibiotics; and Amoxicillin  Social History   Social History  . Marital status: Married    Spouse name: N/A  . Number of children: N/A  . Years of education: N/A   Occupational History  . Not on file.   Social History Main Topics  . Smoking status: Never Smoker  . Smokeless tobacco: Never Used  . Alcohol use No  . Drug use: No  . Sexual activity: Not on file   Other Topics Concern  . Not on file   Social History Narrative  . No narrative on file    Family History  Problem Relation Age of Onset  . CAD Father 21  . Heart attack Father   . Heart attack Paternal Grandfather   . Stroke Paternal Grandmother   . CVA Unknown   . Hypertension Neg Hx     Review of Systems: A 12-system review of systems was performed and was negative  except as noted in the HPI.  --------------------------------------------------------------------------------------------------  Physical Exam: BP 104/60   Pulse (!) 57   Ht 5\' 5"  (1.651 m)   Wt 169 lb (76.7 kg)   SpO2 98%   BMI 28.12 kg/m   General:  Well-developed, well-nourished woman, seated comfortably in the exam room. She is accompanied by her husband. HEENT: No conjunctival pallor or scleral icterus. Moist mucous membranes.  OP clear. Neck: Supple without lymphadenopathy, thyromegaly, JVD, or HJR. No carotid bruit. Lungs: Normal work of breathing. Clear to auscultation bilaterally without wheezes or crackles. Heart: Regular rate and rhythm with normal S1/S2. 2/6 crescendo-decrescendo systolic murmur noted at the RUSB. No rubs or gallops. Non-displaced PMI. Abd: Bowel sounds present. Soft, NT/ND without hepatosplenomegaly Ext: Trace ankle edema bilaterally. Radial, PT, and DP pulses are 2+ bilaterally. Skin: Warm and dry without rash.  EKG:  Sinus bradycardia (HR 57 bpm) with occasional PACs. Otherwise, no significant abnormalities.  Lab Results  Component Value Date   WBC 8.7 05/02/2016   HGB 15.3 (H) 05/02/2016   HCT  45.0 05/02/2016   MCV 86.2 05/02/2016   PLT 270 05/02/2016    Lab Results  Component Value Date   NA 138 05/02/2016   K 4.2 05/02/2016   CL 108 05/02/2016   CO2 22 05/02/2016   BUN 23 (H) 05/02/2016   CREATININE 1.01 (H) 05/02/2016   GLUCOSE 101 (H) 05/02/2016   ALT 19 05/02/2016    Lab Results  Component Value Date   CHOL 111 (L) 03/12/2016   HDL 36 (L) 03/12/2016   LDLCALC 48 03/12/2016   TRIG 135 03/12/2016   CHOLHDL 3.1 03/12/2016   --------------------------------------------------------------------------------------------------  ASSESSMENT AND PLAN: Bicuspid aortic valve with aortic stenosis status post bioprosthetic aortic valve replacement Patient has stable dyspnea on exertion, though she is concerned about valve function given  that she did not notice significant change in her symptoms following surgery. Trace ankle edema is noted on exam; otherwise Becky Winters appears euvolemic. We will obtain a transthoracic echocardiogram for further assessment.  Paroxysmal atrial fibrillation and flutter Rare palpitations but otherwise no symptoms to suggest recurrence of a-fib. EKG today shows sinus bradycardia with PACs. Becky Winters would like to come off warfarin, though given her CHADSVASC score of at least 5, I have recommended indefinite anticoagulation. We spoke at length regarding use of tumeric. There are reports of anticoagulant and antiplatelet activity with tumeric, which could increase the risk for bleeding with ongoing anticoagulation. I am skeptical that tumeric alone will significantly improve Becky Winters arthralgias. I will discuss this further with the anticoagulation team and contact Becky Winters by phone with our final recommendations. In the meantime, I recommend continued warfarin use. We could consider switching to a NOAC in the future, given non-valvular a-fib/flutter and bioprosthetic aortic valve.  Coronary artery disease Moderate LCx stenosis noted on pre-AVR cath. No symptoms to suggest worsening coronary insufficiency. I doubt that her atypical chest pain is due to CAD. We will continue with medical therapy.  Hyperlipidemia LDL last year was 37. Continue atorvastatin.  Follow-up: Return to clinic in 3 months.  Nelva Bush, MD 03/13/2017 8:40 AM

## 2017-03-13 NOTE — Patient Instructions (Signed)
Medication Instructions:  Your physician recommends that you continue on your current medications as directed. Please refer to the Current Medication list given to you today.   Labwork: None   Testing/Procedures: Your physician has requested that you have an echocardiogram. Echocardiography is a painless test that uses sound waves to create images of your heart. It provides your doctor with information about the size and shape of your heart and how well your heart's chambers and valves are working. This procedure takes approximately one hour. There are no restrictions for this procedure.    Follow-Up: Your physician recommends that you schedule a follow-up appointment in: 3 months with Dr End.   Any Other Special Instructions Will Be Listed Below (If Applicable).  Dr End will talk with the pharmacist about your medication, then give you a call.   If you need a refill on your cardiac medications before your next appointment, please call your pharmacy.

## 2017-03-14 ENCOUNTER — Encounter: Payer: Self-pay | Admitting: Internal Medicine

## 2017-03-16 ENCOUNTER — Telehealth: Payer: Self-pay | Admitting: Internal Medicine

## 2017-03-16 NOTE — Telephone Encounter (Signed)
I spoke with the patient regarding trying tumeric for her arthralgias. I discussed adding this to warfarin with the anticoagulation clinic. Tumeric has both mild anticoagulant and antiplatelet effects that could increase the risk for bleeding, though the degree of bleeding is likely low. We will continue her current medications and have her take tumeric for a month to see if it improves her pain. In the future, we will readdress switching warfarin to a NOAC, as Ms. Haworth does not like the dietary restrictions associated with warfarin.  Nelva Bush, MD [facility] Pager: [phone]

## 2017-03-19 ENCOUNTER — Other Ambulatory Visit: Payer: Self-pay

## 2017-03-19 ENCOUNTER — Ambulatory Visit (HOSPITAL_COMMUNITY): Payer: Medicare Other | Attending: Cardiology

## 2017-03-19 DIAGNOSIS — I083 Combined rheumatic disorders of mitral, aortic and tricuspid valves: Secondary | ICD-10-CM | POA: Diagnosis not present

## 2017-03-19 DIAGNOSIS — Q2381 Bicuspid aortic valve: Secondary | ICD-10-CM

## 2017-03-19 DIAGNOSIS — Q231 Congenital insufficiency of aortic valve: Secondary | ICD-10-CM

## 2017-03-19 DIAGNOSIS — R0602 Shortness of breath: Secondary | ICD-10-CM

## 2017-04-24 ENCOUNTER — Ambulatory Visit (INDEPENDENT_AMBULATORY_CARE_PROVIDER_SITE_OTHER): Payer: Medicare Other | Admitting: *Deleted

## 2017-04-24 DIAGNOSIS — I9789 Other postprocedural complications and disorders of the circulatory system, not elsewhere classified: Secondary | ICD-10-CM

## 2017-04-24 DIAGNOSIS — Q231 Congenital insufficiency of aortic valve: Secondary | ICD-10-CM

## 2017-04-24 DIAGNOSIS — Z953 Presence of xenogenic heart valve: Secondary | ICD-10-CM

## 2017-04-24 DIAGNOSIS — Z5181 Encounter for therapeutic drug level monitoring: Secondary | ICD-10-CM | POA: Diagnosis not present

## 2017-04-24 DIAGNOSIS — I4891 Unspecified atrial fibrillation: Secondary | ICD-10-CM

## 2017-04-24 DIAGNOSIS — I2510 Atherosclerotic heart disease of native coronary artery without angina pectoris: Secondary | ICD-10-CM

## 2017-04-24 LAB — POCT INR: INR: 2.2

## 2017-05-02 ENCOUNTER — Other Ambulatory Visit: Payer: Self-pay | Admitting: Cardiology

## 2017-05-05 ENCOUNTER — Other Ambulatory Visit: Payer: Self-pay | Admitting: *Deleted

## 2017-05-05 DIAGNOSIS — K222 Esophageal obstruction: Secondary | ICD-10-CM | POA: Diagnosis not present

## 2017-05-05 DIAGNOSIS — Z1389 Encounter for screening for other disorder: Secondary | ICD-10-CM | POA: Diagnosis not present

## 2017-05-05 DIAGNOSIS — I779 Disorder of arteries and arterioles, unspecified: Secondary | ICD-10-CM | POA: Diagnosis not present

## 2017-05-05 DIAGNOSIS — E038 Other specified hypothyroidism: Secondary | ICD-10-CM | POA: Diagnosis not present

## 2017-05-05 DIAGNOSIS — E559 Vitamin D deficiency, unspecified: Secondary | ICD-10-CM | POA: Diagnosis not present

## 2017-05-05 DIAGNOSIS — Z952 Presence of prosthetic heart valve: Secondary | ICD-10-CM | POA: Diagnosis not present

## 2017-05-05 DIAGNOSIS — I480 Paroxysmal atrial fibrillation: Secondary | ICD-10-CM | POA: Diagnosis not present

## 2017-05-05 DIAGNOSIS — I2510 Atherosclerotic heart disease of native coronary artery without angina pectoris: Secondary | ICD-10-CM | POA: Diagnosis not present

## 2017-05-05 DIAGNOSIS — Z6828 Body mass index (BMI) 28.0-28.9, adult: Secondary | ICD-10-CM | POA: Diagnosis not present

## 2017-05-05 DIAGNOSIS — R0609 Other forms of dyspnea: Secondary | ICD-10-CM | POA: Diagnosis not present

## 2017-05-05 DIAGNOSIS — K5289 Other specified noninfective gastroenteritis and colitis: Secondary | ICD-10-CM | POA: Diagnosis not present

## 2017-05-05 DIAGNOSIS — E784 Other hyperlipidemia: Secondary | ICD-10-CM | POA: Diagnosis not present

## 2017-05-05 NOTE — Telephone Encounter (Signed)
Pt called to see if her Warfarin could be refilled & I refilled RX while on the phone with the pt. She was appreciative advised pt to call back with any other questions& she verbalized understanding.

## 2017-05-22 ENCOUNTER — Ambulatory Visit (INDEPENDENT_AMBULATORY_CARE_PROVIDER_SITE_OTHER): Payer: Medicare Other | Admitting: *Deleted

## 2017-05-22 DIAGNOSIS — Q231 Congenital insufficiency of aortic valve: Secondary | ICD-10-CM

## 2017-05-22 DIAGNOSIS — Q2381 Bicuspid aortic valve: Secondary | ICD-10-CM

## 2017-05-22 DIAGNOSIS — I4891 Unspecified atrial fibrillation: Secondary | ICD-10-CM

## 2017-05-22 DIAGNOSIS — Z5181 Encounter for therapeutic drug level monitoring: Secondary | ICD-10-CM

## 2017-05-22 DIAGNOSIS — I480 Paroxysmal atrial fibrillation: Secondary | ICD-10-CM | POA: Diagnosis not present

## 2017-05-22 DIAGNOSIS — I483 Typical atrial flutter: Secondary | ICD-10-CM

## 2017-05-22 DIAGNOSIS — Z953 Presence of xenogenic heart valve: Secondary | ICD-10-CM

## 2017-05-22 DIAGNOSIS — I9789 Other postprocedural complications and disorders of the circulatory system, not elsewhere classified: Secondary | ICD-10-CM

## 2017-05-22 LAB — POCT INR: INR: 1.9

## 2017-06-15 ENCOUNTER — Ambulatory Visit (INDEPENDENT_AMBULATORY_CARE_PROVIDER_SITE_OTHER): Payer: Medicare Other | Admitting: Internal Medicine

## 2017-06-15 ENCOUNTER — Encounter: Payer: Self-pay | Admitting: Internal Medicine

## 2017-06-15 ENCOUNTER — Ambulatory Visit (INDEPENDENT_AMBULATORY_CARE_PROVIDER_SITE_OTHER): Payer: Medicare Other | Admitting: *Deleted

## 2017-06-15 VITALS — BP 122/74 | HR 71 | Ht 65.0 in | Wt 169.8 lb

## 2017-06-15 DIAGNOSIS — Z5181 Encounter for therapeutic drug level monitoring: Secondary | ICD-10-CM | POA: Diagnosis not present

## 2017-06-15 DIAGNOSIS — Q231 Congenital insufficiency of aortic valve: Secondary | ICD-10-CM

## 2017-06-15 DIAGNOSIS — Z953 Presence of xenogenic heart valve: Secondary | ICD-10-CM

## 2017-06-15 DIAGNOSIS — I483 Typical atrial flutter: Secondary | ICD-10-CM

## 2017-06-15 DIAGNOSIS — G459 Transient cerebral ischemic attack, unspecified: Secondary | ICD-10-CM

## 2017-06-15 DIAGNOSIS — Z8774 Personal history of (corrected) congenital malformations of heart and circulatory system: Secondary | ICD-10-CM | POA: Diagnosis not present

## 2017-06-15 DIAGNOSIS — I9789 Other postprocedural complications and disorders of the circulatory system, not elsewhere classified: Secondary | ICD-10-CM | POA: Diagnosis not present

## 2017-06-15 DIAGNOSIS — I480 Paroxysmal atrial fibrillation: Secondary | ICD-10-CM | POA: Diagnosis not present

## 2017-06-15 DIAGNOSIS — I4891 Unspecified atrial fibrillation: Secondary | ICD-10-CM

## 2017-06-15 DIAGNOSIS — R0609 Other forms of dyspnea: Secondary | ICD-10-CM | POA: Diagnosis not present

## 2017-06-15 DIAGNOSIS — I2510 Atherosclerotic heart disease of native coronary artery without angina pectoris: Secondary | ICD-10-CM | POA: Insufficient documentation

## 2017-06-15 LAB — CBC WITH DIFFERENTIAL/PLATELET
Basophils Absolute: 0 10*3/uL (ref 0.0–0.2)
Basos: 0 %
EOS (ABSOLUTE): 0.1 10*3/uL (ref 0.0–0.4)
EOS: 1 %
HEMATOCRIT: 41.5 % (ref 34.0–46.6)
HEMOGLOBIN: 14.3 g/dL (ref 11.1–15.9)
IMMATURE GRANS (ABS): 0 10*3/uL (ref 0.0–0.1)
IMMATURE GRANULOCYTES: 0 %
LYMPHS ABS: 2.4 10*3/uL (ref 0.7–3.1)
LYMPHS: 25 %
MCH: 29.2 pg (ref 26.6–33.0)
MCHC: 34.5 g/dL (ref 31.5–35.7)
MCV: 85 fL (ref 79–97)
MONOCYTES: 7 %
Monocytes Absolute: 0.6 10*3/uL (ref 0.1–0.9)
NEUTROS PCT: 67 %
Neutrophils Absolute: 6.4 10*3/uL (ref 1.4–7.0)
Platelets: 246 10*3/uL (ref 150–379)
RBC: 4.9 x10E6/uL (ref 3.77–5.28)
RDW: 14.5 % (ref 12.3–15.4)
WBC: 9.6 10*3/uL (ref 3.4–10.8)

## 2017-06-15 LAB — LIPID PANEL
CHOL/HDL RATIO: 2.9 ratio (ref 0.0–4.4)
Cholesterol, Total: 117 mg/dL (ref 100–199)
HDL: 41 mg/dL (ref >39)
LDL CALC: 54 mg/dL (ref 0–99)
TRIGLYCERIDES: 112 mg/dL (ref 0–149)
VLDL CHOLESTEROL CAL: 22 mg/dL (ref 5–40)

## 2017-06-15 LAB — COMPREHENSIVE METABOLIC PANEL
ALBUMIN: 4.3 g/dL (ref 3.5–4.8)
ALT: 13 IU/L (ref 0–32)
AST: 19 IU/L (ref 0–40)
Albumin/Globulin Ratio: 2.3 — ABNORMAL HIGH (ref 1.2–2.2)
Alkaline Phosphatase: 67 IU/L (ref 39–117)
BUN / CREAT RATIO: 20 (ref 12–28)
BUN: 19 mg/dL (ref 8–27)
Bilirubin Total: 0.5 mg/dL (ref 0.0–1.2)
CALCIUM: 9.1 mg/dL (ref 8.7–10.3)
CO2: 21 mmol/L (ref 20–29)
CREATININE: 0.96 mg/dL (ref 0.57–1.00)
Chloride: 103 mmol/L (ref 96–106)
GFR calc Af Amer: 67 mL/min/{1.73_m2} (ref >59)
GFR, EST NON AFRICAN AMERICAN: 58 mL/min/{1.73_m2} — AB (ref >59)
GLOBULIN, TOTAL: 1.9 g/dL (ref 1.5–4.5)
GLUCOSE: 105 mg/dL — AB (ref 65–99)
Potassium: 4.9 mmol/L (ref 3.5–5.2)
SODIUM: 141 mmol/L (ref 134–144)
Total Protein: 6.2 g/dL (ref 6.0–8.5)

## 2017-06-15 LAB — POCT INR: INR: 2.1

## 2017-06-15 MED ORDER — RIVAROXABAN 20 MG PO TABS: 20.0000 mg | ORAL_TABLET | Freq: Every day | ORAL | 1 refills | Status: DC

## 2017-06-15 MED ORDER — AZITHROMYCIN 250 MG PO TABS: 500.0000 mg | ORAL_TABLET | Freq: Every day | ORAL | 1 refills | Status: DC

## 2017-06-15 NOTE — Patient Instructions (Signed)
Medication Instructions:  Stop warfarin(jantoven).  Start Xarelto 20 mg today with your evening meal.  Labwork: Your physician recommends that you have lab work today--lipid profile/CMET/CBCd   Testing/Procedures: None   Follow-Up: Your physician recommends that you schedule a follow-up appointment in: 1 month in CVRR to follow-up on Xarelto.  Your physician wants you to follow-up in: 6 months with Dr End. (April 2019). You will receive a reminder letter in the mail two months in advance. If you don't receive a letter, please call our office to schedule the follow-up appointment.       If you need a refill on your cardiac medications before your next appointment, please call your pharmacy.

## 2017-06-15 NOTE — Progress Notes (Signed)
Follow-up Outpatient Visit Date: 06/15/2017  Primary Care Provider: Reynold Bowen, [place] [address]  Chief Complaint: Shortness of breath  HPI:  Ms. Massman is a 76 y.o. year-old female with history of  bicuspid aortic valve status post bioprosthetic aortic valve replacement complicated, paroxysmal atrial fibrillation/flutter, TIA, and hyperlipidemia, who presents for follow-up of bioprosthetic aortic valve.  I last saw her in July, at which time she reported stable dyspnea on exertion and episodic lightheadedness. We obtained at transthoracic echocardiogram, which showed normal LVEF and aortic valve bioprosthesis function. Ms. Stlouis also wished to start taking tumeric but was concerned about the potential for bleeding with ongoing anticoagulation.  Today, Ms. Murley reports that she feels about the same as at our last visit.  She had an episode of considerable coughing in early September for which she was started on Singulair by Dr. Forde Dandy.  This helped for a day or two, but her cough has now returned.  It is non-productive but sometimes makes her gag.  Dyspnea on exertion is unchanged.  She has not had any chest pain or palpitations.  Intermittent lightheadedness and chronic left ankle edema are stable.  Ms. Zanders is tolerating warfarin and tumeric well without any bleeding.  She is interested in switching to a novel oral anticoagulant. Tumeric has helped with colitis symptoms.  --------------------------------------------------------------------------------------------------  Cardiovascular History & Procedures: Cardiovascular Problems:  Bicuspid aortic valve status post bioprosthetic AVR  Paroxysmal atrial fibrillation and flutter (intolerant of amiodarone)  TIA  Nonobstructive coronary artery disease  Risk Factors:  Nonobstructive CAD, TIA, hyperlipidemia, and age greater than 72  Cath/PCI:  LHC/RHC (05/25/14): LMCA normal. LAD normal. Relatively small  LCx with 40% proximal stenosis. Dominant RCA without disease. RA 4, RV 26/4, PA 23/80 (14), PCWP 7. AO sat 97%, PA sat 81%. Fick CO 8.0, Fick CI 4.4.  CV Surgery:  Aortic valve replacement (06/07/14, Dr. Roxy Manns): [facility] Ease Pericardial Tissue Valve (size 23 mm, model # 3300TFX, serial # [id number])  EP Procedures and Devices:  30-day event monitor (08/02/15): Sinus rhythm without significant arrhythmias.  Non-Invasive Evaluation(s):  TTE (03/20/27): Normal LV size. LVEF 55-60% with normal wall motion and diastolic function. Bioprosthetic aortic valve present with mean gradient of 11 mmHg. Trivial MR. Mild TR and PR. Mildly dilated RV with normal contraction.  TTE (08/15/15): Normal LV size and wall thickness with LVEF of 55-60%. Normal wall motion. Grade 2 diastolic dysfunction. Bioprosthetic aortic valve present with mean gradient of 15 mmHg. Trace MR and TR. Normal RV size and function.  Recent CV Pertinent Labs: Lab Results  Component Value Date   CHOL 111 (L) 03/12/2016   HDL 36 (L) 03/12/2016   LDLCALC 48 03/12/2016   TRIG 135 03/12/2016   CHOLHDL 3.1 03/12/2016   INR 2.1 06/15/2017   INR 2.43 05/02/2016   BNP 84.4 09/14/2015   K 4.2 05/02/2016   MG 2.0 06/08/2014   BUN 23 (H) 05/02/2016   CREATININE 1.01 (H) 05/02/2016   CREATININE 1.17 (H) 03/12/2016    Past medical and surgical history were reviewed and updated in EPIC.  Current Meds  Medication Sig  . ALPRAZolam (XANAX) 0.25 MG tablet Take 1 tablet (0.25 mg total) by mouth daily as needed for sleep.  Marland Kitchen atorvastatin (LIPITOR) 40 MG tablet Take 20 mg by mouth at bedtime.   Marland Kitchen azithromycin (ZITHROMAX) 250 MG tablet Take 2 tablets (500 mg total) by mouth daily. 2 tabs 30-60 minutes before dental procedures  . b  complex vitamins tablet Take 1 tablet by mouth daily.   . beta carotene w/minerals (OCUVITE) tablet Take 1 tablet by mouth 2 (two) times daily.   . Biotin 5000 MCG TABS Take 5,000 mcg by mouth 2 (two) times  daily.   . cholecalciferol (VITAMIN D) 1000 UNITS tablet Take 4,000 Units by mouth daily.   . Cinnamon 500 MG TABS Take 1 tablet by mouth 2 (two) times daily.   . Coenzyme Q10 (COQ-10) 100 MG CAPS Take 100 mg by mouth 2 (two) times daily.   . DiphenhydrAMINE HCl, Sleep, 50 MG CAPS Take 50 mg by mouth at bedtime.   . folic acid (FOLVITE) 026 MCG tablet Take 800 mcg by mouth daily.   Marland Kitchen levothyroxine (SYNTHROID, LEVOTHROID) 100 MCG tablet Take 100 mcg by mouth daily before breakfast.   . Melatonin 3 MG TABS Take 3 mg by mouth at bedtime.   . nitrofurantoin (MACRODANTIN) 100 MG capsule Take 100 mg by mouth at bedtime. UTI preventative  . NON FORMULARY Tumeric 500mg  BID  . OLIVE LEAF EXTRACT PO Take 500 mg by mouth 2 (two) times daily.   . Omega-3 Fatty Acids (FISH OIL PO) Take 1,400 mg by mouth 2 (two) times daily.  . [DISCONTINUED] azithromycin (ZITHROMAX) 250 MG tablet Take 500 mg by mouth daily. 2 tabs 30-60 minutes before dental procedures  . [DISCONTINUED] JANTOVEN 2.5 MG tablet TAKE AS DIRECTED BY COUMADIN CLINIC    Allergies: Amiodarone; Clindamycin/lincomycin; Asacol [mesalamine]; Clindamycin; Codeine; Pentasa [mesalamine er]; Sulfa antibiotics; Amoxicillin; and Sulfamethoxazole  Social History   Social History  . Marital status: Married    Spouse name: N/A  . Number of children: N/A  . Years of education: N/A   Occupational History  . Not on file.   Social History Main Topics  . Smoking status: Never Smoker  . Smokeless tobacco: Never Used  . Alcohol use No  . Drug use: No  . Sexual activity: Not on file   Other Topics Concern  . Not on file   Social History Narrative  . No narrative on file    Family History  Problem Relation Age of Onset  . CAD Father 70  . Heart attack Father   . Heart attack Paternal Grandfather   . Stroke Paternal Grandmother   . CVA Unknown   . Hypertension Neg Hx     Review of Systems: A 12-system review of systems was performed and  was negative except as noted in the HPI.  --------------------------------------------------------------------------------------------------  Physical Exam: BP 122/74   Pulse 71   Ht 5\' 5"  (1.651 m)   Wt 169 lb 12.8 oz (77 kg)   SpO2 99%   BMI 28.26 kg/m   General: Overweight woman, seated comfortably in the exam room. HEENT: No conjunctival pallor or scleral icterus. Moist mucous membranes.  OP clear. Neck: Supple without lymphadenopathy, thyromegaly, JVD, or HJR. No carotid bruit. Lungs: Normal work of breathing. Clear to auscultation bilaterally without wheezes or crackles. Heart: Regular rate and rhythm with 2/6 systolic murmur loudest at the right upper sternal border.  Normal S1 and S2.  No rubs or gallops. Non-displaced PMI. Abd: Bowel sounds present. Soft, NT/ND without hepatosplenomegaly Ext: 1+ left ankle/foot edema. Radial, PT, and DP pulses are 2+ bilaterally. Skin: Warm and dry without rash.  EKG: Normal sinus rhythm with nonspecific ST/T changes.  No significant change from prior tracing.  Lab Results  Component Value Date   WBC 8.7 05/02/2016   HGB 15.3 (H) 05/02/2016  HCT 45.0 05/02/2016   MCV 86.2 05/02/2016   PLT 270 05/02/2016    Lab Results  Component Value Date   NA 138 05/02/2016   K 4.2 05/02/2016   CL 108 05/02/2016   CO2 22 05/02/2016   BUN 23 (H) 05/02/2016   CREATININE 1.01 (H) 05/02/2016   GLUCOSE 101 (H) 05/02/2016   ALT 19 05/02/2016    Lab Results  Component Value Date   CHOL 111 (L) 03/12/2016   HDL 36 (L) 03/12/2016   LDLCALC 48 03/12/2016   TRIG 135 03/12/2016   CHOLHDL 3.1 03/12/2016   --------------------------------------------------------------------------------------------------  ASSESSMENT AND PLAN: Chronic dyspnea on exertion Overall, symptoms are stable.  I suspect that chronic shortness of breath is multifactorial.  Recent echo showed preserved LVEF and normal aortic valve bioprosthesis function.  Continue follow-up  with Dr. Forde Dandy.  Pulmonary referral could be considered if her symptoms persist.  Coronary artery disease without angina Continue secondary prevention.  Most recent LDL in February was 7; I will recheck a lipid panel today.  History of bicuspid aortic valve and bioprosthetic aortic valve replacement Stable symptoms and echo in 02/2017 with normal valve function.  We will continue Ms. Bobby Rumpf' current medication regimen.  She should continue antibiotic prophylaxis with azithromycin prior to any dental procedures, given history of amoxicillin allergy.  Paroxysmal atrial fibrillation No symptoms to suggest recurrent atrial fibrillation.  INR is therapeutic today.  Ms. Melfi is interested in switching to a NOAC.  After discussing multiple medication options, she would like to try rivaroxaban 20 mg daily.  I will have her stop her warfarin and initiate rivaroxaban, as directed by the anticoagulation clinic.  She will follow-up with them in a month to ensure that she is tolerating the medication well.  I will check a CBC and CMP today to ensure normal baseline blood counts and liver/renal function.  Follow-up return to clinic in 6 months.  Nelva Bush, MD 06/15/2017 1:23 PM

## 2017-06-16 ENCOUNTER — Telehealth: Payer: Self-pay | Admitting: Internal Medicine

## 2017-06-16 ENCOUNTER — Telehealth: Payer: Self-pay | Admitting: *Deleted

## 2017-06-16 NOTE — Telephone Encounter (Signed)
Pt states she does not going to take Xarelto because of side effects listed, she did not start Xarelto and has notified CVRR that she is going to continue warfarin.

## 2017-06-16 NOTE — Telephone Encounter (Signed)
Ok. She should continue with warfarin and follow-up with the anticoagulation clinic, as previously arranged.  Nelva Bush, MD [facility] Pager: [phone]

## 2017-06-16 NOTE — Telephone Encounter (Signed)
I will forward to Dr End and CVRR

## 2017-06-16 NOTE — Telephone Encounter (Signed)
NEw Message  Pt c/o medication issue:  1. Name of Medication: Xarelto   2. How are you currently taking this medication (dosage and times per day)? 20mg    3. Are you having a reaction (difficulty breathing--STAT)? no  4. What is your medication issue? Per pt would like to speak with Rn about her not taking this medication. Pt states she does not agree with the side effects. Please call back to discuss

## 2017-06-16 NOTE — Telephone Encounter (Signed)
Pt called to state she is not going to switch to Xarelto. She prefers to stay on Coumadin.

## 2017-06-20 DIAGNOSIS — Z23 Encounter for immunization: Secondary | ICD-10-CM | POA: Diagnosis not present

## 2017-07-28 ENCOUNTER — Ambulatory Visit (INDEPENDENT_AMBULATORY_CARE_PROVIDER_SITE_OTHER): Payer: Medicare Other | Admitting: *Deleted

## 2017-07-28 DIAGNOSIS — I9789 Other postprocedural complications and disorders of the circulatory system, not elsewhere classified: Secondary | ICD-10-CM | POA: Diagnosis not present

## 2017-07-28 DIAGNOSIS — Q231 Congenital insufficiency of aortic valve: Secondary | ICD-10-CM | POA: Diagnosis not present

## 2017-07-28 DIAGNOSIS — Z5181 Encounter for therapeutic drug level monitoring: Secondary | ICD-10-CM

## 2017-07-28 DIAGNOSIS — Z953 Presence of xenogenic heart valve: Secondary | ICD-10-CM

## 2017-07-28 DIAGNOSIS — I4891 Unspecified atrial fibrillation: Secondary | ICD-10-CM | POA: Diagnosis not present

## 2017-07-28 DIAGNOSIS — Q2381 Bicuspid aortic valve: Secondary | ICD-10-CM

## 2017-07-28 LAB — POCT INR: INR: 2.4

## 2017-07-28 NOTE — Patient Instructions (Signed)
Continue on same dosage 1 tablet everyday and 1/2 tablet on Sundays and Thursdays. Recheck in 6 weeks.

## 2017-08-10 ENCOUNTER — Emergency Department (HOSPITAL_COMMUNITY)
Admission: EM | Admit: 2017-08-10 | Discharge: 2017-08-10 | Disposition: A | Discharge status: Home / Self Care | Payer: Medicare Other | Attending: Emergency Medicine | Admitting: Emergency Medicine

## 2017-08-10 ENCOUNTER — Emergency Department (HOSPITAL_COMMUNITY): Payer: Medicare Other

## 2017-08-10 ENCOUNTER — Encounter (HOSPITAL_COMMUNITY): Payer: Self-pay | Admitting: Emergency Medicine

## 2017-08-10 ENCOUNTER — Other Ambulatory Visit: Payer: Self-pay

## 2017-08-10 DIAGNOSIS — R451 Restlessness and agitation: Secondary | ICD-10-CM | POA: Diagnosis not present

## 2017-08-10 DIAGNOSIS — M545 Low back pain: Secondary | ICD-10-CM | POA: Diagnosis not present

## 2017-08-10 DIAGNOSIS — R29818 Other symptoms and signs involving the nervous system: Secondary | ICD-10-CM | POA: Diagnosis not present

## 2017-08-10 DIAGNOSIS — Z79899 Other long term (current) drug therapy: Secondary | ICD-10-CM | POA: Diagnosis not present

## 2017-08-10 DIAGNOSIS — I5032 Chronic diastolic (congestive) heart failure: Secondary | ICD-10-CM | POA: Insufficient documentation

## 2017-08-10 DIAGNOSIS — R479 Unspecified speech disturbances: Secondary | ICD-10-CM | POA: Diagnosis not present

## 2017-08-10 DIAGNOSIS — I110 Hypertensive heart disease with heart failure: Secondary | ICD-10-CM | POA: Insufficient documentation

## 2017-08-10 DIAGNOSIS — R4702 Dysphasia: Secondary | ICD-10-CM | POA: Diagnosis not present

## 2017-08-10 DIAGNOSIS — R9431 Abnormal electrocardiogram [ECG] [EKG]: Secondary | ICD-10-CM | POA: Diagnosis not present

## 2017-08-10 DIAGNOSIS — E039 Hypothyroidism, unspecified: Secondary | ICD-10-CM | POA: Insufficient documentation

## 2017-08-10 LAB — COMPREHENSIVE METABOLIC PANEL
ALT: 15 U/L (ref 14–54)
AST: 27 U/L (ref 15–41)
Albumin: 3.7 g/dL (ref 3.5–5.0)
Alkaline Phosphatase: 70 U/L (ref 38–126)
Anion gap: 14 (ref 5–15)
BUN: 18 mg/dL (ref 6–20)
CHLORIDE: 108 mmol/L (ref 101–111)
CO2: 16 mmol/L — ABNORMAL LOW (ref 22–32)
Calcium: 10 mg/dL (ref 8.9–10.3)
Creatinine, Ser: 1.01 mg/dL — ABNORMAL HIGH (ref 0.44–1.00)
GFR, EST NON AFRICAN AMERICAN: 53 mL/min — AB (ref >60)
Glucose, Bld: 102 mg/dL — ABNORMAL HIGH (ref 65–99)
POTASSIUM: 3.5 mmol/L (ref 3.5–5.1)
Sodium: 138 mmol/L (ref 135–145)
Total Bilirubin: 1.2 mg/dL (ref 0.3–1.2)
Total Protein: 6.9 g/dL (ref 6.5–8.1)

## 2017-08-10 LAB — DIFFERENTIAL
BASOS ABS: 0 10*3/uL (ref 0.0–0.1)
BASOS PCT: 0 %
EOS ABS: 0.1 10*3/uL (ref 0.0–0.7)
Eosinophils Relative: 0 %
Lymphocytes Relative: 22 %
Lymphs Abs: 2.9 10*3/uL (ref 0.7–4.0)
MONOS PCT: 8 %
Monocytes Absolute: 1.1 10*3/uL — ABNORMAL HIGH (ref 0.1–1.0)
Neutro Abs: 9.3 10*3/uL — ABNORMAL HIGH (ref 1.7–7.7)
Neutrophils Relative %: 70 %

## 2017-08-10 LAB — I-STAT CHEM 8, ED
BUN: 20 mg/dL (ref 6–20)
Calcium, Ion: 1.07 mmol/L — ABNORMAL LOW (ref 1.15–1.40)
Chloride: 107 mmol/L (ref 101–111)
Creatinine, Ser: 0.8 mg/dL (ref 0.44–1.00)
Glucose, Bld: 104 mg/dL — ABNORMAL HIGH (ref 65–99)
HEMATOCRIT: 47 % — AB (ref 36.0–46.0)
HEMOGLOBIN: 16 g/dL — AB (ref 12.0–15.0)
POTASSIUM: 3.4 mmol/L — AB (ref 3.5–5.1)
Sodium: 141 mmol/L (ref 135–145)
TCO2: 18 mmol/L — ABNORMAL LOW (ref 22–32)

## 2017-08-10 LAB — CBC
HEMATOCRIT: 45.3 % (ref 36.0–46.0)
Hemoglobin: 15.6 g/dL — ABNORMAL HIGH (ref 12.0–15.0)
MCH: 29.1 pg (ref 26.0–34.0)
MCHC: 34.4 g/dL (ref 30.0–36.0)
MCV: 84.4 fL (ref 78.0–100.0)
PLATELETS: 254 10*3/uL (ref 150–400)
RBC: 5.37 MIL/uL — ABNORMAL HIGH (ref 3.87–5.11)
RDW: 13.9 % (ref 11.5–15.5)
WBC: 13.4 10*3/uL — ABNORMAL HIGH (ref 4.0–10.5)

## 2017-08-10 LAB — PROTIME-INR
INR: 1.75
PROTHROMBIN TIME: 20.2 s — AB (ref 11.4–15.2)

## 2017-08-10 LAB — APTT: APTT: 33 s (ref 24–36)

## 2017-08-10 LAB — I-STAT TROPONIN, ED: TROPONIN I, POC: 0 ng/mL (ref 0.00–0.08)

## 2017-08-10 MED ORDER — METHOCARBAMOL 500 MG PO TABS: 500.0000 mg | ORAL_TABLET | Freq: Two times a day (BID) | ORAL | 0 refills | Status: DC

## 2017-08-10 MED ORDER — ONDANSETRON 4 MG PO TBDP: 4.0000 mg | ORAL_TABLET | Freq: Three times a day (TID) | ORAL | 0 refills | Status: DC | PRN

## 2017-08-10 MED ORDER — ONDANSETRON HCL 4 MG/2ML IJ SOLN
INTRAMUSCULAR | Status: AC
  Filled 2017-08-10: qty 2

## 2017-08-10 MED ORDER — MORPHINE SULFATE (PF) 4 MG/ML IV SOLN
INTRAVENOUS | Status: AC
  Administered 2017-08-10: 4 mg
  Filled 2017-08-10: qty 1

## 2017-08-10 MED ORDER — MORPHINE SULFATE (PF) 4 MG/ML IV SOLN: 4.0000 mg | Freq: Once | INTRAVENOUS | Status: DC

## 2017-08-10 NOTE — ED Notes (Signed)
Pt states, "The last time this happened Dr. Darl Householder had already given me a shot of morphine.  I have got to have something for this pain."  This RN unable to give percocet per triage protocol d/t allergy to codeine. This RN explained this to pt; pt states understanding.

## 2017-08-10 NOTE — ED Notes (Signed)
Per neurologist, Cancel code stroke

## 2017-08-10 NOTE — ED Notes (Signed)
Pt continues to request an MRI. This RN explained to pt twice that EDP makes decision regarding diagnostic work up, EDP will be in shortly to discuss results and further plan of care. Pt verbalized understanding. Will continue to round on pt and update pt and family on POC.

## 2017-08-10 NOTE — ED Notes (Signed)
This RN alerted by pt's spouse that pt newly AMS, spouse tearful, stated, "She doesn't know who I am." This RN assessed pt. Pt seen to be using BUE equally and intentionally, pt stuttering, possibly having expressive aphasia at this time.  No difficulties with speech noted during triage.  EDP alerted by this RN. Code stroke initiated.

## 2017-08-10 NOTE — ED Notes (Signed)
[facility] called @ 1115-Code Stroke [employer], RN

## 2017-08-10 NOTE — ED Provider Notes (Signed)
[facility] EMERGENCY DEPARTMENT Provider Note   CSN: [id number] Arrival date & time: 08/10/17  0707  History   Chief Complaint Chief Complaint  Patient presents with  . Back Pain  . Code Stroke   HPI Becky Winters is a 76 y.o. female with anxiety, HFpEF, and TIA who presented to the ED with concerns of 1-2 days of lumbar back pain. She states that 6-7 days ago she was shoveling snow and felt she irritated her back. Then on Sunday after dressing for church she quickly turned and felt the pain in her lumbar back. She states this feels the same as the bulging disc she experienced in September of 2017. At that time she experienced left leg weakness and pain. MRI confirmed L3-L4 bulging disc and L5-S1 bulging disc she was treated with percocet and referred to neurosurgery.   She is addiment about getting an MRI this ED visit. She denies changes in urinary or bowel habits. She does endorse weakness of the LLE but is able to ambulate. Denies recent illness, abdominal pain, diarrhea, new rash.   Past Medical History:  Diagnosis Date  . Anxiety    conditional, taking in preparation for surgery   . 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 Bicuspid aortic valve 05/10/2014  . Chronic diastolic congestive heart failure ([place])   . Collagenous colitis   . Compression, esophagus 2013   stretched in the past   . Encounter for therapeutic drug monitoring 06/15/2014  . Exertional dyspnea 05/10/2014  . Foot swelling    - LEFT-ever since she had an injury as a child   . GERD (gastroesophageal reflux disease)    prevacid on occasion  . H/O hiatal hernia   . HLD (hyperlipidemia)   . Hx of echocardiogram    post AVR >> Echo (12/15):  Mild LVH, EF 55%, AVR ok (Mean gradient (S): 10 mm Hg), mild MR, mild LAE  . Hypothyroidism   . Orthostatic hypotension 05/10/2014  . Postoperative atrial fibrillation ([place]) 06/09/2014  . 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 Severe aortic stenosis 05/21/2014   s/p AVR 05/2014  . TIA (transient ischemic attack)   . UTI (urinary tract infection)    frequent UTI-----pt. reports that she takes Cipro if needed, last UTI- July 2015   Patient Active Problem List   Diagnosis Date Noted  . History of aortic valve replacement with bioprosthetic valve 06/15/2017  . Coronary artery disease involving native coronary artery of native heart without angina pectoris 06/15/2017  . Typical atrial flutter ([place]) 08/29/2015  . Palpitations 08/29/2015  . Dizziness 08/29/2015  . Paroxysmal atrial fibrillation ([place]) 08/25/2015  . Collagenous colitis   . TIA (transient ischemic attack)   . Hypothyroidism   . HLD (hyperlipidemia)   . Anxiety   . GERD (gastroesophageal reflux disease)   . Compression, esophagus   . Foot swelling   . H/O hiatal hernia   . UTI (urinary tract infection)   . Arthritis   . Encounter for therapeutic drug monitoring 06/15/2014  . Postoperative atrial fibrillation ([place]) 06/09/2014  . 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 Chronic diastolic congestive heart failure ([place])   . Severe aortic stenosis 05/21/2014  . Dyspnea on exertion 05/10/2014  . Bicuspid aortic valve 05/10/2014  . Orthostatic hypotension 05/10/2014  Past Surgical History:  Procedure Laterality Date  . AORTIC VALVE REPLACEMENT N/A 06/07/2014   Procedure: AORTIC VALVE REPLACEMENT  (AVR) with 73mm Aortic Perimount Magna Ease;  Surgeon: Rexene Alberts, MD;  Location: [facility];  Service: Open Heart Surgery;  Laterality: N/A;  . APPENDECTOMY    . BLADDER SURGERY     x2 for tacking  . CARDIAC CATHETERIZATION    . cardiolite  2003, 2006  . CHOLECYSTECTOMY    . ESOPHAGEAL DILATION    . INTRAOPERATIVE TRANSESOPHAGEAL ECHOCARDIOGRAM N/A 06/07/2014   Procedure: INTRAOPERATIVE TRANSESOPHAGEAL  ECHOCARDIOGRAM;  Surgeon: Rexene Alberts, MD;  Location: [place];  Service: Open Heart Surgery;  Laterality: N/A;  . LEFT AND RIGHT HEART CATHETERIZATION WITH CORONARY ANGIOGRAM N/A 05/25/2014   Procedure: LEFT AND RIGHT HEART CATHETERIZATION WITH CORONARY ANGIOGRAM;  Surgeon: Larey Dresser, MD;  Location: [facility] C[facility] LAB;  Service: Cardiovascular;  Laterality: N/A;  . NASAL SEPTUM SURGERY  1995  . THUMB ARTHROSCOPY Left 2006   ? replacement of ligament   . TONSILLECTOMY    . TOTAL ABDOMINAL HYSTERECTOMY     PARTIAL   OB History    No data available     Home Medications    Prior to Admission medications   Medication Sig Start Date End Date Taking? Authorizing Provider  ALPRAZolam (XANAX) 0.25 MG tablet Take 1 tablet (0.25 mg total) by mouth daily as needed for sleep. 09/14/15   Larey Dresser, MD  atorvastatin (LIPITOR) 40 MG tablet Take 20 mg by mouth at bedtime.     [provider]  azithromycin (ZITHROMAX) 250 MG tablet Take 2 tablets (500 mg total) by mouth daily. 2 tabs 30-60 minutes before dental procedures 06/15/17   End, Harrell Gave, MD  b complex vitamins tablet Take 1 tablet by mouth daily.     [provider]  beta carotene w/minerals (OCUVITE) tablet Take 1 tablet by mouth 2 (two) times daily.     [provider]  Biotin 5000 MCG TABS Take 5,000 mcg by mouth 2 (two) times daily.     [provider]  cholecalciferol (VITAMIN D) 1000 UNITS tablet Take 4,000 Units by mouth daily.     [provider]  Cinnamon 500 MG TABS Take 1 tablet by mouth 2 (two) times daily.     [provider]  Coenzyme Q10 (COQ-10) 100 MG CAPS Take 100 mg by mouth 2 (two) times daily.     [provider]  DiphenhydrAMINE HCl, Sleep, 50 MG CAPS Take 50 mg by mouth at bedtime.     [provider]  folic acid (FOLVITE) 408 MCG tablet Take 800 mcg by mouth daily.     [provider]  levothyroxine (SYNTHROID, LEVOTHROID) 100 MCG  tablet Take 100 mcg by mouth daily before breakfast.  09/07/15   [provider]  Melatonin 3 MG TABS Take 3 mg by mouth at bedtime.     [provider]  methocarbamol (ROBAXIN) 500 MG tablet Take 1 tablet (500 mg total) by mouth 2 (two) times daily. 08/10/17   Ina Homes, MD  nitrofurantoin (MACRODANTIN) 100 MG capsule Take 100 mg by mouth at bedtime. UTI preventative    [provider]  NON FORMULARY Tumeric 500mg  BID    [provider]  OLIVE LEAF EXTRACT PO Take 500 mg by mouth 2 (two) times daily.     [provider]  Omega-3 Fatty Acids (FISH OIL PO) Take 1,400 mg by mouth 2 (two)  times daily.    [provider]  ondansetron (ZOFRAN-ODT) 4 MG disintegrating tablet Take 1 tablet (4 mg total) by mouth every 8 (eight) hours as needed for nausea or vomiting. 08/10/17   Ina Homes, MD  warfarin (JANTOVEN) 2.5 MG tablet Take as directed by CVRR 06/16/17   End, Harrell Gave, MD   Family History Family History  Problem Relation Age of Onset  . CAD Father 48  . Heart attack Father   . Heart attack Paternal Grandfather   . Stroke Paternal Grandmother   . CVA Unknown   . Hypertension Neg Hx    Social History Social History   Tobacco Use  . Smoking status: Never Smoker  . Smokeless tobacco: Never Used  Substance Use Topics  . Alcohol use: No  . Drug use: No   Allergies   Amiodarone; Clindamycin/lincomycin; Asacol [mesalamine]; Clindamycin; Codeine; Pentasa [mesalamine er]; Sulfa antibiotics; Amoxicillin; and Sulfamethoxazole  Review of Systems All systems reviewed and are negative for acute change except as noted in the HPI.  Physical Exam Updated Vital Signs BP (!) 149/81   Pulse (!) 57   Temp 98.1 F (36.7 C)   Resp (!) 23   SpO2 98%   General: Obese female in no acute distress HENT: Normocephalic, atraumatic, moist mucus membranes  Pulm: Good air movement with no wheezing or crackles  CV: RRR, no murmurs, no  rubs appreciated Abdomen: Active bowel sounds, soft, non-distended, no tenderness to palpation  Extremities: No LE edema, radial pulses palpable bilaterally  Skin: Warm and dry, no rashes noted  Neuro: Alert and oriented x 3, No aphasia, Cranial nerves intact bilaterally, Gross strength 5/5 in the upper extremities, Gross strength 5/5 in the right lower extremity and 4/5 in the left lower extremities, Sensation to light touch intact in all extremities  ED Treatments / Results  Labs (all labs ordered are listed, but only abnormal results are displayed) Labs Reviewed  PROTIME-INR - Abnormal; Notable for the following components:      Result Value   Prothrombin Time 20.2 (*)    All other components within normal limits  CBC - Abnormal; Notable for the following components:   WBC 13.4 (*)    RBC 5.37 (*)    Hemoglobin 15.6 (*)    All other components within normal limits  DIFFERENTIAL - Abnormal; Notable for the following components:   Neutro Abs 9.3 (*)    Monocytes Absolute 1.1 (*)    All other components within normal limits  COMPREHENSIVE METABOLIC PANEL - Abnormal; Notable for the following components:   CO2 16 (*)    Glucose, Bld 102 (*)    Creatinine, Ser 1.01 (*)    GFR calc non Af Amer 53 (*)    All other components within normal limits  I-STAT CHEM 8, ED - Abnormal; Notable for the following components:   Potassium 3.4 (*)    Glucose, Bld 104 (*)    Calcium, Ion 1.07 (*)    TCO2 18 (*)    Hemoglobin 16.0 (*)    HCT 47.0 (*)    All other components within normal limits  APTT  I-STAT TROPONIN, ED  CBG MONITORING, ED   EKG  EKG Interpretation  Date/Time:  Monday August 10 2017 09:51:31 EST Ventricular Rate:  71 PR Interval:    QRS Duration: 99 QT Interval:  444 QTC Calculation: 483 R Axis:   30 Text Interpretation:  Sinus rhythm Left atrial enlargement Borderline repolarization abnormality No significant change since last  tracing Confirmed by Blanchie Dessert  [id number]) on 08/10/2017 10:49:37 AM      Radiology Ct Head Code Stroke Wo Contrast  Result Date: 08/10/2017 CLINICAL DATA:  Code stroke. NEW ONSET Difficulty forming words, and thinking of words. EXAM: CT HEAD WITHOUT CONTRAST TECHNIQUE: Contiguous axial images were obtained from the base of the skull through the vertex without intravenous contrast. COMPARISON:  MR head 11/02/2015 FINDINGS: Brain: No evidence for acute infarction, hemorrhage, mass lesion, hydrocephalus, or extra-axial fluid. Normal for age cerebral volume. Hypoattenuation of white matter, favored to represent small vessel disease. Vascular: No hyperdense vessel or unexpected calcification. Skull: Normal. Negative for fracture or focal lesion. Sinuses/Orbits: No acute finding. Other: None. ASPECTS [facility] Stroke Program Early CT Score) - Ganglionic level infarction (caudate, lentiform nuclei, internal capsule, insula, M1-M3 cortex): 7 - Supraganglionic infarction (M4-M6 cortex): 3 Total score (0-10 with 10 being normal): 10 IMPRESSION: 1. Atrophy and small vessel disease.  No acute intracranial findings 2. ASPECTS is 10. These results were communicated to Dr. Cheral Marker at 9:43 amon 12/17/2018by text page via the [facility] messaging system. Electronically Signed   By: Staci Righter M.D.   On: 08/10/2017 09:45   Procedures Procedures (including critical care time)  Medications Ordered in ED Medications  ondansetron (ZOFRAN) 4 MG/2ML injection (not administered)  morphine 4 MG/ML injection (4 mg  Given 08/10/17 1035)   Initial Impression / Assessment and Plan / ED Course  I have reviewed the triage vital signs and the nursing notes.  Pertinent labs & imaging results that were available during my care of the patient were reviewed by me and considered in my medical decision making (see chart for details).    76 y.o female with anxiety, HFpEF, TIA and previous L3-L4/L5-S1 bulging disc who presented to the ED with concerns of 1-2 days of  lumbar back pain. She denies any changes in bladder or bowel function and believes this is that same pain/symptoms she experienced during her last bulging disc. Physical exam does show some slight weakness of the left lower extremity. Do not feel the patient needs an MRI at this point. [place] drug database checked. Last opiate prescription filled in 2017. Patient has plenty of oxycodone at her home. Will give prescription for methocarbamol and ondansetron. Standard return precautions given and the patient will follow-up with Dr. Vertell Limber and her PCP. Patient agrees with the plan and is stable for discharge.   Final Clinical Impressions(s) / ED Diagnoses   Final diagnoses:  Acute low back pain, unspecified back pain laterality, with sciatica presence unspecified   ED Discharge Orders        Ordered    methocarbamol (ROBAXIN) 500 MG tablet  2 times daily     08/10/17 1210    ondansetron (ZOFRAN-ODT) 4 MG disintegrating tablet  Every 8 hours PRN     08/10/17 1210       Ina Homes, MD 08/10/17 1227    Blanchie Dessert, MD 08/10/17 2156

## 2017-08-10 NOTE — ED Provider Notes (Signed)
MSE was initiated and I personally evaluated the patient and placed orders (if any) at  9:19 AM on August 10, 2017.  The patient appears stable so that the remainder of the MSE may be completed by another provider.  Patient presenting for back pain, however during the time that she was waiting in the waiting room (in the past 15 minutes), patient's husband reports her mental status changed drastically.  He reports that she was not recognizing him and having speech difficulties.  I evaluated the patient and patient does appear to be having aphasia with slurred speech. However, her symptoms seemed to improve the longer I was in the room. She continues to complain about back pain as her main concern and patient repeatedly talking about having to wait when she was in pain.  Patient reports she does have some decreased sensation on my exam of her upper extremities and face.  5/5 strength to all 4 extremities.  No ataxia on finger to nose.  Cranial nerves intact.   Patient has no history of stroke, per husband.  Code stroke called by nursing staff prior to my entering the room, however I agree.  Labs drawn and patient sent to CT scan for CT of the head, code stroke protocol.  Patient will be moved to higher acuity room.   Frederica Kuster, PA-C 08/10/17 1135    Blanchie Dessert, MD 08/10/17 2157

## 2017-08-10 NOTE — ED Notes (Signed)
Pt again ask for morphine as she was given last time. Dr. Theora Gianotti aware of request and gives verbal orders for same.

## 2017-08-10 NOTE — Discharge Instructions (Addendum)
Thank you for allowing [place] to provide your care. Please call doctor Vertell Limber to schedule an appointment as soon as possible.

## 2017-08-10 NOTE — Consult Note (Signed)
NEURO HOSPITALIST CONSULT NOTE   Requestig physician: Dr. Vanita Panda  Reason for Consult: Acute onset of confused speech in the setting of severe pain, tearfulness and agitation  History obtained from:  Patient and Chart     HPI:                                                                                                                                          Becky Winters is an 76 y.o. female presenting with acute LBP. During her assessment by nursing staff, the patient's husband stated that she became suddenly confused. Code Stroke was called.   RN note reviewed: "This RN alerted by pt's spouse that pt newly AMS, spouse tearful, stated, "She doesn't know who I am." This RN assessed pt. Pt seen to be using BUE equally and intentionally, pt stuttering, possibly having expressive aphasia at this time.  No difficulties with speech noted during triage.  EDP alerted by this RN. Code stroke initiated."  At time of CT head, the patient states that she is not confused and is not having trouble talking, except for difficulty attending to questions due to severe pain.    Past Medical History:  Diagnosis Date  . Anxiety    conditional, taking in preparation for surgery   . 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 Bicuspid aortic valve 05/10/2014  . Chronic diastolic congestive heart failure ([place])   . Collagenous colitis   . Compression, esophagus 2013   stretched in the past   . Encounter for therapeutic drug monitoring 06/15/2014  . Exertional dyspnea 05/10/2014  . Foot swelling    - LEFT-ever since she had an injury as a child   . GERD (gastroesophageal reflux disease)    prevacid on occasion  . H/O hiatal hernia   . HLD (hyperlipidemia)   . Hx of echocardiogram    post AVR >> Echo (12/15):  Mild LVH, EF 55%, AVR ok (Mean gradient (S): 10 mm Hg), mild MR, mild LAE  . Hypothyroidism   . Orthostatic hypotension  05/10/2014  . Postoperative atrial fibrillation ([place]) 06/09/2014  . 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/07/2014   23 mm [facility] Ease bovine pericardial tissue valve  . Severe aortic stenosis 05/21/2014   s/p AVR 05/2014  . TIA (transient ischemic attack)   . UTI (urinary tract infection)    frequent UTI-----pt. reports that she takes Cipro if needed, last UTI- July 2015    Past Surgical History:  Procedure Laterality Date  . AORTIC VALVE REPLACEMENT N/A 06/07/2014   Procedure: AORTIC VALVE REPLACEMENT  (AVR) with 28mm Aortic Perimount Magna Ease;  Surgeon: Rexene Alberts, MD;  Location: [place];  Service: Open Heart Surgery;  Laterality: N/A;  . APPENDECTOMY    . BLADDER SURGERY     x2 for tacking  . CARDIAC CATHETERIZATION    . cardiolite  2003, 2006  . CHOLECYSTECTOMY    . ESOPHAGEAL DILATION    . INTRAOPERATIVE TRANSESOPHAGEAL ECHOCARDIOGRAM N/A 06/07/2014   Procedure: INTRAOPERATIVE TRANSESOPHAGEAL ECHOCARDIOGRAM;  Surgeon: Rexene Alberts, MD;  Location: [place];  Service: Open Heart Surgery;  Laterality: N/A;  . LEFT AND RIGHT HEART CATHETERIZATION WITH CORONARY ANGIOGRAM N/A 05/25/2014   Procedure: LEFT AND RIGHT HEART CATHETERIZATION WITH CORONARY ANGIOGRAM;  Surgeon: Larey Dresser, MD;  Location: [facility] CATH LAB;  Service: Cardiovascular;  Laterality: N/A;  . NASAL SEPTUM SURGERY  1995  . THUMB ARTHROSCOPY Left 2006   ? replacement of ligament   . TONSILLECTOMY    . TOTAL ABDOMINAL HYSTERECTOMY     PARTIAL    Family History  Problem Relation Age of Onset  . CAD Father 108  . Heart attack Father   . Heart attack Paternal Grandfather   . Stroke Paternal Grandmother   . CVA Unknown   . Hypertension Neg Hx     Social History:  reports that  has never smoked. she has never used smokeless tobacco. She reports that she does not drink alcohol or use drugs.  Allergies  Allergen Reactions  . Amiodarone Other (See Comments)    Dizziness, impaired  vision (felt like eyes were crossed)  . Clindamycin/Lincomycin Itching and Rash  . Asacol [Mesalamine] Other (See Comments)    GI Upset  Severe diarrhea   . Clindamycin Rash  . Codeine Nausea And Vomiting and Nausea Only  . Pentasa [Mesalamine Er] Other (See Comments)    Severe diarrhea   . Sulfa Antibiotics Rash  . Amoxicillin Rash  . Sulfamethoxazole Rash    HOME MEDICATIONS:                                                                                                                       ROS:                                                                                                                                       Detailed ROS deferred due to acuity of presentation. Severe LBP. Other ROS as per HPI.   Blood pressure (!) 149/81, pulse (!) 57, temperature 98.1 F (36.7 C), resp.  rate (!) 23, SpO2 98 %.  General Examination:                                                                                                      HEENT-  [place]/AT  Lungs- No gross wheezing audible at bedside Extremities- No edema  Neurological Examination Mental Status: Awake and alert. Tearful and agitated. Verbally expresses frustration at times with clear, nondysarthric speech. Frequently exclaims in pain. Poor cooperation with staff initially due to pain. Can be soothed by RN. Speech fluent without errors of grammar or syntax. Able to follow all commands. Naming intact.  Cranial Nerves: II: Visual fields intact. No extinction to DSS.  III,IV, VI: Ptosis not present, EOMI without nystagmus V,VII: Grimace symmetric. Reacts to tactile stimulation bilaterally VIII: hearing intact to voice IX,X: No hypophonia XI: Head at midline XII: midline tongue extension Motor: Right : Upper extremity   5/5    Left:     Upper extremity   5/5  Lower extremity   5/5     Lower extremity   5/5 Normal tone throughout; no atrophy noted Sensory: Intact FT x 4. No extinction to DSS.  Deep Tendon Reflexes:  1-2+ and symmetric throughout Cerebellar: No ataxia noted.  Gait: Deferred due to acuity of presentation   Lab Results: Basic Metabolic Panel: Recent Labs  Lab 08/10/17 0910 08/10/17 0922  NA 138 141  K 3.5 3.4*  CL 108 107  CO2 16*  --   GLUCOSE 102* 104*  BUN 18 20  CREATININE 1.01* 0.80  CALCIUM 10.0  --     Liver Function Tests: Recent Labs  Lab 08/10/17 0910  AST 27  ALT 15  ALKPHOS 70  BILITOT 1.2  PROT 6.9  ALBUMIN 3.7   No results for input(s): LIPASE, AMYLASE in the last 168 hours. No results for input(s): AMMONIA in the last 168 hours.  CBC: Recent Labs  Lab 08/10/17 0910 08/10/17 0922  WBC 13.4*  --   NEUTROABS 9.3*  --   HGB 15.6* 16.0*  HCT 45.3 47.0*  MCV 84.4  --   PLT 254  --     Cardiac Enzymes: No results for input(s): CKTOTAL, CKMB, CKMBINDEX, TROPONINI in the last 168 hours.  Lipid Panel: No results for input(s): CHOL, TRIG, HDL, CHOLHDL, VLDL, LDLCALC in the last 168 hours.  CBG: No results for input(s): GLUCAP in the last 168 hours.  Microbiology: Results for orders placed or performed during the hospital encounter of 06/05/14  Surgical pcr screen     Status: None   Collection Time: 06/05/14  9:27 AM  Result Value Ref Range Status   MRSA, PCR NEGATIVE NEGATIVE Final   Staphylococcus aureus NEGATIVE NEGATIVE Final    Comment:        The Xpert SA Assay (FDA approved for NASAL specimens in patients over 74 years of age), is one component of a comprehensive surveillance program.  Test performance has been validated by [employer] for patients greater than or equal to 71 year old. It is not intended to diagnose infection nor  to guide or monitor treatment.    Coagulation Studies: Recent Labs    08/10/17 0910  LABPROT 20.2*  INR 1.75    Imaging: Ct Head Code Stroke Wo Contrast  Result Date: 08/10/2017 CLINICAL DATA:  Code stroke. NEW ONSET Difficulty forming words, and thinking of words. EXAM: CT HEAD WITHOUT  CONTRAST TECHNIQUE: Contiguous axial images were obtained from the base of the skull through the vertex without intravenous contrast. COMPARISON:  MR head 11/02/2015 FINDINGS: Brain: No evidence for acute infarction, hemorrhage, mass lesion, hydrocephalus, or extra-axial fluid. Normal for age cerebral volume. Hypoattenuation of white matter, favored to represent small vessel disease. Vascular: No hyperdense vessel or unexpected calcification. Skull: Normal. Negative for fracture or focal lesion. Sinuses/Orbits: No acute finding. Other: None. ASPECTS [facility] Stroke Program Early CT Score) - Ganglionic level infarction (caudate, lentiform nuclei, internal capsule, insula, M1-M3 cortex): 7 - Supraganglionic infarction (M4-M6 cortex): 3 Total score (0-10 with 10 being normal): 10 IMPRESSION: 1. Atrophy and small vessel disease.  No acute intracranial findings 2. ASPECTS is 10. These results were communicated to Dr. Cheral Marker at 9:43 amon 12/17/2018by text page via the [facility] messaging system. Electronically Signed   By: Staci Righter M.D.   On: 08/10/2017 09:45    Assessment: 76 year old female presenting with acute low back pain followed by sudden onset of confused speech per husband 1. Neurological exam nonfocal. Speech is fluent with intact comprehension and naming.  2. CT head shows no acute abnormality.  3. Acute LBP  Recommendations: 1. Pain management 2. MRI and MRA of brain. Likely to be low-yield, but should be obtained to rule out possible small left frontal opercular or posterior temporal lobe stroke 3. Please call Neurology if imaging studies are positive for acute abnormality   Electronically signed: Dr. Kerney Elbe 08/10/2017, 11:35 AM

## 2017-08-10 NOTE — ED Notes (Signed)
Pt appears to be speaking more fluently at this time, able to account for recent events. Language deficits appear possibly intermittent.

## 2017-08-10 NOTE — ED Triage Notes (Addendum)
Pt states, "I've got a ruptured disc, last I did last year."  Pt reports twisting and having sudden pain in lower back.  Pt reports n/v, reports unable unable to take home meds for pain.  Pt denies loss of bowel or bladder control, reports pain to LLE.

## 2017-08-10 NOTE — ED Notes (Addendum)
Pt ambulatory to restroom with slow and steady gait. Pt reports pain at approx 4/10 while ambulating but it is not nearly as bad as prior. EDP aware.

## 2017-08-10 NOTE — ED Notes (Signed)
Pt to Room 45 via wc. Moves to bed and warm blanket applied. Pt c/o continued back pain and request medications for same.

## 2017-08-10 NOTE — ED Notes (Signed)
Pt transported to [place] via [place]. Pt calling out "Ineed my husband." Explained that husband cannot be in CT for test. Husband to ED room.Pt very agitated and tearful at times. Speaks in complete sentences and has no noted slurred speech. [place] Respiration rapid and non labored. Instructed pt to attempt to slow respiration.

## 2017-08-22 ENCOUNTER — Other Ambulatory Visit: Payer: Self-pay | Admitting: Neurosurgery

## 2017-08-22 DIAGNOSIS — M5412 Radiculopathy, cervical region: Secondary | ICD-10-CM

## 2017-08-30 ENCOUNTER — Ambulatory Visit
Admission: RE | Admit: 2017-08-30 | Discharge: 2017-08-30 | Disposition: A | Discharge status: Home / Self Care | Payer: Medicare Other | Source: Ambulatory Visit | Attending: Neurosurgery | Admitting: Neurosurgery

## 2017-08-30 DIAGNOSIS — M5412 Radiculopathy, cervical region: Secondary | ICD-10-CM

## 2017-08-30 DIAGNOSIS — M4802 Spinal stenosis, cervical region: Secondary | ICD-10-CM | POA: Diagnosis not present

## 2017-09-01 ENCOUNTER — Other Ambulatory Visit: Payer: Self-pay | Admitting: Neurosurgery

## 2017-09-01 DIAGNOSIS — M5416 Radiculopathy, lumbar region: Secondary | ICD-10-CM

## 2017-09-08 ENCOUNTER — Ambulatory Visit (INDEPENDENT_AMBULATORY_CARE_PROVIDER_SITE_OTHER): Payer: Medicare Other

## 2017-09-08 ENCOUNTER — Other Ambulatory Visit: Payer: Self-pay | Admitting: Neurosurgery

## 2017-09-08 DIAGNOSIS — I9789 Other postprocedural complications and disorders of the circulatory system, not elsewhere classified: Secondary | ICD-10-CM

## 2017-09-08 DIAGNOSIS — Z953 Presence of xenogenic heart valve: Secondary | ICD-10-CM | POA: Diagnosis not present

## 2017-09-08 DIAGNOSIS — Z5181 Encounter for therapeutic drug level monitoring: Secondary | ICD-10-CM

## 2017-09-08 DIAGNOSIS — I4891 Unspecified atrial fibrillation: Secondary | ICD-10-CM

## 2017-09-08 DIAGNOSIS — Q231 Congenital insufficiency of aortic valve: Secondary | ICD-10-CM

## 2017-09-08 LAB — POCT INR: INR: 1.9

## 2017-09-08 NOTE — Patient Instructions (Signed)
Description   Take 1.5 tablets today, then resume same dosage 1 tablet everyday and 1/2 tablet on Sundays and Thursdays. Recheck in 4 weeks.

## 2017-09-09 ENCOUNTER — Ambulatory Visit
Admission: RE | Admit: 2017-09-09 | Discharge: 2017-09-09 | Disposition: A | Discharge status: Home / Self Care | Payer: Medicare Other | Source: Ambulatory Visit | Attending: Neurosurgery | Admitting: Neurosurgery

## 2017-09-09 DIAGNOSIS — M48061 Spinal stenosis, lumbar region without neurogenic claudication: Secondary | ICD-10-CM | POA: Diagnosis not present

## 2017-09-09 DIAGNOSIS — M5416 Radiculopathy, lumbar region: Secondary | ICD-10-CM

## 2017-09-14 DIAGNOSIS — M5126 Other intervertebral disc displacement, lumbar region: Secondary | ICD-10-CM | POA: Diagnosis not present

## 2017-09-14 DIAGNOSIS — M5136 Other intervertebral disc degeneration, lumbar region: Secondary | ICD-10-CM | POA: Diagnosis not present

## 2017-09-14 DIAGNOSIS — M545 Low back pain: Secondary | ICD-10-CM | POA: Diagnosis not present

## 2017-09-14 DIAGNOSIS — M5416 Radiculopathy, lumbar region: Secondary | ICD-10-CM | POA: Diagnosis not present

## 2017-09-15 ENCOUNTER — Telehealth: Payer: Self-pay | Admitting: Internal Medicine

## 2017-09-15 DIAGNOSIS — R921 Mammographic calcification found on diagnostic imaging of breast: Secondary | ICD-10-CM | POA: Diagnosis not present

## 2017-09-15 DIAGNOSIS — R922 Inconclusive mammogram: Secondary | ICD-10-CM | POA: Diagnosis not present

## 2017-09-15 NOTE — Telephone Encounter (Signed)
    Preop request reviewed.  Coumadin instructions only are requested, will forward to the Pharmacists.  Rosaria Ferries, PA-C 09/15/2017 8:27 PM Beeper [phone]

## 2017-09-15 NOTE — Telephone Encounter (Signed)
New Message     [facility] Pre-operative Risk Assessment    Request for surgical clearance:  1. What type of surgery is being performed? Breast biopsy  2. When is this surgery scheduled? 09/23/2017   3. What type of clearance is required (medical clearance vs. Pharmacy clearance to hold med vs. Both)? Coumadin   4. Are there any medications that need to be held prior to surgery and how long? TBD  5. Practice name and name of physician performing surgery? [facility]  6. What is your office phone and fax number? [phone] fax [phone]  7. Anesthesia type (None, local, MAC, general) ? none   [place] 09/15/2017, 11:10 AM  _________________________________________________________________   (provider comments below)

## 2017-09-16 ENCOUNTER — Telehealth: Payer: Self-pay | Admitting: Internal Medicine

## 2017-09-16 NOTE — Telephone Encounter (Signed)
Pt takes Coumadin for afib with CHADS2VASc score of 6 (age, sex, CHF, TIA, CAD), also has hx of bioprosthetic aortic valve. Due to hx of afib with TIA, pt would require bridging with Lovenox in order to hold her warfarin for 5 days prior to her biopsy. Procedure is in 1 week so this will need to be coordinated in the next day or two.  Pt will come in tomorrow for her Lovenox bridge. Of note, pt states her procedure is on the 31st not the 30th, at 9:15am.  Clearance routed.

## 2017-09-16 NOTE — Telephone Encounter (Signed)
S/w pt is coming in tomorrow to get a lovenox bridge for a breast  biopsy on Thursday, January 31 @ 9:15 at [facility]. Stated in note pt is having a bridge done in february that needs antibiotics which pt already takes for dental procedures.

## 2017-09-16 NOTE — Telephone Encounter (Signed)
   Primary Cardiologist: Dr. Gerald Stabs End  Pt takes Coumadin for afib with CHADS2VASc score of 6 (age, sex, CHF, TIA, CAD), also has hx of bioprosthetic aortic valve. Due to hx of afib with TIA, pt would require bridging with Lovenox in order to hold her warfarin for 5 days prior to her biopsy. Procedure is in 1 week so this will need to be coordinated in the next day or two.  Pt will come in tomorrow for her Lovenox bridge. Of note, pt states her procedure is on the 31st not the 30th, at 9:15am.  Clearance routed.  Chart reviewed as part of pre-operative protocol coverage. Given past medical history and time since last visit, based on ACC/AHA guidelines, Becky Winters would be at acceptable risk for the planned procedure without further cardiovascular testing.   She does need SBE - she is to take Zithromax 500 mg 30 to 60 minutes prior to her procedure.   I will route this recommendation to the requesting party via Epic fax function and remove from pre-op pool.  Please call with questions.  Truitt Merle, NP 09/16/2017, 2:30 PM

## 2017-09-16 NOTE — Telephone Encounter (Signed)
New message     1. What dental office are you calling from?  Dr Elveria Rising  2. What is your office phone and fax number? Fax [phone], phone [phone] ATTN: [facility] [place]  3. What type of procedure is the patient having performed? Bridge  4. What date is procedure scheduled or is the patient there now? 2/19  What is your question (ex. Antibiotics prior to procedure, holding medication-we need to know how long dentist wants pt to hold med)? warfarin (JANTOVEN) 2.5 MG tablet

## 2017-09-17 ENCOUNTER — Ambulatory Visit (INDEPENDENT_AMBULATORY_CARE_PROVIDER_SITE_OTHER): Payer: Medicare Other | Admitting: *Deleted

## 2017-09-17 DIAGNOSIS — Z5181 Encounter for therapeutic drug level monitoring: Secondary | ICD-10-CM

## 2017-09-17 DIAGNOSIS — Q2381 Bicuspid aortic valve: Secondary | ICD-10-CM

## 2017-09-17 DIAGNOSIS — I4891 Unspecified atrial fibrillation: Secondary | ICD-10-CM | POA: Diagnosis not present

## 2017-09-17 DIAGNOSIS — Q231 Congenital insufficiency of aortic valve: Secondary | ICD-10-CM | POA: Diagnosis not present

## 2017-09-17 DIAGNOSIS — Z953 Presence of xenogenic heart valve: Secondary | ICD-10-CM

## 2017-09-17 DIAGNOSIS — I9789 Other postprocedural complications and disorders of the circulatory system, not elsewhere classified: Secondary | ICD-10-CM

## 2017-09-17 LAB — POCT INR: INR: 1.9

## 2017-09-17 MED ORDER — ENOXAPARIN SODIUM 80 MG/0.8ML ~~LOC~~ SOLN: 80.0000 mg | Freq: Two times a day (BID) | SUBCUTANEOUS | 1 refills | Status: DC

## 2017-09-17 NOTE — Patient Instructions (Addendum)
09/17/17: Take Coumadin 1 tablets today   09/18/17: Last dose of Coumadin.  09/19/17: No Coumadin or Lovenox.  09/20/17: Inject Lovenox 80 mg in the fatty abdominal tissue at least 2 inches from the belly button twice a day about 12 hours apart, 8am and 8pm rotate sites. No Coumadin.  09/21/17: Inject Lovenox in the fatty tissue every 12 hours, 8am and 8pm. No Coumadin.  09/22/17: Inject Lovenox in the fatty tissue every 12 hours, 8am and 8pm. No Coumadin.  09/23/17: Inject Lovenox in the fatty tissue in the morning at 8 am (No PM dose). No Coumadin.  09/24/17: Procedure Day - No Lovenox - Resume Coumadin in the evening or as directed by doctor.   09/25/17: Resume Lovenox inject in the fatty tissue every 12 hours and take Coumadin.  09/26/17: Inject Lovenox in the fatty tissue every 12 hours and take Coumadin.  09/27/17: Inject Lovenox in the fatty tissue every 12 hours and take Coumadin.  09/28/17: Inject Lovenox in the fatty tissue every 12 hours and take Coumadin.  09/29/17: Inject Lovenox in the fatty tissue every 12 hours and take Coumadin.  09/30/17: Inject Lovenox in the fatty tissue at 8am & report to INR appt.   Description   Take 1 tablets today, then resume same dosage 1 tablet everyday and 1/2 tablet on Sundays and Thursdays. Follow Lovenox instructions for upcoming procedure. Recheck in 1 week after procedure (biopsy). Coumadin Clinic [phone]

## 2017-09-24 ENCOUNTER — Other Ambulatory Visit: Payer: Self-pay | Admitting: Radiology

## 2017-09-24 DIAGNOSIS — N6320 Unspecified lump in the left breast, unspecified quadrant: Secondary | ICD-10-CM | POA: Diagnosis not present

## 2017-09-24 DIAGNOSIS — R921 Mammographic calcification found on diagnostic imaging of breast: Secondary | ICD-10-CM | POA: Diagnosis not present

## 2017-09-30 ENCOUNTER — Ambulatory Visit (INDEPENDENT_AMBULATORY_CARE_PROVIDER_SITE_OTHER): Payer: Medicare Other | Admitting: *Deleted

## 2017-09-30 DIAGNOSIS — I9789 Other postprocedural complications and disorders of the circulatory system, not elsewhere classified: Secondary | ICD-10-CM | POA: Diagnosis not present

## 2017-09-30 DIAGNOSIS — Q231 Congenital insufficiency of aortic valve: Secondary | ICD-10-CM

## 2017-09-30 DIAGNOSIS — G459 Transient cerebral ischemic attack, unspecified: Secondary | ICD-10-CM

## 2017-09-30 DIAGNOSIS — Z5181 Encounter for therapeutic drug level monitoring: Secondary | ICD-10-CM | POA: Diagnosis not present

## 2017-09-30 DIAGNOSIS — I4891 Unspecified atrial fibrillation: Secondary | ICD-10-CM

## 2017-09-30 DIAGNOSIS — I480 Paroxysmal atrial fibrillation: Secondary | ICD-10-CM | POA: Diagnosis not present

## 2017-09-30 DIAGNOSIS — Z953 Presence of xenogenic heart valve: Secondary | ICD-10-CM | POA: Diagnosis not present

## 2017-09-30 LAB — POCT INR: INR: 1.2

## 2017-09-30 MED ORDER — ENOXAPARIN SODIUM 80 MG/0.8ML ~~LOC~~ SOLN: 80.0000 mg | Freq: Two times a day (BID) | SUBCUTANEOUS | 0 refills | Status: DC

## 2017-09-30 NOTE — Patient Instructions (Signed)
Description   Today Feb 6th take 1 and 1/2 tablets (3.75mg ) then tomorrow Feb 7th take 1 tablet (2.5mg ) then change coumadin dose to 1 tablet everyday and 1/2 tablet only on  Thursdays. Continue Lovenox 80 mg every 12 hours until seen in coumadin clinic on Monday 11th . Recheck INR on Monday Feb 11th   Call with any questions. Coumadin Clinic [phone]

## 2017-10-05 ENCOUNTER — Ambulatory Visit (INDEPENDENT_AMBULATORY_CARE_PROVIDER_SITE_OTHER): Payer: Medicare Other

## 2017-10-05 DIAGNOSIS — Z953 Presence of xenogenic heart valve: Secondary | ICD-10-CM

## 2017-10-05 DIAGNOSIS — Z5181 Encounter for therapeutic drug level monitoring: Secondary | ICD-10-CM

## 2017-10-05 DIAGNOSIS — Q2381 Bicuspid aortic valve: Secondary | ICD-10-CM

## 2017-10-05 DIAGNOSIS — Q231 Congenital insufficiency of aortic valve: Secondary | ICD-10-CM

## 2017-10-05 DIAGNOSIS — I4891 Unspecified atrial fibrillation: Secondary | ICD-10-CM

## 2017-10-05 DIAGNOSIS — I9789 Other postprocedural complications and disorders of the circulatory system, not elsewhere classified: Secondary | ICD-10-CM | POA: Diagnosis not present

## 2017-10-05 LAB — POCT INR: INR: 1.8

## 2017-10-05 NOTE — Patient Instructions (Signed)
Description   Take 1.5 tablets tonight, then resume same dosage 1 tablet everyday and 1/2 tablet on Thursdays. Take your last Lovenox 80 mg injection tonight.  Recheck INR in 1 week.   Call with any questions. Coumadin Clinic [phone]

## 2017-10-12 ENCOUNTER — Ambulatory Visit (INDEPENDENT_AMBULATORY_CARE_PROVIDER_SITE_OTHER): Payer: Medicare Other | Admitting: Pharmacist

## 2017-10-12 DIAGNOSIS — Q231 Congenital insufficiency of aortic valve: Secondary | ICD-10-CM | POA: Diagnosis not present

## 2017-10-12 DIAGNOSIS — I4891 Unspecified atrial fibrillation: Secondary | ICD-10-CM | POA: Diagnosis not present

## 2017-10-12 DIAGNOSIS — Z953 Presence of xenogenic heart valve: Secondary | ICD-10-CM

## 2017-10-12 DIAGNOSIS — Z5181 Encounter for therapeutic drug level monitoring: Secondary | ICD-10-CM | POA: Diagnosis not present

## 2017-10-12 DIAGNOSIS — I9789 Other postprocedural complications and disorders of the circulatory system, not elsewhere classified: Secondary | ICD-10-CM | POA: Diagnosis not present

## 2017-10-12 LAB — POCT INR: INR: 1.7

## 2017-10-12 NOTE — Patient Instructions (Signed)
Description   Take 1.5 tablets today and tomorrow, then start taking 1 tablet daily. Recheck INR in 10 days. Call with any questions. Coumadin Clinic [phone]

## 2017-10-20 DIAGNOSIS — E7849 Other hyperlipidemia: Secondary | ICD-10-CM | POA: Diagnosis not present

## 2017-10-20 DIAGNOSIS — E038 Other specified hypothyroidism: Secondary | ICD-10-CM | POA: Diagnosis not present

## 2017-10-20 DIAGNOSIS — R82998 Other abnormal findings in urine: Secondary | ICD-10-CM | POA: Diagnosis not present

## 2017-10-20 DIAGNOSIS — E559 Vitamin D deficiency, unspecified: Secondary | ICD-10-CM | POA: Diagnosis not present

## 2017-10-22 ENCOUNTER — Ambulatory Visit (INDEPENDENT_AMBULATORY_CARE_PROVIDER_SITE_OTHER): Payer: Medicare Other | Admitting: *Deleted

## 2017-10-22 DIAGNOSIS — I9789 Other postprocedural complications and disorders of the circulatory system, not elsewhere classified: Secondary | ICD-10-CM | POA: Diagnosis not present

## 2017-10-22 DIAGNOSIS — G459 Transient cerebral ischemic attack, unspecified: Secondary | ICD-10-CM

## 2017-10-22 DIAGNOSIS — I4891 Unspecified atrial fibrillation: Secondary | ICD-10-CM

## 2017-10-22 DIAGNOSIS — Q231 Congenital insufficiency of aortic valve: Secondary | ICD-10-CM | POA: Diagnosis not present

## 2017-10-22 DIAGNOSIS — Z5181 Encounter for therapeutic drug level monitoring: Secondary | ICD-10-CM | POA: Diagnosis not present

## 2017-10-22 DIAGNOSIS — Z953 Presence of xenogenic heart valve: Secondary | ICD-10-CM

## 2017-10-22 DIAGNOSIS — I480 Paroxysmal atrial fibrillation: Secondary | ICD-10-CM | POA: Diagnosis not present

## 2017-10-22 LAB — POCT INR: INR: 2.2

## 2017-10-22 NOTE — Patient Instructions (Signed)
Description   Continue  taking 1 tablet daily. Recheck INR in 3 weeks. Call with any questions. Coumadin Clinic [phone]

## 2017-11-03 DIAGNOSIS — E559 Vitamin D deficiency, unspecified: Secondary | ICD-10-CM | POA: Diagnosis not present

## 2017-11-03 DIAGNOSIS — M5136 Other intervertebral disc degeneration, lumbar region: Secondary | ICD-10-CM | POA: Diagnosis not present

## 2017-11-03 DIAGNOSIS — K222 Esophageal obstruction: Secondary | ICD-10-CM | POA: Diagnosis not present

## 2017-11-03 DIAGNOSIS — I2510 Atherosclerotic heart disease of native coronary artery without angina pectoris: Secondary | ICD-10-CM | POA: Diagnosis not present

## 2017-11-03 DIAGNOSIS — Z952 Presence of prosthetic heart valve: Secondary | ICD-10-CM | POA: Diagnosis not present

## 2017-11-03 DIAGNOSIS — Z0000 Encounter for general adult medical examination without abnormal findings: Secondary | ICD-10-CM | POA: Diagnosis not present

## 2017-11-03 DIAGNOSIS — E7849 Other hyperlipidemia: Secondary | ICD-10-CM | POA: Diagnosis not present

## 2017-11-03 DIAGNOSIS — Z6829 Body mass index (BMI) 29.0-29.9, adult: Secondary | ICD-10-CM | POA: Diagnosis not present

## 2017-11-03 DIAGNOSIS — I779 Disorder of arteries and arterioles, unspecified: Secondary | ICD-10-CM | POA: Diagnosis not present

## 2017-11-03 DIAGNOSIS — I480 Paroxysmal atrial fibrillation: Secondary | ICD-10-CM | POA: Diagnosis not present

## 2017-11-03 DIAGNOSIS — Z1389 Encounter for screening for other disorder: Secondary | ICD-10-CM | POA: Diagnosis not present

## 2017-11-03 DIAGNOSIS — G459 Transient cerebral ischemic attack, unspecified: Secondary | ICD-10-CM | POA: Diagnosis not present

## 2017-11-12 ENCOUNTER — Ambulatory Visit (INDEPENDENT_AMBULATORY_CARE_PROVIDER_SITE_OTHER): Payer: Medicare Other | Admitting: Pharmacist

## 2017-11-12 DIAGNOSIS — Z953 Presence of xenogenic heart valve: Secondary | ICD-10-CM

## 2017-11-12 DIAGNOSIS — I4891 Unspecified atrial fibrillation: Secondary | ICD-10-CM

## 2017-11-12 DIAGNOSIS — I9789 Other postprocedural complications and disorders of the circulatory system, not elsewhere classified: Secondary | ICD-10-CM

## 2017-11-12 DIAGNOSIS — Z5181 Encounter for therapeutic drug level monitoring: Secondary | ICD-10-CM

## 2017-11-12 DIAGNOSIS — Q231 Congenital insufficiency of aortic valve: Secondary | ICD-10-CM | POA: Diagnosis not present

## 2017-11-12 LAB — POCT INR: INR: 3.2

## 2017-11-12 NOTE — Patient Instructions (Signed)
Description   Take 1/2 tablet today, then continue taking 1 tablet daily. Recheck INR in 3 weeks. Call with any questions. Coumadin Clinic [phone]

## 2017-11-18 ENCOUNTER — Encounter: Payer: Self-pay | Admitting: Internal Medicine

## 2017-11-23 ENCOUNTER — Other Ambulatory Visit: Payer: Self-pay | Admitting: Cardiology

## 2017-11-25 ENCOUNTER — Other Ambulatory Visit: Payer: Self-pay | Admitting: *Deleted

## 2017-11-25 MED ORDER — WARFARIN SODIUM 2.5 MG PO TABS: ORAL_TABLET | ORAL | 1 refills | Status: DC

## 2017-11-30 ENCOUNTER — Ambulatory Visit (INDEPENDENT_AMBULATORY_CARE_PROVIDER_SITE_OTHER): Payer: Medicare Other | Admitting: *Deleted

## 2017-11-30 ENCOUNTER — Ambulatory Visit (INDEPENDENT_AMBULATORY_CARE_PROVIDER_SITE_OTHER): Payer: Medicare Other | Admitting: Internal Medicine

## 2017-11-30 ENCOUNTER — Encounter: Payer: Self-pay | Admitting: Internal Medicine

## 2017-11-30 VITALS — BP 112/74 | HR 89 | Ht 64.5 in | Wt 173.0 lb

## 2017-11-30 DIAGNOSIS — Z953 Presence of xenogenic heart valve: Secondary | ICD-10-CM

## 2017-11-30 DIAGNOSIS — Z5181 Encounter for therapeutic drug level monitoring: Secondary | ICD-10-CM

## 2017-11-30 DIAGNOSIS — I4891 Unspecified atrial fibrillation: Secondary | ICD-10-CM | POA: Diagnosis not present

## 2017-11-30 DIAGNOSIS — I5032 Chronic diastolic (congestive) heart failure: Secondary | ICD-10-CM | POA: Diagnosis not present

## 2017-11-30 DIAGNOSIS — Q231 Congenital insufficiency of aortic valve: Secondary | ICD-10-CM

## 2017-11-30 DIAGNOSIS — I483 Typical atrial flutter: Secondary | ICD-10-CM

## 2017-11-30 DIAGNOSIS — I480 Paroxysmal atrial fibrillation: Secondary | ICD-10-CM | POA: Diagnosis not present

## 2017-11-30 DIAGNOSIS — I9789 Other postprocedural complications and disorders of the circulatory system, not elsewhere classified: Secondary | ICD-10-CM

## 2017-11-30 DIAGNOSIS — Z8679 Personal history of other diseases of the circulatory system: Secondary | ICD-10-CM | POA: Diagnosis not present

## 2017-11-30 DIAGNOSIS — I2510 Atherosclerotic heart disease of native coronary artery without angina pectoris: Secondary | ICD-10-CM | POA: Diagnosis not present

## 2017-11-30 LAB — POCT INR: INR: 2.8

## 2017-11-30 NOTE — Progress Notes (Signed)
Follow-up Outpatient Visit Date: 11/30/2017  Primary Care Provider: Reynold Bowen, [place] [address]  Chief Complaint: Back pain  HPI:  Becky Winters is a 77 y.o. 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  I last saw her in October.  Today, Becky Winters is most concerned about back pain that was exacerbated by shoveling snow and turning awkwardly while at church in December.  She was worried about a slipped disc and sought further care in the emergency department.  Unfortunately, she had a lengthy ED stay feels that her back pain was not well addressed.  Becky Winters has followed with her spine specialist, Dr. Vertell Limber, since then.  He has recommended back injections that Becky Winters is hesitant to undergo due to her chronic anticoagulation and has a tendency to be bridged with enoxaparin.  Becky Winters reports chronic exertional dyspnea as well as frequent fluttering in the chest.  This is about the same as at our last visit.  She denies chest pain, lightheadedness, and orthopnea.  She has intermittent edema that has been stable for years.  She notes that her colitis has improved with turmeric.  She remains on warfarin without bleeding.  She received a prescription from apixaban after our last visit but decided to throw it out after reading potential side effects of the medication.  She brings with her today a list of blood pressure and heart rate readings (she measures (sometimes checking her vitals multiple times a day).  Heart rate has been somewhat labile at times.  Blood pressure 96-136/59-77.  --------------------------------------------------------------------------------------------------  Cardiovascular History & Procedures: Cardiovascular Problems:  Bicuspid aortic valve status  post bioprosthetic AVR  Paroxysmal atrial fibrillation and flutter (intolerant of amiodarone)  TIA  Nonobstructive coronary artery disease  Risk Factors:  Nonobstructive CAD,TIA, hyperlipidemia, and age greater than 16  Cath/PCI:  LHC/RHC (05/25/14): LMCA normal. LAD normal. Relatively small LCx with 40% proximal stenosis. Dominant RCA without disease. RA 4, RV 26/4, PA 23/80 (14), PCWP 7. AO sat 97%, PA sat 81%. Fick CO 8.0, Fick CI 4.4.  CV Surgery:  Aortic valve replacement (06/07/14, Dr. Roxy Manns): [facility] Ease Pericardial Tissue Valve (size 23 mm, model # 3300TFX, serial # [id number])  EP Procedures and Devices:  30-day event monitor (08/02/15): Sinus rhythm without significant arrhythmias.  Non-Invasive Evaluation(s):  TTE (03/20/27): Normal LV size. LVEF 55-60% with normal wall motion and diastolic function. Bioprosthetic aortic valve present with mean gradient of 11 mmHg. Trivial MR. Mild TR and PR. Mildly dilated RV with normal contraction.  TTE (08/15/15): Normal LV size and wall thickness with LVEF of 55-60%. Normal wall motion. Grade 2 diastolic dysfunction. Bioprosthetic aortic valve present with mean gradient of 15 mmHg. Trace MR and TR. Normal RV size and function.  Recent CV Pertinent Labs: Lab Results  Component Value Date   CHOL 117 06/15/2017   HDL 41 06/15/2017   LDLCALC 54 06/15/2017   TRIG 112 06/15/2017   CHOLHDL 2.9 06/15/2017   CHOLHDL 3.1 03/12/2016   INR 2.8 11/30/2017   INR 1.75 08/10/2017   BNP 84.4 09/14/2015   K 3.4 (L) 08/10/2017   MG 2.0 06/08/2014   BUN 20 08/10/2017   BUN 19 06/15/2017   CREATININE 0.80 08/10/2017   CREATININE 1.17 (H) 03/12/2016    Past medical and surgical history were reviewed and updated in EPIC.  Current Meds  Medication Sig  . ALPRAZolam (XANAX) 0.25 MG tablet Take 1 tablet (0.25 mg total) by mouth daily as needed for sleep. (Patient taking differently: Take 0.25 mg by mouth at bedtime as needed for  sleep. Takes 1/2 tablet PRN)  . atorvastatin (LIPITOR) 40 MG tablet Take 20 mg by mouth at bedtime.   Marland Kitchen azithromycin (ZITHROMAX) 250 MG tablet Take 2 tablets (500 mg total) by mouth daily. 2 tabs 30-60 minutes before dental procedures  . b complex vitamins tablet Take 1 tablet by mouth daily.   . beta carotene w/minerals (OCUVITE) tablet Take 1 tablet by mouth 2 (two) times daily.   . Biotin 5000 MCG TABS Take 5,000 mcg by mouth 2 (two) times daily.   . cholecalciferol (VITAMIN D) 1000 UNITS tablet Take 2,000 Units by mouth 2 (two) times daily.   . Cinnamon 500 MG TABS Take 1 tablet by mouth 2 (two) times daily.   . Coenzyme Q10 (COQ-10) 100 MG CAPS Take 100 mg by mouth 2 (two) times daily.   . DiphenhydrAMINE HCl, Sleep, 50 MG CAPS Take 50 mg by mouth at bedtime.   . folic acid (FOLVITE) 557 MCG tablet Take 800 mcg by mouth daily.   Marland Kitchen levothyroxine (SYNTHROID, LEVOTHROID) 100 MCG tablet Take 100 mcg by mouth daily before breakfast.   . Melatonin 3 MG TABS Take 3 mg by mouth at bedtime.   . nitrofurantoin (MACRODANTIN) 100 MG capsule Take 100 mg by mouth at bedtime. UTI preventative  . NON FORMULARY Tumeric 500mg  BID  . OLIVE LEAF EXTRACT PO Take 500 mg by mouth 2 (two) times daily.   . Omega-3 Fatty Acids (FISH OIL PO) Take 1,400 mg by mouth 2 (two) times daily.  Marland Kitchen warfarin (JANTOVEN) 2.5 MG tablet Take as directed by CVRR    Allergies: Amiodarone; Clindamycin/lincomycin; Asacol [mesalamine]; Clindamycin; Codeine; Pentasa [mesalamine er]; Sulfa antibiotics; Amoxicillin; and Sulfamethoxazole  Social History   Socioeconomic History  . Marital status: Married    Spouse name: Not on file  . Number of children: Not on file  . Years of education: Not on file  . Highest education level: Not on file  Occupational History  . Not on file  Social Needs  . Financial resource strain: Not on file  . Food insecurity:    Worry: Not on file    Inability: Not on file  . Transportation needs:     Medical: Not on file    Non-medical: Not on file  Tobacco Use  . Smoking status: Never Smoker  . Smokeless tobacco: Never Used  Substance and Sexual Activity  . Alcohol use: No  . Drug use: No  . Sexual activity: Not on file  Lifestyle  . Physical activity:    Days per week: Not on file    Minutes per session: Not on file  . Stress: Not on file  Relationships  . Social connections:    Talks on phone: Not on file    Gets together: Not on file    Attends religious service: Not on file    Active member of club or organization: Not on file    Attends meetings of clubs or organizations: Not on file    Relationship status: Not on file  . Intimate partner violence:    Fear of current or ex partner: Not on file    Emotionally abused: Not on file    Physically abused: Not on file    Forced sexual activity: Not on  file  Other Topics Concern  . Not on file  Social History Narrative  . Not on file    Family History  Problem Relation Age of Onset  . CAD Father 20  . Heart attack Father   . Heart attack Paternal Grandfather   . Stroke Paternal Grandmother   . CVA Unknown   . Hypertension Neg Hx     Review of Systems: A 12-system review of systems was performed and was negative except as noted in the HPI.  --------------------------------------------------------------------------------------------------  Physical Exam: BP 112/74   Pulse 89   Ht 5' 4.5" (1.638 m)   Wt 173 lb (78.5 kg)   BMI 29.24 kg/m   General: NAD.  Accompanied by her husband. HEENT: No conjunctival pallor or scleral icterus. Moist mucous membranes.  OP clear. Neck: Supple without lymphadenopathy, thyromegaly, JVD, or HJR. Lungs: Normal work of breathing. Clear to auscultation bilaterally without wheezes or crackles. Heart: Irregular with 1/6 systolic murmur.  Nondisplaced PMI. Abd: Bowel sounds present. Soft, NT/ND without hepatosplenomegaly Ext: No lower extremity edema. Radial, PT, and DP pulses are  2+ bilaterally. Skin: Warm and dry without rash.  EKG: Atrial flutter with variable block and PVC versus aberrancy.  Nonspecific ST/T changes.  Lab Results  Component Value Date   WBC 13.4 (H) 08/10/2017   HGB 16.0 (H) 08/10/2017   HCT 47.0 (H) 08/10/2017   MCV 84.4 08/10/2017   PLT 254 08/10/2017    Lab Results  Component Value Date   NA 141 08/10/2017   K 3.4 (L) 08/10/2017   CL 107 08/10/2017   CO2 16 (L) 08/10/2017   BUN 20 08/10/2017   CREATININE 0.80 08/10/2017   GLUCOSE 104 (H) 08/10/2017   ALT 15 08/10/2017    Lab Results  Component Value Date   CHOL 117 06/15/2017   HDL 41 06/15/2017   LDLCALC 54 06/15/2017   TRIG 112 06/15/2017   CHOLHDL 2.9 06/15/2017    --------------------------------------------------------------------------------------------------  ASSESSMENT AND PLAN: Paroxysmal atrial fibrillation and atrial flutter EKG today is more consistent with flutter with variable block.  Becky Winters is stable from a symptom standpoint, though she complains of exertional dyspnea and frequent palpitations.  Ventricular rate control is reasonable today without any AV nodal blockade.  She was evaluated by Dr. Rayann Heman in the past but has not followed with him for several years.  Given that she is still bothered by the atrial fibrillation, I will refer her back to Dr. Rayann Heman to discuss potential rhythm control strategies.  We will continue with indefinite anticoagulation using warfarin.  Chronic diastolic heart failure Becky Winters appears euvolemic with NYHA class III heart failure symptoms.  No medication changes today.  Aortic stenosis status post bioprosthetic aortic valve No symptoms to suggest worsening valve function.  Continue current medications Axis procedures.  Coronary artery disease without angina No new symptoms to just worsening coronary insufficiency.  Continue medical therapy for mild to moderate, nonobstructive CAD noted on remote  catheterization.  Follow-up: Return to clinic in 1 year.  Nelva Bush, MD 12/01/2017 10:05 PM

## 2017-11-30 NOTE — Patient Instructions (Addendum)
Medication Instructions:  Your physician recommends that you continue on your current medications as directed. Please refer to the Current Medication list given to you today.  -- If you need a refill on your cardiac medications before your next appointment, please call your pharmacy. --  Labwork: None ordered  Testing/Procedures: None ordered  Follow-Up: Your physician wants you to follow-up in: 1 year with Dr. Saunders Revel.    You will receive a reminder letter in the mail two months in advance. If you don't receive a letter, please call our office to schedule the follow-up appointment.  Thank you for choosing [facility]!!    Any Other Special Instructions Will Be Listed Below (If Applicable).   Referral to Dr. Rayann Heman

## 2017-11-30 NOTE — Patient Instructions (Signed)
Description   Continue taking 1 tablet daily. Recheck INR in 4 weeks. Call with any questions. Coumadin Clinic [phone]

## 2017-12-01 ENCOUNTER — Encounter: Payer: Self-pay | Admitting: Internal Medicine

## 2017-12-07 DIAGNOSIS — H52203 Unspecified astigmatism, bilateral: Secondary | ICD-10-CM | POA: Diagnosis not present

## 2017-12-07 DIAGNOSIS — H2513 Age-related nuclear cataract, bilateral: Secondary | ICD-10-CM | POA: Diagnosis not present

## 2017-12-15 DIAGNOSIS — H04123 Dry eye syndrome of bilateral lacrimal glands: Secondary | ICD-10-CM | POA: Diagnosis not present

## 2017-12-23 ENCOUNTER — Encounter: Payer: Self-pay | Admitting: Internal Medicine

## 2017-12-24 ENCOUNTER — Encounter: Payer: Self-pay | Admitting: Internal Medicine

## 2017-12-24 ENCOUNTER — Encounter

## 2017-12-24 ENCOUNTER — Ambulatory Visit (INDEPENDENT_AMBULATORY_CARE_PROVIDER_SITE_OTHER): Payer: Medicare Other | Admitting: Internal Medicine

## 2017-12-24 VITALS — BP 122/80 | HR 62 | Ht 64.5 in | Wt 172.4 lb

## 2017-12-24 DIAGNOSIS — I483 Typical atrial flutter: Secondary | ICD-10-CM

## 2017-12-24 DIAGNOSIS — Z953 Presence of xenogenic heart valve: Secondary | ICD-10-CM | POA: Diagnosis not present

## 2017-12-24 DIAGNOSIS — I480 Paroxysmal atrial fibrillation: Secondary | ICD-10-CM | POA: Diagnosis not present

## 2017-12-24 NOTE — Patient Instructions (Signed)
Medication Instructions:  Your physician recommends that you continue on your current medications as directed. Please refer to the Current Medication list given to you today.  Labwork: None ordered.  Testing/Procedures: You will have a loop recorder implanted.  See below for additional information.  Follow-Up:  You will follow up with the device clinic 10-14 days after your procedure for a wound check.   Your physician wants you to follow-up in: 2 months with Dr. Rayann Heman.  Any Other Special Instructions Will Be Listed Below (If Applicable).  Loop Implant Instructions:  Please arrive at the [facility] main entrance of [facility] at:  6:30 am on Jan 07, 2018 You may eat or drink prior to procedure You may take any medications the morning of the procedure You will be discharged after your procedure  If you need a refill on your cardiac medications before your next appointment, please call your pharmacy.    Implantable Loop Recorder Placement An implantable loop recorder is a small electronic device that is placed under the skin of your chest. It is about the size of an AA ("double A") battery. The device records the electrical activity of your heart over a long period of time. Your health care provider can download these recordings to monitor your heart. You may need an implantable loop recorder if you have periods of abnormal heart activity (arrhythmias) or unexplained fainting (syncope) caused by a heart problem. Tell a health care provider about:  Any allergies you have.  All medicines you are taking, including vitamins, herbs, eye drops, creams, and over-the-counter medicines.  Any problems you or family members have had with anesthetic medicines.  Any blood disorders you have.  Any surgeries you have had.  Any medical conditions you have.  Whether you are pregnant or may be pregnant. What are the risks? Generally, this is a safe procedure. However, as with any  procedure, problems may occur, including:  Infection.  Bleeding.  Allergic reactions to anesthetic medicines.  Damage to nerves or blood vessels.  Failure of the device to work. This could require another surgery to replace it.  What happens before the procedure?   You may have a physical exam, blood tests, and imaging tests of your heart, such as a chest X-ray.  Follow instructions from your health care provider about eating or drinking restrictions.  Ask your health care provider about: ? Changing or stopping your regular medicines. This is especially important if you are taking diabetes medicines or blood thinners. ? Taking medicines such as aspirin and ibuprofen. These medicines can thin your blood. Do not take these medicines before your procedure if your surgeon instructs you not to.  Ask your health care provider how your surgical site will be marked or identified.  You may be given antibiotic medicine to help prevent infection.  Plan to have someone take you home after the procedure.  If you will be going home right after the procedure, plan to have someone with you for 24 hours.  Do not use any tobacco products, such as cigarettes, chewing tobacco, and e-cigarettes as told by your surgeon. If you need help quitting, ask your health care provider. What happens during the procedure?  To reduce your risk of infection: ? Your health care team will wash or sanitize their hands. ? Your skin will be washed with soap.  An IV tube will be inserted into one of your veins.  You may be given an antibiotic medicine through the IV tube.  You may be given one or more of the following: ? A medicine to help you relax (sedative). ? A medicine to numb the area (local anesthetic).  A small cut (incision) will be made on the left side of your upper chest.  A pocket will be created under your skin.  The device will be placed in the pocket.  The incision will be closed with  stitches (sutures) or adhesive strips.  A bandage (dressing) will be placed over the incision. The procedure may vary among health care providers and hospitals. What happens after the procedure?  Your blood pressure, heart rate, breathing rate, and blood oxygen level will be monitored often until the medicines you were given have worn off.  You may be able to go home on the day of your surgery. Before going home: ? Your health care provider will program your recorder. ? You will learn how to trigger your device with a handheld activator. ? You will learn how to send recordings to your health care provider. ? You will get an ID card for your device, and you will be told when to use it.  Do not drive for 24 hours if you received a sedative. This information is not intended to replace advice given to you by your health care provider. Make sure you discuss any questions you have with your health care provider. Document Released: 07/23/2015 Document Revised: 01/17/2016 Document Reviewed: 05/16/2015 Elsevier Interactive Patient Education  [employer].

## 2017-12-24 NOTE — H&P (View-Only) (Signed)
Electrophysiology Office Note   Date:  12/24/2017   ID:  Makhayla, Mcmurry [birth year], MRN [id number]  PCP:  Reynold Bowen, MD  Cardiologist:  Dr End Primary Electrophysiologist: Thompson Grayer, MD    Chief Complaint  Patient presents with  . Paroxysmal atrial fibrillation     History of Present Illness: Becky Winters is a 77 y.o. female who presents today for electrophysiology evaluation.   She is referred by Dr End for EP consultation regarding atrial arrhythmias.  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  She has a h/o paroxysmal atrial fibrillation post operatively.  Since that time, she has had ongoing palpitations but only typical atrial flutter documented.  She has been chronically anticoagulated with couamdin.  She has palpitations and fatigue.  She states that she feels "really bad" all of the time, though her perceived arrhythmia burden is actually very low.  Today, she denies symptoms of chest pain, shortness of breath, orthopnea, PND, lower extremity edema, claudication, dizziness, presyncope, syncope, bleeding, or neurologic sequela. The patient is tolerating medications without difficulties and is otherwise without complaint today.    Past Medical History:  Diagnosis Date  . Anxiety    conditional, taking in preparation for surgery   . 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 Bicuspid aortic valve 05/10/2014  . Chronic diastolic congestive heart failure ([place])   . Collagenous colitis   . Compression, esophagus 2013   stretched in the past   . Encounter for therapeutic drug monitoring 06/15/2014  . Exertional dyspnea 05/10/2014  . Foot swelling    - LEFT-ever since she had an injury as a child   . GERD (gastroesophageal reflux disease)    prevacid on occasion  . H/O hiatal hernia   . HLD (hyperlipidemia)   . Hx of echocardiogram    post AVR >> Echo (12/15):  Mild LVH, EF 55%, AVR ok  (Mean gradient (S): 10 mm Hg), mild MR, mild LAE  . Hypothyroidism   . Orthostatic hypotension 05/10/2014  . Postoperative atrial fibrillation ([place]) 06/09/2014  . 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/07/2014   23 mm [facility] Ease bovine pericardial tissue valve  . Severe aortic stenosis 05/21/2014   s/p AVR 05/2014  . TIA (transient ischemic attack)   . UTI (urinary tract infection)    frequent UTI-----pt. reports that she takes Cipro if needed, last UTI- July 2015   Past Surgical History:  Procedure Laterality Date  . AORTIC VALVE REPLACEMENT N/A 06/07/2014   Procedure: AORTIC VALVE REPLACEMENT  (AVR) with 20mm Aortic Perimount Magna Ease;  Surgeon: Rexene Alberts, MD;  Location: [place];  Service: Open Heart Surgery;  Laterality: N/A;  . APPENDECTOMY    . BLADDER SURGERY     x2 for tacking  . CARDIAC CATHETERIZATION    . cardiolite  2003, 2006  . CHOLECYSTECTOMY    . ESOPHAGEAL DILATION    . INTRAOPERATIVE TRANSESOPHAGEAL ECHOCARDIOGRAM N/A 06/07/2014   Procedure: INTRAOPERATIVE TRANSESOPHAGEAL ECHOCARDIOGRAM;  Surgeon: Rexene Alberts, MD;  Location: [place];  Service: Open Heart Surgery;  Laterality: N/A;  . LEFT AND RIGHT HEART CATHETERIZATION WITH CORONARY ANGIOGRAM N/A 05/25/2014   Procedure: LEFT AND RIGHT HEART CATHETERIZATION WITH CORONARY ANGIOGRAM;  Surgeon: Larey Dresser, MD;  Location: [facility] CATH LAB;  Service: Cardiovascular;  Laterality: N/A;  . NASAL SEPTUM SURGERY  1995  . THUMB ARTHROSCOPY  Left 2006   ? replacement of ligament   . TONSILLECTOMY    . TOTAL ABDOMINAL HYSTERECTOMY     PARTIAL     Current Outpatient Medications  Medication Sig Dispense Refill  . ALPRAZolam (XANAX) 0.25 MG tablet Take 1 tablet (0.25 mg total) by mouth daily as needed for sleep. (Patient taking differently: Take 0.25 mg by mouth at bedtime as needed for sleep. Takes 1/2 tablet PRN) 30 tablet 0  . atorvastatin (LIPITOR) 40 MG tablet Take 20 mg by mouth at bedtime.      Marland Kitchen azithromycin (ZITHROMAX) 250 MG tablet Take 2 tablets (500 mg total) by mouth daily. 2 tabs 30-60 minutes before dental procedures 6 each 1  . b complex vitamins tablet Take 1 tablet by mouth daily.     . beta carotene w/minerals (OCUVITE) tablet Take 1 tablet by mouth 2 (two) times daily.     . Biotin 5000 MCG TABS Take 5,000 mcg by mouth 2 (two) times daily.     . cholecalciferol (VITAMIN D) 1000 UNITS tablet Take 2,000 Units by mouth 2 (two) times daily.     . Cinnamon 500 MG TABS Take 1 tablet by mouth 2 (two) times daily.     . Coenzyme Q10 (COQ-10) 100 MG CAPS Take 100 mg by mouth 2 (two) times daily.     . DiphenhydrAMINE HCl, Sleep, 50 MG CAPS Take 50 mg by mouth at bedtime.     . folic acid (FOLVITE) 833 MCG tablet Take 800 mcg by mouth daily.     Marland Kitchen levothyroxine (SYNTHROID, LEVOTHROID) 100 MCG tablet Take 100 mcg by mouth daily before breakfast.     . Melatonin 3 MG TABS Take 3 mg by mouth at bedtime.     . nitrofurantoin, macrocrystal-monohydrate, (MACROBID) 100 MG capsule Take 1 capsule by mouth daily.    . NON FORMULARY Tumeric 500mg  BID    . OLIVE LEAF EXTRACT PO Take 500 mg by mouth 2 (two) times daily.     . Omega-3 Fatty Acids (FISH OIL PO) Take 1,400 mg by mouth 2 (two) times daily.    . prednisoLONE acetate (PRED FORTE) 1 % ophthalmic suspension Place 1 drop into both eyes 2 (two) times daily.    Marland Kitchen warfarin (JANTOVEN) 2.5 MG tablet Take as directed by CVRR 105 tablet 1   No current facility-administered medications for this visit.     Allergies:   Amiodarone; Clindamycin/lincomycin; Asacol [mesalamine]; Clindamycin; Codeine; Pentasa [mesalamine er]; Sulfa antibiotics; Amoxicillin; and Sulfamethoxazole   Social History:  The patient  reports that she has never smoked. She has never used smokeless tobacco. She reports that she does not drink alcohol or use drugs.   Family History:  The patient's  family history includes CAD (age of onset: 69) in her father; CVA in her  unknown relative; Heart attack in her father and paternal grandfather; Stroke in her paternal grandmother.    ROS:  Please see the history of present illness.   All other systems are personally reviewed and negative.    PHYSICAL EXAM: VS:  BP 122/80   Pulse 62   Ht 5' 4.5" (1.638 m)   Wt 172 lb 6.4 oz (78.2 kg)   BMI 29.14 kg/m  , BMI Body mass index is 29.14 kg/m. GEN: Well nourished, well developed, in no acute distress  HEENT: normal  Neck: no JVD, carotid bruits, or masses Cardiac: RRR; no murmurs, rubs, or gallops,no edema  Respiratory:  clear to auscultation bilaterally, normal  work of breathing GI: soft, nontender, nondistended, + BS MS: no deformity or atrophy  Skin: warm and dry  Neuro:  Strength and sensation are intact Psych: euthymic mood, full affect  EKG:  EKG is ordered today. The ekg ordered today is personally reviewed and shows sinus rhythm 62 bpm, PR 148 msec, QRS 84 msec, Qtc 462 msec, otherwise normal ekg  ekg 11/30/17 reveals typical appearing atrial flutter   Recent Labs: 08/10/2017: ALT 15; BUN 20; Creatinine, Ser 0.80; Hemoglobin 16.0; Platelets 254; Potassium 3.4; Sodium 141  personally reviewed   Lipid Panel     Component Value Date/Time   CHOL 117 06/15/2017 0845   TRIG 112 06/15/2017 0845   HDL 41 06/15/2017 0845   CHOLHDL 2.9 06/15/2017 0845   CHOLHDL 3.1 03/12/2016 1447   VLDL 27 03/12/2016 1447   LDLCALC 54 06/15/2017 0845   personally reviewed   Wt Readings from Last 3 Encounters:  12/24/17 172 lb 6.4 oz (78.2 kg)  11/30/17 173 lb (78.5 kg)  06/15/17 169 lb 12.8 oz (77 kg)      Other studies personally reviewed: Additional studies/ records that were reviewed today include: Dr Darnelle Bos notes, my prior consult note, echo 03/19/17 reveals EF 55%,  LA 36mm Review of the above records today demonstrates: as above   ASSESSMENT AND PLAN:  1.  Paroxysmal atrial fibrillation/ typical atrial flutter/ palpitations I think that (as with my  prior consult note), I continue to feel that her symptoms are out of proportion to her arrhythmia burden.  She has not had afib documented since recovering from valvular surgery.  She has had atrial flutter documented on at least 2 different occasions.  She has also worn an event monitor during which she had symptoms but no arrhythmia detected.  She has tried amiodarone previously but did not tolerate this medicine.  She wishes to consider ablation. I think that before we proceed with ablation, we should better characterize the nature and frequency of her atrial arrhythmia.  I would therefore recommend additional monitoring with an implantable loop recorder to better characterize her palpitations and for AF management.   If she has only atrial flutter documented, then perhaps we could perform CTI ablation.  If she has both afib and atrial flutter documented then we would consider a different AAD vs ablation.  If she has no arrhythmias to explain her palpitations or symptoms then perhaps a more conservative approach would be best. chads2vasc score is 5.  She will likely require long term anticoagulation given prior TIA. Risks of ILR discussed with the patient who wishes to proceed.  Follow-up:  Return to see me 6 weeks after ILR implant to further evaluate her symptoms as above  Current medicines are reviewed at length with the patient today.   The patient does not have concerns regarding her medicines.  The following changes were made today:  none   Signed, Thompson Grayer, MD  12/24/2017 11:58 AM     [facility] [address] [place] [address] [phone] (office) [phone] (fax)

## 2017-12-24 NOTE — Progress Notes (Signed)
Electrophysiology Office Note   Date:  12/24/2017   ID:  Bradi, Arbuthnot 06-15-1941, MRN [id number]  PCP:  Reynold Bowen, MD  Cardiologist:  Dr End Primary Electrophysiologist: Thompson Grayer, MD    Chief Complaint  Patient presents with  . Paroxysmal atrial fibrillation     History of Present Illness: Becky Winters is a 77 y.o. female who presents today for electrophysiology evaluation.   She is referred by Dr End for EP consultation regarding atrial arrhythmias.  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  She has a h/o paroxysmal atrial fibrillation post operatively.  Since that time, she has had ongoing palpitations but only typical atrial flutter documented.  She has been chronically anticoagulated with couamdin.  She has palpitations and fatigue.  She states that she feels "really bad" all of the time, though her perceived arrhythmia burden is actually very low.  Today, she denies symptoms of chest pain, shortness of breath, orthopnea, PND, lower extremity edema, claudication, dizziness, presyncope, syncope, bleeding, or neurologic sequela. The patient is tolerating medications without difficulties and is otherwise without complaint today.    Past Medical History:  Diagnosis Date  . Anxiety    conditional, taking in preparation for surgery   . 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 Bicuspid aortic valve 05/10/2014  . Chronic diastolic congestive heart failure ([place])   . Collagenous colitis   . Compression, esophagus 2013   stretched in the past   . Encounter for therapeutic drug monitoring 06/15/2014  . Exertional dyspnea 05/10/2014  . Foot swelling    - LEFT-ever since she had an injury as a child   . GERD (gastroesophageal reflux disease)    prevacid on occasion  . H/O hiatal hernia   . HLD (hyperlipidemia)   . Hx of echocardiogram    post AVR >> Echo (12/15):  Mild LVH, EF 55%, AVR ok  (Mean gradient (S): 10 mm Hg), mild MR, mild LAE  . Hypothyroidism   . Orthostatic hypotension 05/10/2014  . Postoperative atrial fibrillation ([place]) 06/09/2014  . 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/07/2014   23 mm [facility] Ease bovine pericardial tissue valve  . Severe aortic stenosis 05/21/2014   s/p AVR 05/2014  . TIA (transient ischemic attack)   . UTI (urinary tract infection)    frequent UTI-----pt. reports that she takes Cipro if needed, last UTI- July 2015   Past Surgical History:  Procedure Laterality Date  . AORTIC VALVE REPLACEMENT N/A 06/07/2014   Procedure: AORTIC VALVE REPLACEMENT  (AVR) with 64mm Aortic Perimount Magna Ease;  Surgeon: Rexene Alberts, MD;  Location: [place];  Service: Open Heart Surgery;  Laterality: N/A;  . APPENDECTOMY    . BLADDER SURGERY     x2 for tacking  . CARDIAC CATHETERIZATION    . cardiolite  2003, 2006  . CHOLECYSTECTOMY    . ESOPHAGEAL DILATION    . INTRAOPERATIVE TRANSESOPHAGEAL ECHOCARDIOGRAM N/A 06/07/2014   Procedure: INTRAOPERATIVE TRANSESOPHAGEAL ECHOCARDIOGRAM;  Surgeon: Rexene Alberts, MD;  Location: [place];  Service: Open Heart Surgery;  Laterality: N/A;  . LEFT AND RIGHT HEART CATHETERIZATION WITH CORONARY ANGIOGRAM N/A 05/25/2014   Procedure: LEFT AND RIGHT HEART CATHETERIZATION WITH CORONARY ANGIOGRAM;  Surgeon: Larey Dresser, MD;  Location: [facility] CATH LAB;  Service: Cardiovascular;  Laterality: N/A;  . NASAL SEPTUM SURGERY  1995  . THUMB ARTHROSCOPY  Left 2006   ? replacement of ligament   . TONSILLECTOMY    . TOTAL ABDOMINAL HYSTERECTOMY     PARTIAL     Current Outpatient Medications  Medication Sig Dispense Refill  . ALPRAZolam (XANAX) 0.25 MG tablet Take 1 tablet (0.25 mg total) by mouth daily as needed for sleep. (Patient taking differently: Take 0.25 mg by mouth at bedtime as needed for sleep. Takes 1/2 tablet PRN) 30 tablet 0  . atorvastatin (LIPITOR) 40 MG tablet Take 20 mg by mouth at bedtime.      Marland Kitchen azithromycin (ZITHROMAX) 250 MG tablet Take 2 tablets (500 mg total) by mouth daily. 2 tabs 30-60 minutes before dental procedures 6 each 1  . b complex vitamins tablet Take 1 tablet by mouth daily.     . beta carotene w/minerals (OCUVITE) tablet Take 1 tablet by mouth 2 (two) times daily.     . Biotin 5000 MCG TABS Take 5,000 mcg by mouth 2 (two) times daily.     . cholecalciferol (VITAMIN D) 1000 UNITS tablet Take 2,000 Units by mouth 2 (two) times daily.     . Cinnamon 500 MG TABS Take 1 tablet by mouth 2 (two) times daily.     . Coenzyme Q10 (COQ-10) 100 MG CAPS Take 100 mg by mouth 2 (two) times daily.     . DiphenhydrAMINE HCl, Sleep, 50 MG CAPS Take 50 mg by mouth at bedtime.     . folic acid (FOLVITE) 371 MCG tablet Take 800 mcg by mouth daily.     Marland Kitchen levothyroxine (SYNTHROID, LEVOTHROID) 100 MCG tablet Take 100 mcg by mouth daily before breakfast.     . Melatonin 3 MG TABS Take 3 mg by mouth at bedtime.     . nitrofurantoin, macrocrystal-monohydrate, (MACROBID) 100 MG capsule Take 1 capsule by mouth daily.    . NON FORMULARY Tumeric 500mg  BID    . OLIVE LEAF EXTRACT PO Take 500 mg by mouth 2 (two) times daily.     . Omega-3 Fatty Acids (FISH OIL PO) Take 1,400 mg by mouth 2 (two) times daily.    . prednisoLONE acetate (PRED FORTE) 1 % ophthalmic suspension Place 1 drop into both eyes 2 (two) times daily.    Marland Kitchen warfarin (JANTOVEN) 2.5 MG tablet Take as directed by CVRR 105 tablet 1   No current facility-administered medications for this visit.     Allergies:   Amiodarone; Clindamycin/lincomycin; Asacol [mesalamine]; Clindamycin; Codeine; Pentasa [mesalamine er]; Sulfa antibiotics; Amoxicillin; and Sulfamethoxazole   Social History:  The patient  reports that she has never smoked. She has never used smokeless tobacco. She reports that she does not drink alcohol or use drugs.   Family History:  The patient's  family history includes CAD (age of onset: 68) in her father; CVA in her  unknown relative; Heart attack in her father and paternal grandfather; Stroke in her paternal grandmother.    ROS:  Please see the history of present illness.   All other systems are personally reviewed and negative.    PHYSICAL EXAM: VS:  BP 122/80   Pulse 62   Ht 5' 4.5" (1.638 m)   Wt 172 lb 6.4 oz (78.2 kg)   BMI 29.14 kg/m  , BMI Body mass index is 29.14 kg/m. GEN: Well nourished, well developed, in no acute distress  HEENT: normal  Neck: no JVD, carotid bruits, or masses Cardiac: RRR; no murmurs, rubs, or gallops,no edema  Respiratory:  clear to auscultation bilaterally, normal  work of breathing GI: soft, nontender, nondistended, + BS MS: no deformity or atrophy  Skin: warm and dry  Neuro:  Strength and sensation are intact Psych: euthymic mood, full affect  EKG:  EKG is ordered today. The ekg ordered today is personally reviewed and shows sinus rhythm 62 bpm, PR 148 msec, QRS 84 msec, Qtc 462 msec, otherwise normal ekg  ekg 11/30/17 reveals typical appearing atrial flutter   Recent Labs: 08/10/2017: ALT 15; BUN 20; Creatinine, Ser 0.80; Hemoglobin 16.0; Platelets 254; Potassium 3.4; Sodium 141  personally reviewed   Lipid Panel     Component Value Date/Time   CHOL 117 06/15/2017 0845   TRIG 112 06/15/2017 0845   HDL 41 06/15/2017 0845   CHOLHDL 2.9 06/15/2017 0845   CHOLHDL 3.1 03/12/2016 1447   VLDL 27 03/12/2016 1447   LDLCALC 54 06/15/2017 0845   personally reviewed   Wt Readings from Last 3 Encounters:  12/24/17 172 lb 6.4 oz (78.2 kg)  11/30/17 173 lb (78.5 kg)  06/15/17 169 lb 12.8 oz (77 kg)      Other studies personally reviewed: Additional studies/ records that were reviewed today include: Dr Darnelle Bos notes, my prior consult note, echo 03/19/17 reveals EF 55%,  LA 4mm Review of the above records today demonstrates: as above   ASSESSMENT AND PLAN:  1.  Paroxysmal atrial fibrillation/ typical atrial flutter/ palpitations I think that (as with my  prior consult note), I continue to feel that her symptoms are out of proportion to her arrhythmia burden.  She has not had afib documented since recovering from valvular surgery.  She has had atrial flutter documented on at least 2 different occasions.  She has also worn an event monitor during which she had symptoms but no arrhythmia detected.  She has tried amiodarone previously but did not tolerate this medicine.  She wishes to consider ablation. I think that before we proceed with ablation, we should better characterize the nature and frequency of her atrial arrhythmia.  I would therefore recommend additional monitoring with an implantable loop recorder to better characterize her palpitations and for AF management.   If she has only atrial flutter documented, then perhaps we could perform CTI ablation.  If she has both afib and atrial flutter documented then we would consider a different AAD vs ablation.  If she has no arrhythmias to explain her palpitations or symptoms then perhaps a more conservative approach would be best. chads2vasc score is 5.  She will likely require long term anticoagulation given prior TIA. Risks of ILR discussed with the patient who wishes to proceed.  Follow-up:  Return to see me 6 weeks after ILR implant to further evaluate her symptoms as above  Current medicines are reviewed at length with the patient today.   The patient does not have concerns regarding her medicines.  The following changes were made today:  none   Signed, Thompson Grayer, MD  12/24/2017 11:58 AM     [facility] [address] [place] [address] [phone] (office) [phone] (fax)

## 2017-12-29 ENCOUNTER — Ambulatory Visit (INDEPENDENT_AMBULATORY_CARE_PROVIDER_SITE_OTHER): Payer: Medicare Other

## 2017-12-29 DIAGNOSIS — Q231 Congenital insufficiency of aortic valve: Secondary | ICD-10-CM | POA: Diagnosis not present

## 2017-12-29 DIAGNOSIS — Z5181 Encounter for therapeutic drug level monitoring: Secondary | ICD-10-CM | POA: Diagnosis not present

## 2017-12-29 DIAGNOSIS — Z953 Presence of xenogenic heart valve: Secondary | ICD-10-CM | POA: Diagnosis not present

## 2017-12-29 DIAGNOSIS — I4891 Unspecified atrial fibrillation: Secondary | ICD-10-CM | POA: Diagnosis not present

## 2017-12-29 DIAGNOSIS — I9789 Other postprocedural complications and disorders of the circulatory system, not elsewhere classified: Secondary | ICD-10-CM

## 2017-12-29 LAB — POCT INR: INR: 2.7

## 2017-12-29 NOTE — Patient Instructions (Signed)
Description   Continue taking 1 tablet daily. Recheck INR in 4 weeks. Call with any questions. Coumadin Clinic [phone]

## 2018-01-05 DIAGNOSIS — H2513 Age-related nuclear cataract, bilateral: Secondary | ICD-10-CM | POA: Diagnosis not present

## 2018-01-05 DIAGNOSIS — H04123 Dry eye syndrome of bilateral lacrimal glands: Secondary | ICD-10-CM | POA: Diagnosis not present

## 2018-01-07 ENCOUNTER — Encounter (HOSPITAL_COMMUNITY): Payer: Self-pay | Admitting: *Deleted

## 2018-01-07 ENCOUNTER — Ambulatory Visit (HOSPITAL_COMMUNITY)
Admission: RE | Admit: 2018-01-07 | Discharge: 2018-01-07 | Disposition: A | Discharge status: Home / Self Care | Payer: Medicare Other | Source: Ambulatory Visit | Attending: Internal Medicine | Admitting: Internal Medicine

## 2018-01-07 ENCOUNTER — Encounter (HOSPITAL_COMMUNITY)
Admission: RE | Disposition: A | Discharge status: Home / Self Care | Payer: Self-pay | Source: Ambulatory Visit | Attending: Internal Medicine

## 2018-01-07 DIAGNOSIS — M1990 Unspecified osteoarthritis, unspecified site: Secondary | ICD-10-CM | POA: Diagnosis not present

## 2018-01-07 DIAGNOSIS — Z9889 Other specified postprocedural states: Secondary | ICD-10-CM | POA: Insufficient documentation

## 2018-01-07 DIAGNOSIS — Z953 Presence of xenogenic heart valve: Secondary | ICD-10-CM | POA: Insufficient documentation

## 2018-01-07 DIAGNOSIS — Z9049 Acquired absence of other specified parts of digestive tract: Secondary | ICD-10-CM | POA: Insufficient documentation

## 2018-01-07 DIAGNOSIS — Z8673 Personal history of transient ischemic attack (TIA), and cerebral infarction without residual deficits: Secondary | ICD-10-CM | POA: Diagnosis not present

## 2018-01-07 DIAGNOSIS — Z87440 Personal history of urinary (tract) infections: Secondary | ICD-10-CM | POA: Diagnosis not present

## 2018-01-07 DIAGNOSIS — I480 Paroxysmal atrial fibrillation: Secondary | ICD-10-CM | POA: Diagnosis not present

## 2018-01-07 DIAGNOSIS — Z888 Allergy status to other drugs, medicaments and biological substances status: Secondary | ICD-10-CM | POA: Diagnosis not present

## 2018-01-07 DIAGNOSIS — Z880 Allergy status to penicillin: Secondary | ICD-10-CM | POA: Diagnosis not present

## 2018-01-07 DIAGNOSIS — F419 Anxiety disorder, unspecified: Secondary | ICD-10-CM | POA: Diagnosis not present

## 2018-01-07 DIAGNOSIS — I5032 Chronic diastolic (congestive) heart failure: Secondary | ICD-10-CM | POA: Insufficient documentation

## 2018-01-07 DIAGNOSIS — Z79890 Hormone replacement therapy (postmenopausal): Secondary | ICD-10-CM | POA: Insufficient documentation

## 2018-01-07 DIAGNOSIS — E039 Hypothyroidism, unspecified: Secondary | ICD-10-CM | POA: Diagnosis not present

## 2018-01-07 DIAGNOSIS — Z90711 Acquired absence of uterus with remaining cervical stump: Secondary | ICD-10-CM | POA: Diagnosis not present

## 2018-01-07 DIAGNOSIS — Z79899 Other long term (current) drug therapy: Secondary | ICD-10-CM | POA: Diagnosis not present

## 2018-01-07 DIAGNOSIS — Z885 Allergy status to narcotic agent status: Secondary | ICD-10-CM | POA: Diagnosis not present

## 2018-01-07 DIAGNOSIS — I4891 Unspecified atrial fibrillation: Secondary | ICD-10-CM | POA: Diagnosis not present

## 2018-01-07 DIAGNOSIS — Z882 Allergy status to sulfonamides status: Secondary | ICD-10-CM | POA: Diagnosis not present

## 2018-01-07 DIAGNOSIS — Z881 Allergy status to other antibiotic agents status: Secondary | ICD-10-CM | POA: Insufficient documentation

## 2018-01-07 DIAGNOSIS — Z7901 Long term (current) use of anticoagulants: Secondary | ICD-10-CM | POA: Insufficient documentation

## 2018-01-07 DIAGNOSIS — Z823 Family history of stroke: Secondary | ICD-10-CM | POA: Insufficient documentation

## 2018-01-07 DIAGNOSIS — R002 Palpitations: Secondary | ICD-10-CM | POA: Diagnosis not present

## 2018-01-07 DIAGNOSIS — Z8249 Family history of ischemic heart disease and other diseases of the circulatory system: Secondary | ICD-10-CM | POA: Diagnosis not present

## 2018-01-07 DIAGNOSIS — E785 Hyperlipidemia, unspecified: Secondary | ICD-10-CM | POA: Insufficient documentation

## 2018-01-07 HISTORY — PX: LOOP RECORDER INSERTION: EP1214

## 2018-01-07 SURGERY — LOOP RECORDER INSERTION

## 2018-01-07 MED ORDER — LIDOCAINE-EPINEPHRINE 1 %-1:100000 IJ SOLN
INTRAMUSCULAR | Status: AC
  Filled 2018-01-07: qty 1

## 2018-01-07 MED ORDER — LIDOCAINE-EPINEPHRINE 1 %-1:100000 IJ SOLN
INTRAMUSCULAR | Status: DC | PRN
  Administered 2018-01-07: 20 mL

## 2018-01-07 SURGICAL SUPPLY — 2 items
LOOP REVEAL LINQSYS (Prosthesis & Implant Heart) ×3 IMPLANT
PACK LOOP INSERTION (CUSTOM PROCEDURE TRAY) ×3 IMPLANT

## 2018-01-07 NOTE — Interval H&P Note (Signed)
History and Physical Interval Note:  01/07/2018 7:33 AM  Becky Winters  has presented today for surgery, with the diagnosis of palp - afib  The various methods of treatment have been discussed with the patient and family. After consideration of risks, benefits and other options for treatment, the patient has consented to  Procedure(s): LOOP RECORDER INSERTION (N/A) as a surgical intervention .  The patient's history has been reviewed, patient examined, no change in status, stable for surgery.  I have reviewed the patient's chart and labs.  Questions were answered to the patient's satisfaction.     Thompson Grayer

## 2018-01-07 NOTE — Discharge Instructions (Signed)
Handout given to pt and husband and reviwed with each/ Both watched Loop video and spoke with [employer] rep.  See pt chart

## 2018-01-15 ENCOUNTER — Ambulatory Visit: Payer: Medicare Other | Admitting: Internal Medicine

## 2018-01-19 ENCOUNTER — Ambulatory Visit (INDEPENDENT_AMBULATORY_CARE_PROVIDER_SITE_OTHER): Payer: Self-pay | Admitting: *Deleted

## 2018-01-19 DIAGNOSIS — I483 Typical atrial flutter: Secondary | ICD-10-CM

## 2018-01-19 DIAGNOSIS — R002 Palpitations: Secondary | ICD-10-CM

## 2018-01-19 LAB — CUP PACEART INCLINIC DEVICE CHECK
Date Time Interrogation Session: 20190528171447
MDC IDC PG IMPLANT DT: 20190516

## 2018-01-19 NOTE — Progress Notes (Signed)
Wound check appointment. Steri-strips removed. Wound without redness or edema. Incision edges approximated, wound well healed. Normal device function. Battery status: good. R-waves 0.16mV. No tachy or AF episodes, +warfarin. Pause and brady detection off at implant. 2 symptom episodes--ECG from 5/16 is from post-implant education. ECG from 5/25 shows SR, patient reported feeling "arm heaviness." Patient educated about wound care, symptom activator, and Carelink monitor. Monthly summary reports and ROV with JA on 03/22/18.

## 2018-01-21 DIAGNOSIS — H2511 Age-related nuclear cataract, right eye: Secondary | ICD-10-CM | POA: Diagnosis not present

## 2018-01-21 DIAGNOSIS — H25811 Combined forms of age-related cataract, right eye: Secondary | ICD-10-CM | POA: Diagnosis not present

## 2018-01-26 ENCOUNTER — Ambulatory Visit (INDEPENDENT_AMBULATORY_CARE_PROVIDER_SITE_OTHER): Payer: Medicare Other | Admitting: *Deleted

## 2018-01-26 DIAGNOSIS — I4891 Unspecified atrial fibrillation: Secondary | ICD-10-CM | POA: Diagnosis not present

## 2018-01-26 DIAGNOSIS — Z953 Presence of xenogenic heart valve: Secondary | ICD-10-CM | POA: Diagnosis not present

## 2018-01-26 DIAGNOSIS — Q231 Congenital insufficiency of aortic valve: Secondary | ICD-10-CM

## 2018-01-26 DIAGNOSIS — Z5181 Encounter for therapeutic drug level monitoring: Secondary | ICD-10-CM | POA: Diagnosis not present

## 2018-01-26 DIAGNOSIS — I9789 Other postprocedural complications and disorders of the circulatory system, not elsewhere classified: Secondary | ICD-10-CM | POA: Diagnosis not present

## 2018-01-26 DIAGNOSIS — Q2381 Bicuspid aortic valve: Secondary | ICD-10-CM

## 2018-01-26 LAB — POCT INR: INR: 2.6 (ref 2.0–3.0)

## 2018-01-26 NOTE — Patient Instructions (Addendum)
Description   Continue taking 1 tablet daily. Recheck INR in 6 weeks. Call with any questions. Coumadin Clinic [phone]    

## 2018-02-09 ENCOUNTER — Ambulatory Visit (INDEPENDENT_AMBULATORY_CARE_PROVIDER_SITE_OTHER): Payer: Medicare Other | Admitting: *Deleted

## 2018-02-09 ENCOUNTER — Encounter: Payer: Self-pay | Admitting: Internal Medicine

## 2018-02-09 DIAGNOSIS — R002 Palpitations: Secondary | ICD-10-CM

## 2018-02-09 DIAGNOSIS — I480 Paroxysmal atrial fibrillation: Secondary | ICD-10-CM

## 2018-02-09 NOTE — Progress Notes (Signed)
Carelink Summary Report / Loop Recorder 

## 2018-02-11 DIAGNOSIS — H25812 Combined forms of age-related cataract, left eye: Secondary | ICD-10-CM | POA: Diagnosis not present

## 2018-02-11 DIAGNOSIS — H2512 Age-related nuclear cataract, left eye: Secondary | ICD-10-CM | POA: Diagnosis not present

## 2018-02-17 ENCOUNTER — Ambulatory Visit: Payer: Medicare Other | Admitting: Internal Medicine

## 2018-03-09 ENCOUNTER — Ambulatory Visit (INDEPENDENT_AMBULATORY_CARE_PROVIDER_SITE_OTHER): Payer: Medicare Other

## 2018-03-09 DIAGNOSIS — I4891 Unspecified atrial fibrillation: Secondary | ICD-10-CM | POA: Diagnosis not present

## 2018-03-09 DIAGNOSIS — Z953 Presence of xenogenic heart valve: Secondary | ICD-10-CM

## 2018-03-09 DIAGNOSIS — I9789 Other postprocedural complications and disorders of the circulatory system, not elsewhere classified: Secondary | ICD-10-CM | POA: Diagnosis not present

## 2018-03-09 DIAGNOSIS — Q2381 Bicuspid aortic valve: Secondary | ICD-10-CM

## 2018-03-09 DIAGNOSIS — Q231 Congenital insufficiency of aortic valve: Secondary | ICD-10-CM | POA: Diagnosis not present

## 2018-03-09 DIAGNOSIS — Z5181 Encounter for therapeutic drug level monitoring: Secondary | ICD-10-CM

## 2018-03-09 LAB — POCT INR: INR: 2.8 (ref 2.0–3.0)

## 2018-03-09 NOTE — Patient Instructions (Signed)
Description   Continue taking 1 tablet daily. Recheck INR in 6 weeks. Call with any questions. Coumadin Clinic [phone]    

## 2018-03-12 ENCOUNTER — Encounter: Payer: Self-pay | Admitting: Internal Medicine

## 2018-03-12 ENCOUNTER — Ambulatory Visit (INDEPENDENT_AMBULATORY_CARE_PROVIDER_SITE_OTHER): Payer: Medicare Other | Admitting: *Deleted

## 2018-03-12 DIAGNOSIS — I480 Paroxysmal atrial fibrillation: Secondary | ICD-10-CM | POA: Diagnosis not present

## 2018-03-15 NOTE — Progress Notes (Signed)
Carelink Summary Report / Loop Recorder 

## 2018-03-17 LAB — CUP PACEART REMOTE DEVICE CHECK
Date Time Interrogation Session: 20190618121126
Implantable Pulse Generator Implant Date: 20190516

## 2018-03-22 ENCOUNTER — Ambulatory Visit (INDEPENDENT_AMBULATORY_CARE_PROVIDER_SITE_OTHER): Payer: Medicare Other | Admitting: Internal Medicine

## 2018-03-22 ENCOUNTER — Encounter: Payer: Self-pay | Admitting: Internal Medicine

## 2018-03-22 VITALS — BP 120/60 | HR 65 | Ht 64.5 in | Wt 169.0 lb

## 2018-03-22 DIAGNOSIS — R002 Palpitations: Secondary | ICD-10-CM | POA: Diagnosis not present

## 2018-03-22 DIAGNOSIS — I2510 Atherosclerotic heart disease of native coronary artery without angina pectoris: Secondary | ICD-10-CM | POA: Diagnosis not present

## 2018-03-22 DIAGNOSIS — I483 Typical atrial flutter: Secondary | ICD-10-CM

## 2018-03-22 DIAGNOSIS — I480 Paroxysmal atrial fibrillation: Secondary | ICD-10-CM | POA: Diagnosis not present

## 2018-03-22 LAB — CUP PACEART INCLINIC DEVICE CHECK
Implantable Pulse Generator Implant Date: 20190516
MDC IDC SESS DTM: 20190729144624

## 2018-03-22 NOTE — Progress Notes (Signed)
PCP: Reynold Bowen, MD Primary Cardiologist: Dr End Primary EP: Dr Rolanda Lundborg is a 77 y.o. female who presents today for routine electrophysiology followup.  Since last being seen in our clinic, the patient reports doing very well.  Today, she denies symptoms of palpitations, chest pain, shortness of breath,  lower extremity edema, dizziness, presyncope, or syncope.  The patient is otherwise without complaint today.   Past Medical History:  Diagnosis Date  . Anxiety    conditional, taking in preparation for surgery   . 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 Bicuspid aortic valve 05/10/2014  . Chronic diastolic congestive heart failure ([place])   . Collagenous colitis   . Compression, esophagus 2013   stretched in the past   . Encounter for therapeutic drug monitoring 06/15/2014  . Exertional dyspnea 05/10/2014  . Foot swelling    - LEFT-ever since she had an injury as a child   . GERD (gastroesophageal reflux disease)    prevacid on occasion  . H/O hiatal hernia   . HLD (hyperlipidemia)   . Hx of echocardiogram    post AVR >> Echo (12/15):  Mild LVH, EF 55%, AVR ok (Mean gradient (S): 10 mm Hg), mild MR, mild LAE  . Hypothyroidism   . Orthostatic hypotension 05/10/2014  . Postoperative atrial fibrillation ([place]) 06/09/2014  . 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/07/2014   23 mm [facility] Ease bovine pericardial tissue valve  . Severe aortic stenosis 05/21/2014   s/p AVR 05/2014  . TIA (transient ischemic attack)   . UTI (urinary tract infection)    frequent UTI-----pt. reports that she takes Cipro if needed, last UTI- July 2015   Past Surgical History:  Procedure Laterality Date  . AORTIC VALVE REPLACEMENT N/A 06/07/2014   Procedure: AORTIC VALVE REPLACEMENT  (AVR) with 90mm Aortic Perimount Magna Ease;  Surgeon: Rexene Alberts, MD;  Location: [place];  Service: Open Heart Surgery;  Laterality:  N/A;  . APPENDECTOMY    . BLADDER SURGERY     x2 for tacking  . CARDIAC CATHETERIZATION    . cardiolite  2003, 2006  . CHOLECYSTECTOMY    . ESOPHAGEAL DILATION    . INTRAOPERATIVE TRANSESOPHAGEAL ECHOCARDIOGRAM N/A 06/07/2014   Procedure: INTRAOPERATIVE TRANSESOPHAGEAL ECHOCARDIOGRAM;  Surgeon: Rexene Alberts, MD;  Location: [place];  Service: Open Heart Surgery;  Laterality: N/A;  . LEFT AND RIGHT HEART CATHETERIZATION WITH CORONARY ANGIOGRAM N/A 05/25/2014   Procedure: LEFT AND RIGHT HEART CATHETERIZATION WITH CORONARY ANGIOGRAM;  Surgeon: Larey Dresser, MD;  Location: [facility] CATH LAB;  Service: Cardiovascular;  Laterality: N/A;  . LOOP RECORDER INSERTION N/A 01/07/2018   Procedure: LOOP RECORDER INSERTION;  Surgeon: Thompson Grayer, MD;  Location: [place] CV LAB;  Service: Cardiovascular;  Laterality: N/A;  . NASAL SEPTUM SURGERY  1995  . THUMB ARTHROSCOPY Left 2006   ? replacement of ligament   . TONSILLECTOMY    . TOTAL ABDOMINAL HYSTERECTOMY     PARTIAL    ROS- all systems are reviewed and negatives except as per HPI above  Current Outpatient Medications  Medication Sig Dispense Refill  . ALPRAZolam (XANAX) 0.25 MG tablet Take 1 tablet (0.25 mg total) by mouth daily as needed for sleep. (Patient taking differently: Take 0.125 mg by mouth at bedtime as needed for sleep. ) 30 tablet 0  . atorvastatin (LIPITOR) 40 MG tablet Take 20  mg by mouth at bedtime.     Marland Kitchen b complex vitamins tablet Take 1 tablet by mouth daily.     . beta carotene w/minerals (OCUVITE) tablet Take 1 tablet by mouth 2 (two) times daily.     . Biotin 5000 MCG TABS Take 5,000 mcg by mouth 2 (two) times daily.     . cholecalciferol (VITAMIN D) 1000 UNITS tablet Take 2,000 Units by mouth 2 (two) times daily.     . Cinnamon 500 MG TABS Take 500 mg by mouth 2 (two) times daily.     . Coenzyme Q10 (COQ-10) 100 MG CAPS Take 100 mg by mouth 2 (two) times daily.     . DiphenhydrAMINE HCl, Sleep, 50 MG CAPS Take 50 mg by  mouth at bedtime.     . folic acid (FOLVITE) 016 MCG tablet Take 800 mcg by mouth daily.     Marland Kitchen levothyroxine (SYNTHROID, LEVOTHROID) 100 MCG tablet Take 100 mcg by mouth daily before breakfast.     . Melatonin 3 MG TABS Take 3 mg by mouth at bedtime.     . nitrofurantoin, macrocrystal-monohydrate, (MACROBID) 100 MG capsule Take 100 mg by mouth at bedtime.     Marland Kitchen OLIVE LEAF EXTRACT PO Take 500 mg by mouth 2 (two) times daily.     . Omega-3 Fatty Acids (FISH OIL PO) Take 1,400 mg by mouth 2 (two) times daily.    Marland Kitchen OVER THE COUNTER MEDICATION Take 500 mg by mouth daily. Tumeric 500mg     . RESTASIS 0.05 % ophthalmic emulsion Place 1 drop into both eyes 2 (two) times daily.    Marland Kitchen warfarin (JANTOVEN) 2.5 MG tablet Take as directed by CVRR (Patient taking differently: 2.5 mg at bedtime. Take as directed by CVRR) 105 tablet 1   No current facility-administered medications for this visit.     Physical Exam: Vitals:   03/22/18 0931  BP: 120/60  Pulse: 65  Weight: 169 lb (76.7 kg)  Height: 5' 4.5" (1.638 m)    GEN- The patient is well appearing, alert and oriented x 3 today.   Head- normocephalic, atraumatic Eyes-  Sclera clear, conjunctiva pink Ears- hearing intact Oropharynx- clear Lungs- Clear to ausculation bilaterally, normal work of breathing Heart- Regular rate and rhythm, no murmurs, rubs or gallops, PMI not laterally displaced GI- soft, NT, ND, + BS Extremities- no clubbing, cyanosis, or edema  Wt Readings from Last 3 Encounters:  03/22/18 169 lb (76.7 kg)  01/07/18 175 lb (79.4 kg)  12/24/17 172 lb 6.4 oz (78.2 kg)    EKG tracing ordered today is personally reviewed and shows sinus rhythm 65 bpm, PR   Assessment and Plan:  1. Paroxysmal atrial fibrillation and atrial flutter ILR is reviewed and reveals no arrhythmias She has not had afib documented since recovering from valvular surgery.  She has had atrial flutter documented on at least 2 different occasions prior to her  ILR implant, none since.  She has also previously worn an event monitor during which she had symptoms but no arrhythmia detected. I have reassured her today   chads2vasc score is 5.  She will likely require long term anticoagulation given prior TIA.  [facility]  Return to see EP PA every 12 months Follow-up with Dr End as scheduled I will see when needed  Thompson Grayer MD, [facility] 03/22/2018 9:59 AM

## 2018-03-22 NOTE — Patient Instructions (Addendum)

## 2018-03-23 DIAGNOSIS — R922 Inconclusive mammogram: Secondary | ICD-10-CM | POA: Diagnosis not present

## 2018-04-14 ENCOUNTER — Ambulatory Visit (INDEPENDENT_AMBULATORY_CARE_PROVIDER_SITE_OTHER): Payer: Medicare Other | Admitting: *Deleted

## 2018-04-14 DIAGNOSIS — R002 Palpitations: Secondary | ICD-10-CM | POA: Diagnosis not present

## 2018-04-16 NOTE — Progress Notes (Signed)
Carelink Summary Report / Loop Recorder 

## 2018-04-20 ENCOUNTER — Ambulatory Visit (INDEPENDENT_AMBULATORY_CARE_PROVIDER_SITE_OTHER): Payer: Medicare Other | Admitting: *Deleted

## 2018-04-20 DIAGNOSIS — Z5181 Encounter for therapeutic drug level monitoring: Secondary | ICD-10-CM

## 2018-04-20 DIAGNOSIS — I4891 Unspecified atrial fibrillation: Secondary | ICD-10-CM | POA: Diagnosis not present

## 2018-04-20 DIAGNOSIS — Z953 Presence of xenogenic heart valve: Secondary | ICD-10-CM | POA: Diagnosis not present

## 2018-04-20 DIAGNOSIS — Q231 Congenital insufficiency of aortic valve: Secondary | ICD-10-CM | POA: Diagnosis not present

## 2018-04-20 DIAGNOSIS — I9789 Other postprocedural complications and disorders of the circulatory system, not elsewhere classified: Secondary | ICD-10-CM

## 2018-04-20 LAB — POCT INR: INR: 2.5 (ref 2.0–3.0)

## 2018-04-20 NOTE — Patient Instructions (Signed)
Description   Continue taking 1 tablet daily. Recheck INR in 6 weeks. Call with any questions. Coumadin Clinic [phone]    

## 2018-04-29 LAB — CUP PACEART REMOTE DEVICE CHECK
Date Time Interrogation Session: 20190721124113
MDC IDC PG IMPLANT DT: 20190516

## 2018-05-04 DIAGNOSIS — N6019 Diffuse cystic mastopathy of unspecified breast: Secondary | ICD-10-CM | POA: Diagnosis not present

## 2018-05-04 DIAGNOSIS — E7849 Other hyperlipidemia: Secondary | ICD-10-CM | POA: Diagnosis not present

## 2018-05-04 DIAGNOSIS — M5416 Radiculopathy, lumbar region: Secondary | ICD-10-CM | POA: Diagnosis not present

## 2018-05-04 DIAGNOSIS — E559 Vitamin D deficiency, unspecified: Secondary | ICD-10-CM | POA: Diagnosis not present

## 2018-05-04 DIAGNOSIS — I779 Disorder of arteries and arterioles, unspecified: Secondary | ICD-10-CM | POA: Diagnosis not present

## 2018-05-04 DIAGNOSIS — E038 Other specified hypothyroidism: Secondary | ICD-10-CM | POA: Diagnosis not present

## 2018-05-04 DIAGNOSIS — I480 Paroxysmal atrial fibrillation: Secondary | ICD-10-CM | POA: Diagnosis not present

## 2018-05-04 DIAGNOSIS — Z23 Encounter for immunization: Secondary | ICD-10-CM | POA: Diagnosis not present

## 2018-05-04 DIAGNOSIS — K222 Esophageal obstruction: Secondary | ICD-10-CM | POA: Diagnosis not present

## 2018-05-04 DIAGNOSIS — Z952 Presence of prosthetic heart valve: Secondary | ICD-10-CM | POA: Diagnosis not present

## 2018-05-04 DIAGNOSIS — I2510 Atherosclerotic heart disease of native coronary artery without angina pectoris: Secondary | ICD-10-CM | POA: Diagnosis not present

## 2018-05-04 DIAGNOSIS — Z6827 Body mass index (BMI) 27.0-27.9, adult: Secondary | ICD-10-CM | POA: Diagnosis not present

## 2018-05-18 LAB — CUP PACEART REMOTE DEVICE CHECK
Implantable Pulse Generator Implant Date: 20190516
MDC IDC SESS DTM: 20190823123844

## 2018-05-19 ENCOUNTER — Ambulatory Visit (INDEPENDENT_AMBULATORY_CARE_PROVIDER_SITE_OTHER): Payer: Medicare Other | Admitting: *Deleted

## 2018-05-19 DIAGNOSIS — I480 Paroxysmal atrial fibrillation: Secondary | ICD-10-CM | POA: Diagnosis not present

## 2018-05-20 NOTE — Progress Notes (Signed)
Carelink Summary Report / Loop Recorder 

## 2018-05-24 LAB — CUP PACEART REMOTE DEVICE CHECK
Date Time Interrogation Session: 20190925141043
MDC IDC PG IMPLANT DT: 20190516

## 2018-06-01 ENCOUNTER — Ambulatory Visit (INDEPENDENT_AMBULATORY_CARE_PROVIDER_SITE_OTHER): Payer: Medicare Other | Admitting: *Deleted

## 2018-06-01 DIAGNOSIS — I9789 Other postprocedural complications and disorders of the circulatory system, not elsewhere classified: Secondary | ICD-10-CM

## 2018-06-01 DIAGNOSIS — Q231 Congenital insufficiency of aortic valve: Secondary | ICD-10-CM | POA: Diagnosis not present

## 2018-06-01 DIAGNOSIS — I4891 Unspecified atrial fibrillation: Secondary | ICD-10-CM | POA: Diagnosis not present

## 2018-06-01 DIAGNOSIS — Z953 Presence of xenogenic heart valve: Secondary | ICD-10-CM | POA: Diagnosis not present

## 2018-06-01 DIAGNOSIS — Q2381 Bicuspid aortic valve: Secondary | ICD-10-CM

## 2018-06-01 DIAGNOSIS — Z5181 Encounter for therapeutic drug level monitoring: Secondary | ICD-10-CM

## 2018-06-01 LAB — POCT INR: INR: 2.3 (ref 2.0–3.0)

## 2018-06-01 NOTE — Patient Instructions (Signed)
Description   Continue taking 1 tablet daily. Recheck INR in 6 weeks. Call with any questions. Coumadin Clinic [phone]    

## 2018-06-16 ENCOUNTER — Other Ambulatory Visit: Payer: Self-pay | Admitting: Internal Medicine

## 2018-06-16 NOTE — Telephone Encounter (Signed)
Please review for refill. Thanks!  

## 2018-06-17 ENCOUNTER — Telehealth: Payer: Self-pay | Admitting: Internal Medicine

## 2018-06-17 NOTE — Telephone Encounter (Signed)
[facility] called wanting to know if it's ok to switch warfarin (JANTOVEN) 2.5 MG tablet from name brand to generic.

## 2018-06-18 NOTE — Telephone Encounter (Signed)
Received a voicemail from the pt regarding that [facility] wants to switch from [place] brand to Warfarin brand; pt's rx was sent in on 06/16/18 per refill in the system. Pt states she will start the next week. Therefore, pt will need INR checked within weeks since brand is changing and it can alter INR.  Pt will come in to have INR checked in 2 wks and 5 days after she starts warfarin brand. Also, spoke with Pharmacist at [facility] and she is aware that it is ok to fill rx that was sent on 06/16/18 and that we have noted the change. She was grateful for the call and will have rx ready for pt.

## 2018-06-21 ENCOUNTER — Ambulatory Visit (INDEPENDENT_AMBULATORY_CARE_PROVIDER_SITE_OTHER): Payer: Medicare Other | Admitting: *Deleted

## 2018-06-21 DIAGNOSIS — I480 Paroxysmal atrial fibrillation: Secondary | ICD-10-CM | POA: Diagnosis not present

## 2018-06-22 NOTE — Progress Notes (Signed)
Carelink Summary Report / Loop Recorder 

## 2018-07-13 ENCOUNTER — Ambulatory Visit (INDEPENDENT_AMBULATORY_CARE_PROVIDER_SITE_OTHER): Payer: Medicare Other | Admitting: Pharmacist

## 2018-07-13 DIAGNOSIS — Q231 Congenital insufficiency of aortic valve: Secondary | ICD-10-CM | POA: Diagnosis not present

## 2018-07-13 DIAGNOSIS — I4891 Unspecified atrial fibrillation: Secondary | ICD-10-CM | POA: Diagnosis not present

## 2018-07-13 DIAGNOSIS — I9789 Other postprocedural complications and disorders of the circulatory system, not elsewhere classified: Secondary | ICD-10-CM | POA: Diagnosis not present

## 2018-07-13 DIAGNOSIS — Z953 Presence of xenogenic heart valve: Secondary | ICD-10-CM

## 2018-07-13 DIAGNOSIS — Z5181 Encounter for therapeutic drug level monitoring: Secondary | ICD-10-CM | POA: Diagnosis not present

## 2018-07-13 LAB — POCT INR: INR: 2.2 (ref 2.0–3.0)

## 2018-07-13 NOTE — Patient Instructions (Signed)
Description   Continue taking 1 tablet daily. Recheck INR in 6 weeks. Call with any questions. Coumadin Clinic [phone]

## 2018-07-14 LAB — CUP PACEART REMOTE DEVICE CHECK
Date Time Interrogation Session: 20191028141002
MDC IDC PG IMPLANT DT: 20190516

## 2018-07-26 ENCOUNTER — Ambulatory Visit (INDEPENDENT_AMBULATORY_CARE_PROVIDER_SITE_OTHER): Payer: Medicare Other

## 2018-07-26 DIAGNOSIS — I480 Paroxysmal atrial fibrillation: Secondary | ICD-10-CM | POA: Diagnosis not present

## 2018-07-26 NOTE — Progress Notes (Signed)
Carelink Summary Report / Loop Recorder 

## 2018-08-23 LAB — CUP PACEART REMOTE DEVICE CHECK
Date Time Interrogation Session: 20191130144047
MDC IDC PG IMPLANT DT: 20190516

## 2018-08-24 ENCOUNTER — Ambulatory Visit (INDEPENDENT_AMBULATORY_CARE_PROVIDER_SITE_OTHER): Payer: Medicare Other | Admitting: Pharmacist

## 2018-08-24 DIAGNOSIS — I9789 Other postprocedural complications and disorders of the circulatory system, not elsewhere classified: Secondary | ICD-10-CM

## 2018-08-24 DIAGNOSIS — Z953 Presence of xenogenic heart valve: Secondary | ICD-10-CM

## 2018-08-24 DIAGNOSIS — I4891 Unspecified atrial fibrillation: Secondary | ICD-10-CM

## 2018-08-24 DIAGNOSIS — Z5181 Encounter for therapeutic drug level monitoring: Secondary | ICD-10-CM

## 2018-08-24 DIAGNOSIS — Q231 Congenital insufficiency of aortic valve: Secondary | ICD-10-CM

## 2018-08-24 LAB — POCT INR: INR: 2.3 (ref 2.0–3.0)

## 2018-08-24 NOTE — Patient Instructions (Signed)
Description   Continue taking 1 tablet daily. Recheck INR in 6 weeks. Call with any questions. Coumadin Clinic [phone]

## 2018-08-26 ENCOUNTER — Ambulatory Visit (INDEPENDENT_AMBULATORY_CARE_PROVIDER_SITE_OTHER): Payer: Medicare Other

## 2018-08-26 DIAGNOSIS — I480 Paroxysmal atrial fibrillation: Secondary | ICD-10-CM | POA: Diagnosis not present

## 2018-08-26 LAB — CUP PACEART REMOTE DEVICE CHECK
Implantable Pulse Generator Implant Date: 20190516
MDC IDC SESS DTM: 20200102154048

## 2018-08-27 NOTE — Progress Notes (Signed)
Carelink Summary Report / Loop Recorder 

## 2018-09-23 DIAGNOSIS — Z803 Family history of malignant neoplasm of breast: Secondary | ICD-10-CM | POA: Diagnosis not present

## 2018-09-23 DIAGNOSIS — Z1231 Encounter for screening mammogram for malignant neoplasm of breast: Secondary | ICD-10-CM | POA: Diagnosis not present

## 2018-09-28 ENCOUNTER — Ambulatory Visit (INDEPENDENT_AMBULATORY_CARE_PROVIDER_SITE_OTHER): Payer: Medicare Other

## 2018-09-28 DIAGNOSIS — I480 Paroxysmal atrial fibrillation: Secondary | ICD-10-CM | POA: Diagnosis not present

## 2018-09-28 DIAGNOSIS — R002 Palpitations: Secondary | ICD-10-CM

## 2018-09-30 LAB — CUP PACEART REMOTE DEVICE CHECK
Implantable Pulse Generator Implant Date: 20190516
MDC IDC SESS DTM: 20200204163911

## 2018-10-05 ENCOUNTER — Ambulatory Visit (INDEPENDENT_AMBULATORY_CARE_PROVIDER_SITE_OTHER): Payer: Medicare Other

## 2018-10-05 DIAGNOSIS — Q231 Congenital insufficiency of aortic valve: Secondary | ICD-10-CM | POA: Diagnosis not present

## 2018-10-05 DIAGNOSIS — Z5181 Encounter for therapeutic drug level monitoring: Secondary | ICD-10-CM

## 2018-10-05 DIAGNOSIS — I9789 Other postprocedural complications and disorders of the circulatory system, not elsewhere classified: Secondary | ICD-10-CM

## 2018-10-05 DIAGNOSIS — Z953 Presence of xenogenic heart valve: Secondary | ICD-10-CM

## 2018-10-05 DIAGNOSIS — I4891 Unspecified atrial fibrillation: Secondary | ICD-10-CM

## 2018-10-05 LAB — POCT INR: INR: 1.9 — AB (ref 2.0–3.0)

## 2018-10-05 MED ORDER — WARFARIN SODIUM 2.5 MG PO TABS: ORAL_TABLET | ORAL | 0 refills | Status: DC

## 2018-10-05 NOTE — Patient Instructions (Signed)
Please take extra 1/2 tablet tonight, then continue taking 1 tablet daily. Recheck INR in 6 weeks. Call with any questions. Coumadin Clinic [phone]

## 2018-10-07 NOTE — Progress Notes (Signed)
Carelink Summary Report / Loop Recorder 

## 2018-10-29 DIAGNOSIS — R82998 Other abnormal findings in urine: Secondary | ICD-10-CM | POA: Diagnosis not present

## 2018-10-29 DIAGNOSIS — E7849 Other hyperlipidemia: Secondary | ICD-10-CM | POA: Diagnosis not present

## 2018-10-29 DIAGNOSIS — E038 Other specified hypothyroidism: Secondary | ICD-10-CM | POA: Diagnosis not present

## 2018-10-29 DIAGNOSIS — E559 Vitamin D deficiency, unspecified: Secondary | ICD-10-CM | POA: Diagnosis not present

## 2018-11-01 ENCOUNTER — Ambulatory Visit (INDEPENDENT_AMBULATORY_CARE_PROVIDER_SITE_OTHER): Payer: Medicare Other | Admitting: *Deleted

## 2018-11-01 DIAGNOSIS — I480 Paroxysmal atrial fibrillation: Secondary | ICD-10-CM | POA: Diagnosis not present

## 2018-11-02 LAB — CUP PACEART REMOTE DEVICE CHECK
Date Time Interrogation Session: 20200308163843
MDC IDC PG IMPLANT DT: 20190516

## 2018-11-05 ENCOUNTER — Encounter: Payer: Self-pay | Admitting: Cardiology

## 2018-11-05 DIAGNOSIS — Z0000 Encounter for general adult medical examination without abnormal findings: Secondary | ICD-10-CM | POA: Diagnosis not present

## 2018-11-05 DIAGNOSIS — Z23 Encounter for immunization: Secondary | ICD-10-CM | POA: Diagnosis not present

## 2018-11-05 DIAGNOSIS — I2510 Atherosclerotic heart disease of native coronary artery without angina pectoris: Secondary | ICD-10-CM | POA: Diagnosis not present

## 2018-11-05 DIAGNOSIS — E7849 Other hyperlipidemia: Secondary | ICD-10-CM | POA: Diagnosis not present

## 2018-11-05 DIAGNOSIS — Z952 Presence of prosthetic heart valve: Secondary | ICD-10-CM | POA: Diagnosis not present

## 2018-11-05 DIAGNOSIS — E038 Other specified hypothyroidism: Secondary | ICD-10-CM | POA: Diagnosis not present

## 2018-11-05 DIAGNOSIS — I480 Paroxysmal atrial fibrillation: Secondary | ICD-10-CM | POA: Diagnosis not present

## 2018-11-05 DIAGNOSIS — M5416 Radiculopathy, lumbar region: Secondary | ICD-10-CM | POA: Diagnosis not present

## 2018-11-05 DIAGNOSIS — N6019 Diffuse cystic mastopathy of unspecified breast: Secondary | ICD-10-CM | POA: Diagnosis not present

## 2018-11-05 DIAGNOSIS — E559 Vitamin D deficiency, unspecified: Secondary | ICD-10-CM | POA: Diagnosis not present

## 2018-11-05 DIAGNOSIS — I779 Disorder of arteries and arterioles, unspecified: Secondary | ICD-10-CM | POA: Diagnosis not present

## 2018-11-05 DIAGNOSIS — Z1389 Encounter for screening for other disorder: Secondary | ICD-10-CM | POA: Diagnosis not present

## 2018-11-09 NOTE — Progress Notes (Signed)
Carelink Summary Report / Loop Recorder 

## 2018-11-16 ENCOUNTER — Other Ambulatory Visit: Payer: Self-pay

## 2018-11-16 ENCOUNTER — Ambulatory Visit (INDEPENDENT_AMBULATORY_CARE_PROVIDER_SITE_OTHER): Payer: Medicare Other | Admitting: Pharmacist

## 2018-11-16 ENCOUNTER — Telehealth: Payer: Self-pay

## 2018-11-16 ENCOUNTER — Ambulatory Visit: Payer: Medicare Other | Admitting: Cardiology

## 2018-11-16 DIAGNOSIS — Z5181 Encounter for therapeutic drug level monitoring: Secondary | ICD-10-CM | POA: Diagnosis not present

## 2018-11-16 DIAGNOSIS — I4891 Unspecified atrial fibrillation: Secondary | ICD-10-CM | POA: Diagnosis not present

## 2018-11-16 DIAGNOSIS — I9789 Other postprocedural complications and disorders of the circulatory system, not elsewhere classified: Secondary | ICD-10-CM | POA: Diagnosis not present

## 2018-11-16 DIAGNOSIS — Q231 Congenital insufficiency of aortic valve: Secondary | ICD-10-CM

## 2018-11-16 DIAGNOSIS — Z953 Presence of xenogenic heart valve: Secondary | ICD-10-CM | POA: Diagnosis not present

## 2018-11-16 LAB — POCT INR: INR: 1.9 — AB (ref 2.0–3.0)

## 2018-11-16 NOTE — Telephone Encounter (Signed)

## 2018-12-03 ENCOUNTER — Ambulatory Visit (INDEPENDENT_AMBULATORY_CARE_PROVIDER_SITE_OTHER): Payer: Medicare Other | Admitting: *Deleted

## 2018-12-03 ENCOUNTER — Other Ambulatory Visit: Payer: Self-pay

## 2018-12-03 DIAGNOSIS — R002 Palpitations: Secondary | ICD-10-CM | POA: Diagnosis not present

## 2018-12-03 DIAGNOSIS — I480 Paroxysmal atrial fibrillation: Secondary | ICD-10-CM

## 2018-12-04 LAB — CUP PACEART REMOTE DEVICE CHECK
Date Time Interrogation Session: 20200410173932
Implantable Pulse Generator Implant Date: 20190516

## 2018-12-13 ENCOUNTER — Telehealth: Payer: Self-pay

## 2018-12-13 NOTE — Progress Notes (Signed)
Carelink Summary Report / Loop Recorder 

## 2018-12-13 NOTE — Telephone Encounter (Signed)

## 2018-12-14 ENCOUNTER — Other Ambulatory Visit: Payer: Self-pay

## 2018-12-14 ENCOUNTER — Ambulatory Visit (INDEPENDENT_AMBULATORY_CARE_PROVIDER_SITE_OTHER): Payer: Medicare Other | Admitting: *Deleted

## 2018-12-14 DIAGNOSIS — Z5181 Encounter for therapeutic drug level monitoring: Secondary | ICD-10-CM | POA: Diagnosis not present

## 2018-12-14 DIAGNOSIS — I9789 Other postprocedural complications and disorders of the circulatory system, not elsewhere classified: Secondary | ICD-10-CM | POA: Diagnosis not present

## 2018-12-14 DIAGNOSIS — I4891 Unspecified atrial fibrillation: Secondary | ICD-10-CM | POA: Diagnosis not present

## 2018-12-14 DIAGNOSIS — Q231 Congenital insufficiency of aortic valve: Secondary | ICD-10-CM

## 2018-12-14 DIAGNOSIS — Z953 Presence of xenogenic heart valve: Secondary | ICD-10-CM

## 2018-12-14 LAB — POCT INR: INR: 2.1 (ref 2.0–3.0)

## 2018-12-24 ENCOUNTER — Telehealth: Payer: Self-pay

## 2018-12-24 NOTE — Telephone Encounter (Signed)

## 2018-12-29 ENCOUNTER — Telehealth: Payer: Medicare Other | Admitting: Cardiology

## 2018-12-30 ENCOUNTER — Other Ambulatory Visit: Payer: Self-pay

## 2018-12-30 ENCOUNTER — Encounter: Payer: Self-pay | Admitting: Cardiology

## 2018-12-30 ENCOUNTER — Telehealth (INDEPENDENT_AMBULATORY_CARE_PROVIDER_SITE_OTHER): Payer: Medicare Other | Admitting: Cardiology

## 2018-12-30 VITALS — BP 114/60 | HR 56 | Ht 64.5 in | Wt 153.0 lb

## 2018-12-30 DIAGNOSIS — Z953 Presence of xenogenic heart valve: Secondary | ICD-10-CM

## 2018-12-30 DIAGNOSIS — I480 Paroxysmal atrial fibrillation: Secondary | ICD-10-CM

## 2018-12-30 DIAGNOSIS — G459 Transient cerebral ischemic attack, unspecified: Secondary | ICD-10-CM

## 2018-12-30 DIAGNOSIS — I2510 Atherosclerotic heart disease of native coronary artery without angina pectoris: Secondary | ICD-10-CM

## 2018-12-30 DIAGNOSIS — I5032 Chronic diastolic (congestive) heart failure: Secondary | ICD-10-CM

## 2018-12-30 DIAGNOSIS — R0609 Other forms of dyspnea: Secondary | ICD-10-CM

## 2018-12-30 MED ORDER — AZITHROMYCIN 250 MG PO TABS: ORAL_TABLET | ORAL | 1 refills | Status: DC

## 2018-12-30 NOTE — Patient Instructions (Signed)
Medication Instructions:  The current medical regimen is effective;  continue present plan and medications. A prescription for Azithromycin 250 mg -take 2 tablets 30 minutes for dental procedure has been sent into your pharmacy.  If you need a refill on your cardiac medications before your next appointment, please call your pharmacy.   Follow-Up: Follow up in 1 year with Dr. Marlou Porch.  You will receive a letter in the mail 2 months before you are due.  Please call us when you receive this letter to schedule your follow up appointment.  Thank you for choosing [facility]!!

## 2018-12-30 NOTE — Progress Notes (Signed)
Virtual Visit via Telephone Note   This visit type was conducted due to national recommendations for restrictions regarding the COVID-19 Pandemic (e.g. social distancing) in an effort to limit this patient's exposure and mitigate transmission in our community.  Due to her co-morbid illnesses, this patient is at least at moderate risk for complications without adequate follow up.  This format is felt to be most appropriate for this patient at this time.  The patient did not have access to video technology/had technical difficulties with video requiring transitioning to audio format only (telephone).  All issues noted in this document were discussed and addressed.  No physical exam could be performed with this format.  Please refer to the patient's chart for her  consent to telehealth for [facility].   Date:  12/30/2018   ID:  Becky Winters, DOB 1941-03-21, MRN [id number]  Patient Location: Home Provider Location: Home  PCP:  Reynold Bowen, MD  Cardiologist:  Candee Furbish, MD  Electrophysiologist:  None   Evaluation Performed:  Follow-Up Visit  Chief Complaint: Paroxysmal atrial fibrillation follow-up  History of Present Illness:    KHIANNA Winters is a 78 y.o. female with paroxysmal atrial fibrillation and flutter with implantable loop recorder here for follow-up.  Followed by Dr. Rayann Heman as well and electrophysiology.  At last visit with Dr. Rayann Heman on 03/22/2018 she was having no documented atrial fibrillation since recovering from her valve surgery.  She did however have atrial flutter documented on at least 2 different occasions prior to her implantable loop implant.  None since.  She had previously worn an event monitor had symptoms but no arrhythmia detected.  Always SOB since born.      The patient does not have symptoms concerning for COVID-19 infection (fever, chills, cough, or new shortness of breath).    Past Medical History:  Diagnosis Date  . Anxiety    conditional, taking in preparation for surgery   . 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 Bicuspid aortic valve 05/10/2014  . Chronic diastolic congestive heart failure ([place])   . Collagenous colitis   . Compression, esophagus 2013   stretched in the past   . Encounter for therapeutic drug monitoring 06/15/2014  . Exertional dyspnea 05/10/2014  . Foot swelling    - LEFT-ever since she had an injury as a child   . GERD (gastroesophageal reflux disease)    prevacid on occasion  . H/O hiatal hernia   . HLD (hyperlipidemia)   . Hx of echocardiogram    post AVR >> Echo (12/15):  Mild LVH, EF 55%, AVR ok (Mean gradient (S): 10 mm Hg), mild MR, mild LAE  . Hypothyroidism   . Orthostatic hypotension 05/10/2014  . Postoperative atrial fibrillation ([place]) 06/09/2014  . 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 Severe aortic stenosis 05/21/2014   s/p AVR 05/2014  . TIA (transient ischemic attack)   . UTI (urinary tract infection)    frequent UTI-----pt. reports that she takes Cipro if needed, last UTI- July 2015   Past Surgical History:  Procedure Laterality Date  . AORTIC VALVE REPLACEMENT N/A 06/07/2014   Procedure: AORTIC VALVE REPLACEMENT  (AVR) with 72mm Aortic Perimount Magna Ease;  Surgeon: Rexene Alberts, MD;  Location: [place];  Service: Open Heart Surgery;  Laterality: N/A;  . APPENDECTOMY    .  BLADDER SURGERY     x2 for tacking  . CARDIAC CATHETERIZATION    . cardiolite  2003, 2006  . CHOLECYSTECTOMY    . ESOPHAGEAL DILATION    . INTRAOPERATIVE TRANSESOPHAGEAL ECHOCARDIOGRAM N/A 06/07/2014   Procedure: INTRAOPERATIVE TRANSESOPHAGEAL ECHOCARDIOGRAM;  Surgeon: Rexene Alberts, MD;  Location: [place];  Service: Open Heart Surgery;  Laterality: N/A;  . LEFT AND RIGHT HEART CATHETERIZATION WITH CORONARY ANGIOGRAM N/A 05/25/2014   Procedure: LEFT AND RIGHT  HEART CATHETERIZATION WITH CORONARY ANGIOGRAM;  Surgeon: Larey Dresser, MD;  Location: [facility] CATH LAB;  Service: Cardiovascular;  Laterality: N/A;  . LOOP RECORDER INSERTION N/A 01/07/2018   Procedure: LOOP RECORDER INSERTION;  Surgeon: Thompson Grayer, MD;  Location: [place] CV LAB;  Service: Cardiovascular;  Laterality: N/A;  . NASAL SEPTUM SURGERY  1995  . THUMB ARTHROSCOPY Left 2006   ? replacement of ligament   . TONSILLECTOMY    . TOTAL ABDOMINAL HYSTERECTOMY     PARTIAL     Current Meds  Medication Sig  . ALPRAZolam (XANAX) 0.25 MG tablet Take 1 tablet (0.25 mg total) by mouth daily as needed for sleep.  Marland Kitchen atorvastatin (LIPITOR) 40 MG tablet Take 20 mg by mouth at bedtime.   Marland Kitchen b complex vitamins tablet Take 1 tablet by mouth daily.   . beta carotene w/minerals (OCUVITE) tablet Take 1 tablet by mouth 2 (two) times daily.   . Biotin 5000 MCG TABS Take 5,000 mcg by mouth 2 (two) times daily.   . cholecalciferol (VITAMIN D) 1000 UNITS tablet Take 2,000 Units by mouth 2 (two) times daily.   . Cinnamon 500 MG TABS Take 500 mg by mouth 2 (two) times daily.   . Coenzyme Q10 (COQ-10) 100 MG CAPS Take 100 mg by mouth 2 (two) times daily.   . DiphenhydrAMINE HCl, Sleep, 50 MG CAPS Take 50 mg by mouth at bedtime.   . folic acid (FOLVITE) 308 MCG tablet Take 800 mcg by mouth daily.   Marland Kitchen levothyroxine (SYNTHROID, LEVOTHROID) 100 MCG tablet Take 100 mcg by mouth daily before breakfast.   . Melatonin 3 MG TABS Take 3 mg by mouth at bedtime.   . nitrofurantoin, macrocrystal-monohydrate, (MACROBID) 100 MG capsule Take 100 mg by mouth at bedtime.   Marland Kitchen OLIVE LEAF EXTRACT PO Take 500 mg by mouth 2 (two) times daily.   . Omega-3 Fatty Acids (FISH OIL PO) Take 1,400 mg by mouth 2 (two) times daily.  Marland Kitchen OVER THE COUNTER MEDICATION Take 500 mg by mouth daily. Tumeric 500mg   . RESTASIS 0.05 % ophthalmic emulsion Place 1 drop into both eyes 2 (two) times daily.  Marland Kitchen warfarin (JANTOVEN) 2.5 MG tablet TAKE AS  DIRECTED BY CVRR     Allergies:   Amiodarone; Clindamycin/lincomycin; Asacol [mesalamine]; Codeine; Pentasa [mesalamine er]; Sulfa antibiotics; and Amoxicillin   Social History   Tobacco Use  . Smoking status: Never Smoker  . Smokeless tobacco: Never Used  Substance Use Topics  . Alcohol use: No  . Drug use: No     Family Hx: The patient's family history includes CAD (age of onset: 79) in her father; CVA in an other family member; Heart attack in her father and paternal grandfather; Stroke in her paternal grandmother. There is no history of Hypertension.  ROS:   Please see the history of present illness.    Denies any fevers chills nausea vomiting syncope bleeding All other systems reviewed and are negative.   Prior CV studies:   The  following studies were reviewed today:  Cardiovascular Problems:  Bicuspid aortic valve status post bioprosthetic AVR  Paroxysmal atrial fibrillation and flutter (intolerant of amiodarone)  TIA  Nonobstructive coronary artery disease  Risk Factors:  Nonobstructive CAD,TIA, hyperlipidemia, and age greater than 4  Cath/PCI:  LHC/RHC (05/25/14): LMCA normal. LAD normal. Relatively small LCx with 40% proximal stenosis. Dominant RCA without disease. RA 4, RV 26/4, PA 23/80 (14), PCWP 7. AO sat 97%, PA sat 81%. Fick CO 8.0, Fick CI 4.4.  CV Surgery:  Aortic valve replacement (06/07/14, Dr. Roxy Manns): [facility] Ease Pericardial Tissue Valve (size 23 mm, model # 3300TFX, serial # [id number])  EP Procedures and Devices:  30-day event monitor (08/02/15): Sinus rhythm without significant arrhythmias.  Non-Invasive Evaluation(s):  TTE (03/19/17):Normal LV size. LVEF 55-60% with normal wall motion and diastolic function. Bioprosthetic aortic valve present with mean gradient of 11 mmHg. Trivial MR. Mild TR and PR. Mildly dilated RV with normal contraction.  TTE (08/15/15): Normal LV size and wall thickness with LVEF of 55-60%. Normal wall  motion. Grade 2 diastolic dysfunction. Bioprosthetic aortic valve present with mean gradient of 15 mmHg. Trace MR and TR. Normal RV size and function.   Labs/Other Tests and Data Reviewed:    EKG:  An ECG dated 03/22/2018 was personally reviewed today and demonstrated:  Sinus rhythm 65 with nonspecific ST-T wave changes  Recent Labs: No results found for requested labs within last 8760 hours.   Recent Lipid Panel Lab Results  Component Value Date/Time   CHOL 117 06/15/2017 08:45 AM   TRIG 112 06/15/2017 08:45 AM   HDL 41 06/15/2017 08:45 AM   CHOLHDL 2.9 06/15/2017 08:45 AM   CHOLHDL 3.1 03/12/2016 02:47 PM   LDLCALC 54 06/15/2017 08:45 AM    Wt Readings from Last 3 Encounters:  12/30/18 153 lb (69.4 kg)  03/22/18 169 lb (76.7 kg)  01/07/18 175 lb (79.4 kg)     Objective:    Vital Signs:  BP 114/60   Pulse (!) 56   Ht 5' 4.5" (1.638 m)   Wt 153 lb (69.4 kg)   BMI 25.86 kg/m    VITAL SIGNS:  reviewed great job with weight loss.  No respiratory distress noted on phone.  Alert.  Pleasant.  ASSESSMENT & PLAN:    Paroxysmal atrial fibrillation flutter - No incidences since implantable loop recorder - Prior symptoms however event monitor showed no arrhythmias.  Chronic anticoagulation - CHADSVASc is 5.  Prior TIA.  We would like to continue long-term anticoagulation. Coumadin. [place] lab.   Aortic valve replacement bioprosthetic for bicuspid aortic valve - Overall doing well.  Echo reviewed.  Dental prophylaxis-needs refill. No clinda, no amox. Aleergies. 6 pack AZITHROMYIN 250mg  tabs. Take 2 tabs 55min before dental. [facility].   Chronic diastolic heart failure - Has had shortness of breath, NYHA class III symptoms. Been present since birth she states.  She is always short of breath she states.  This is stable however. -Excellent job with 20 pound weight loss.    COVID-19 Education: The signs and symptoms of COVID-19 were discussed with the patient and how to  seek care for testing (follow up with PCP or arrange E-visit).  The importance of social distancing was discussed today.  Time:   Today, I have spent 15 minutes with the patient with telehealth technology discussing the above problems.     Medication Adjustments/Labs and Tests Ordered: Current medicines are reviewed at length with the patient today.  Concerns regarding  medicines are outlined above.   Tests Ordered: No orders of the defined types were placed in this encounter.   Medication Changes: Meds ordered this encounter  Medications  . azithromycin (ZITHROMAX) 250 MG tablet    Sig: Take (2) tablets by mouth 30 minutes before dental procedure    Dispense:  6 each    Refill:  1    Disposition:  Follow up in 1 year(s)  Signed, Candee Furbish, MD  12/30/2018 8:57 AM    [facility]

## 2019-01-05 ENCOUNTER — Other Ambulatory Visit: Payer: Self-pay

## 2019-01-05 ENCOUNTER — Ambulatory Visit (INDEPENDENT_AMBULATORY_CARE_PROVIDER_SITE_OTHER): Payer: Medicare Other | Admitting: *Deleted

## 2019-01-05 DIAGNOSIS — I5032 Chronic diastolic (congestive) heart failure: Secondary | ICD-10-CM

## 2019-01-05 DIAGNOSIS — I480 Paroxysmal atrial fibrillation: Secondary | ICD-10-CM

## 2019-01-06 LAB — CUP PACEART REMOTE DEVICE CHECK
Date Time Interrogation Session: 20200513181227
Implantable Pulse Generator Implant Date: 20190516

## 2019-01-07 ENCOUNTER — Other Ambulatory Visit: Payer: Self-pay | Admitting: Cardiology

## 2019-01-10 ENCOUNTER — Telehealth: Payer: Self-pay

## 2019-01-10 NOTE — Telephone Encounter (Signed)

## 2019-01-11 ENCOUNTER — Other Ambulatory Visit: Payer: Self-pay

## 2019-01-11 ENCOUNTER — Ambulatory Visit (INDEPENDENT_AMBULATORY_CARE_PROVIDER_SITE_OTHER): Payer: Medicare Other | Admitting: *Deleted

## 2019-01-11 ENCOUNTER — Ambulatory Visit: Payer: Medicare Other | Admitting: Cardiology

## 2019-01-11 DIAGNOSIS — Z953 Presence of xenogenic heart valve: Secondary | ICD-10-CM | POA: Diagnosis not present

## 2019-01-11 DIAGNOSIS — I4891 Unspecified atrial fibrillation: Secondary | ICD-10-CM | POA: Diagnosis not present

## 2019-01-11 DIAGNOSIS — I9789 Other postprocedural complications and disorders of the circulatory system, not elsewhere classified: Secondary | ICD-10-CM

## 2019-01-11 DIAGNOSIS — Z5181 Encounter for therapeutic drug level monitoring: Secondary | ICD-10-CM | POA: Diagnosis not present

## 2019-01-11 DIAGNOSIS — Q231 Congenital insufficiency of aortic valve: Secondary | ICD-10-CM | POA: Diagnosis not present

## 2019-01-11 LAB — POCT INR: INR: 2.2 (ref 2.0–3.0)

## 2019-01-24 NOTE — Progress Notes (Signed)
Carelink Summary Report / Loop Recorder 

## 2019-02-02 ENCOUNTER — Telehealth: Payer: Self-pay

## 2019-02-02 NOTE — Telephone Encounter (Signed)
Unable to lmom

## 2019-02-07 ENCOUNTER — Ambulatory Visit (INDEPENDENT_AMBULATORY_CARE_PROVIDER_SITE_OTHER): Payer: Medicare Other | Admitting: *Deleted

## 2019-02-07 DIAGNOSIS — I480 Paroxysmal atrial fibrillation: Secondary | ICD-10-CM | POA: Diagnosis not present

## 2019-02-07 LAB — CUP PACEART REMOTE DEVICE CHECK
Date Time Interrogation Session: 20200615183532
Implantable Pulse Generator Implant Date: 20190516

## 2019-02-08 ENCOUNTER — Ambulatory Visit (INDEPENDENT_AMBULATORY_CARE_PROVIDER_SITE_OTHER): Payer: Medicare Other | Admitting: Pharmacist

## 2019-02-08 ENCOUNTER — Other Ambulatory Visit: Payer: Self-pay

## 2019-02-08 DIAGNOSIS — I4891 Unspecified atrial fibrillation: Secondary | ICD-10-CM | POA: Diagnosis not present

## 2019-02-08 DIAGNOSIS — Z953 Presence of xenogenic heart valve: Secondary | ICD-10-CM

## 2019-02-08 DIAGNOSIS — I9789 Other postprocedural complications and disorders of the circulatory system, not elsewhere classified: Secondary | ICD-10-CM | POA: Diagnosis not present

## 2019-02-08 DIAGNOSIS — Z5181 Encounter for therapeutic drug level monitoring: Secondary | ICD-10-CM | POA: Diagnosis not present

## 2019-02-08 DIAGNOSIS — Q231 Congenital insufficiency of aortic valve: Secondary | ICD-10-CM | POA: Diagnosis not present

## 2019-02-08 LAB — POCT INR: INR: 2.4 (ref 2.0–3.0)

## 2019-02-08 NOTE — Patient Instructions (Signed)
Description   Continue taking 1 tablet daily except 1.5 tablets on Tuesdays. Recheck INR in 6 weeks. Call with any questions. Coumadin Clinic [phone]

## 2019-02-15 NOTE — Progress Notes (Signed)
Carelink Summary Report / Loop Recorder 

## 2019-03-13 LAB — CUP PACEART REMOTE DEVICE CHECK
Date Time Interrogation Session: 20200718193755
Implantable Pulse Generator Implant Date: 20190516

## 2019-03-14 ENCOUNTER — Ambulatory Visit (INDEPENDENT_AMBULATORY_CARE_PROVIDER_SITE_OTHER): Payer: Medicare Other | Admitting: *Deleted

## 2019-03-14 DIAGNOSIS — I480 Paroxysmal atrial fibrillation: Secondary | ICD-10-CM | POA: Diagnosis not present

## 2019-03-16 DIAGNOSIS — Z961 Presence of intraocular lens: Secondary | ICD-10-CM | POA: Diagnosis not present

## 2019-03-16 DIAGNOSIS — H52203 Unspecified astigmatism, bilateral: Secondary | ICD-10-CM | POA: Diagnosis not present

## 2019-03-16 DIAGNOSIS — H04123 Dry eye syndrome of bilateral lacrimal glands: Secondary | ICD-10-CM | POA: Diagnosis not present

## 2019-03-17 ENCOUNTER — Telehealth: Payer: Self-pay

## 2019-03-17 NOTE — Telephone Encounter (Signed)
Spoke with pt regarding covid-19 screening prior to appt. Pt stated She has not been in contact with anyone who may have covid-19 and has no symptoms.

## 2019-03-22 ENCOUNTER — Other Ambulatory Visit: Payer: Self-pay

## 2019-03-22 ENCOUNTER — Ambulatory Visit (INDEPENDENT_AMBULATORY_CARE_PROVIDER_SITE_OTHER): Payer: Medicare Other | Admitting: *Deleted

## 2019-03-22 DIAGNOSIS — I4891 Unspecified atrial fibrillation: Secondary | ICD-10-CM

## 2019-03-22 DIAGNOSIS — Q231 Congenital insufficiency of aortic valve: Secondary | ICD-10-CM

## 2019-03-22 DIAGNOSIS — Z953 Presence of xenogenic heart valve: Secondary | ICD-10-CM | POA: Diagnosis not present

## 2019-03-22 DIAGNOSIS — Z5181 Encounter for therapeutic drug level monitoring: Secondary | ICD-10-CM

## 2019-03-22 DIAGNOSIS — I9789 Other postprocedural complications and disorders of the circulatory system, not elsewhere classified: Secondary | ICD-10-CM

## 2019-03-22 LAB — POCT INR: INR: 2 (ref 2.0–3.0)

## 2019-03-22 NOTE — Patient Instructions (Signed)
Description   Today take 2 tablets then continue taking 1 tablet daily except 1.5 tablets on Tuesdays. Recheck INR in 8 weeks. Call with any questions. Coumadin Clinic [phone]

## 2019-03-28 ENCOUNTER — Encounter: Payer: Medicare Other | Admitting: Physician Assistant

## 2019-03-29 NOTE — Progress Notes (Signed)
Carelink Summary Report / Loop Recorder 

## 2019-03-31 NOTE — Progress Notes (Signed)
Cardiology Office Note Date:  04/01/2019  Patient ID:  Becky Winters, Becky Winters October 28, 1940, MRN [id number] PCP:  Reynold Bowen, MD  Cardiologist:  Dr. Marlou Porch Electrophysiologist: Dr. Rayann Heman     Chief Complaint: annual visit, increasing AFib  History of Present Illness: Becky Winters is a 78 y.o. female with history of h/o congenital bicuspid AV, severe As > s/p AVR (bioprosthetic) 2015, TIA, hypothyroidism, chronic component of baseline SOB (life long), compressed esophagus with h/o dilation, h/o non-obstructive CAD by cath 2015, and post-op AFib, prior AFlutter, chronic CHF (diastolic).  She comes in today to be seen for Dr. Rayann Heman.  Last seen by him July 2019.  She was doing well, no Afib since recovered from her AVR.  H/o AFlutter previously by EM, though none on her loop.  Also mentioned h/o and event monitor with reported symptoms, no arrhythmias.   More recenyl a telehealth visit with Dr. Marlou Porch in May, chronic stable SOB, overall was doing well, reviewed dental prophylaxis, noted success with her weight loss efforts  Of late via her loop, she has had noted true Afib events and her appointment today is to discuss this.  She feels quite well.  A couple days ago she was standing on a chair to get something and slipped, no trauma but did pull a muscle in her back.  This has bothered her, but otherwise has no complaints. No CP, palpitations.  NO cardiac awareness of any kind.  She denies any SOB, DOE, no symptoms of PND or orthopnea.  She doesn't formally exercise, though cares for her home, house work, carrying [employer] and so on without any exertional intolerances or changes to her capacity.  No near syncope or syncope.  She did admit to rare fleeting dizzy spells that last a second, none in several months. Also infrequently she gets L eye visual changes that has been attributed to occular migraines since the age of 29, these unchanged in frequency or duration.  She is extremely  surprised to hear she is in AFlutter today, feeling "perfect"  COVID education/precautions were discussed with the patient today   Device information MDT ILR, implanted 01/07/18, for AFib surveillance and management    Past Medical History:  Diagnosis Date  . Anxiety    conditional, taking in preparation for surgery   . 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 Bicuspid aortic valve 05/10/2014  . Chronic diastolic congestive heart failure ([place])   . Collagenous colitis   . Compression, esophagus 2013   stretched in the past   . Encounter for therapeutic drug monitoring 06/15/2014  . Exertional dyspnea 05/10/2014  . Foot swelling    - LEFT-ever since she had an injury as a child   . GERD (gastroesophageal reflux disease)    prevacid on occasion  . H/O hiatal hernia   . HLD (hyperlipidemia)   . Hx of echocardiogram    post AVR >> Echo (12/15):  Mild LVH, EF 55%, AVR ok (Mean gradient (S): 10 mm Hg), mild MR, mild LAE  . Hypothyroidism   . Orthostatic hypotension 05/10/2014  . Postoperative atrial fibrillation ([place]) 06/09/2014  . 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/07/2014   23 mm [facility] Ease bovine pericardial tissue valve  . Severe aortic stenosis 05/21/2014   s/p AVR 05/2014  . TIA (transient ischemic attack)   . UTI (urinary tract infection)    frequent UTI-----pt. reports  that she takes Cipro if needed, last UTI- July 2015    Past Surgical History:  Procedure Laterality Date  . AORTIC VALVE REPLACEMENT N/A 06/07/2014   Procedure: AORTIC VALVE REPLACEMENT  (AVR) with 68mm Aortic Perimount Magna Ease;  Surgeon: Rexene Alberts, MD;  Location: [place];  Service: Open Heart Surgery;  Laterality: N/A;  . APPENDECTOMY    . BLADDER SURGERY     x2 for tacking  . CARDIAC CATHETERIZATION    . cardiolite  2003, 2006  . CHOLECYSTECTOMY    . ESOPHAGEAL DILATION    . INTRAOPERATIVE TRANSESOPHAGEAL ECHOCARDIOGRAM  N/A 06/07/2014   Procedure: INTRAOPERATIVE TRANSESOPHAGEAL ECHOCARDIOGRAM;  Surgeon: Rexene Alberts, MD;  Location: [place];  Service: Open Heart Surgery;  Laterality: N/A;  . LEFT AND RIGHT HEART CATHETERIZATION WITH CORONARY ANGIOGRAM N/A 05/25/2014   Procedure: LEFT AND RIGHT HEART CATHETERIZATION WITH CORONARY ANGIOGRAM;  Surgeon: Larey Dresser, MD;  Location: [facility] C[facility] LAB;  Service: Cardiovascular;  Laterality: N/A;  . LOOP RECORDER INSERTION N/A 01/07/2018   Procedure: LOOP RECORDER INSERTION;  Surgeon: Thompson Grayer, MD;  Location: [place] CV LAB;  Service: Cardiovascular;  Laterality: N/A;  . NASAL SEPTUM SURGERY  1995  . THUMB ARTHROSCOPY Left 2006   ? replacement of ligament   . TONSILLECTOMY    . TOTAL ABDOMINAL HYSTERECTOMY     PARTIAL    Current Outpatient Medications  Medication Sig Dispense Refill  . ALPRAZolam (XANAX) 0.25 MG tablet Take 1 tablet (0.25 mg total) by mouth daily as needed for sleep. 30 tablet 0  . atorvastatin (LIPITOR) 40 MG tablet Take 20 mg by mouth at bedtime.     Marland Kitchen azithromycin (ZITHROMAX) 250 MG tablet Take (2) tablets by mouth 30 minutes before dental procedure 6 each 1  . b complex vitamins tablet Take 1 tablet by mouth daily.     . beta carotene w/minerals (OCUVITE) tablet Take 1 tablet by mouth 2 (two) times daily.     . Biotin 5000 MCG TABS Take 5,000 mcg by mouth 2 (two) times daily.     . cholecalciferol (VITAMIN D) 1000 UNITS tablet Take 2,000 Units by mouth 2 (two) times daily.     . Cinnamon 500 MG TABS Take 500 mg by mouth 2 (two) times daily.     . Coenzyme Q10 (COQ-10) 100 MG CAPS Take 100 mg by mouth 2 (two) times daily.     . DiphenhydrAMINE HCl, Sleep, 50 MG CAPS Take 50 mg by mouth at bedtime.     . folic acid (FOLVITE) 683 MCG tablet Take 800 mcg by mouth daily.     Marland Kitchen levothyroxine (SYNTHROID, LEVOTHROID) 100 MCG tablet Take 100 mcg by mouth daily before breakfast.     . Melatonin 3 MG TABS Take 3 mg by mouth at bedtime.     .  nitrofurantoin, macrocrystal-monohydrate, (MACROBID) 100 MG capsule Take 100 mg by mouth at bedtime.     Marland Kitchen OLIVE LEAF EXTRACT PO Take 500 mg by mouth 2 (two) times daily.     . Omega-3 Fatty Acids (FISH OIL PO) Take 1,400 mg by mouth 2 (two) times daily.    Marland Kitchen OVER THE COUNTER MEDICATION Take 500 mg by mouth daily. Tumeric 500mg     . RESTASIS 0.05 % ophthalmic emulsion Place 1 drop into both eyes 2 (two) times daily.    Marland Kitchen warfarin (COUMADIN) 2.5 MG tablet TAKE AS DIRECTED BY CVRR 105 tablet 0   No current facility-administered medications for this  visit.     Allergies:   Amiodarone, Clindamycin/lincomycin, Asacol [mesalamine], Codeine, Pentasa [mesalamine er], Sulfa antibiotics, and Amoxicillin   Social History:  The patient  reports that she has never smoked. She has never used smokeless tobacco. She reports that she does not drink alcohol or use drugs.   Family History:  The patient's family history includes CAD (age of onset: 63) in her father; CVA in an other family member; Heart attack in her father and paternal grandfather; Stroke in her paternal grandmother.  ROS:  Please see the history of present illness.   All other systems are reviewed and otherwise negative.   PHYSICAL EXAM:  VS:  BP 99/67   Pulse 75   Ht 5' 4.5" (1.638 m)   Wt 158 lb 12.8 oz (72 kg)   BMI 26.84 kg/m  BMI: Body mass index is 26.84 kg/m. Well nourished, well developed, in no acute distress  HEENT: normocephalic, atraumatic  Neck: no JVD, carotid bruits or masses Cardiac:  irreg-irreg; 1/6 SM, no rubs, or gallops Lungs:  CTA b/l, no wheezing, rhonchi or rales  Abd: soft, nontender MS: no deformity, age appropriate atrophy Ext:  no edema  Skin: warm and dry, no rash Neuro:  No gross deficits appreciated Psych: euthymic mood, full affect   ILR site is stable, no tethering or discomfort   EKG:  Done today and reviewed by myself shows AFlutter, 75bpm,   Loop interrogation done today and reviewed by  myself:    03/19/17: TTE Study Conclusions - Left ventricle: The cavity size was normal. Systolic function was   normal. The estimated ejection fraction was in the range of 55%   to 60%. Wall motion was normal; there were no regional wall   motion abnormalities. Left ventricular diastolic function   parameters were normal. - Aortic valve: Poorly visualized. A bioprosthesis was present and   functioning normally. There was trivial regurgitation. Mean   gradient (S): 11 mm Hg. Valve area (VTI): 1.2 cm^2. Valve area   (Vmax): 1.13 cm^2. Valve area (Vmean): 1.14 cm^2. - Mitral valve: There was trivial regurgitation. - Right ventricle: The cavity size was mildly dilated. Wall   thickness was normal. - Tricuspid valve: There was mild regurgitation. - Pulmonic valve: There was trivial regurgitation.  Recent Labs: No results found for requested labs within last 8760 hours.  No results found for requested labs within last 8760 hours.   CrCl cannot be calculated (Patient's most recent lab result is older than the maximum 21 days allowed.).   Wt Readings from Last 3 Encounters:  04/01/19 158 lb 12.8 oz (72 kg)  12/30/18 153 lb (69.4 kg)  03/22/18 169 lb (76.7 kg)     Other studies reviewed: Additional studies/records reviewed today include: summarized above  ASSESSMENT AND PLAN:  1. Paroxysmal Afib     CHA2DS2Vasc is 5, on Warfarin, monitored and managed by the coumadin clinic      She is in [place] today, doesn't look quite typical Asymptomatic Started 03/30/2019, ? If 2/2 her back pain Log suggests in/out, though is pretty regular in flutter. Rates fairly well controlled  Discussed with the patient management strategies.  She is very reluctant to consider any kind of intervention feeling very well.   I urged that since her Afib had been stable and think it is very reasonable to consider DCCV at least, though she remains un-inclined. Agreeable to a 6 month follow up only, unless  sooner is needed  We will schedule her  for an echo and she will send a transmission on Monday    2. VHD h/o severe AS     S/p AVR 2015     Echo 2018 with normal prosthetic funtion     C/w Dr. Marlou Porch   3. Chronic CHF (diastolic)     Last echo with normal diastolic function     H/o a component of chronic baseline SOB      No symptoms or exam findings to suggest fluid OL      Disposition: F/u as above  Current medicines are reviewed at length with the patient today.  The patient did not have any concerns regarding medicines.  Venetia Night, PA-C 04/01/2019 10:41 AM     [facility] [place] [phone] (office)  [phone] (fax)

## 2019-04-01 ENCOUNTER — Encounter: Payer: Self-pay | Admitting: Physician Assistant

## 2019-04-01 ENCOUNTER — Other Ambulatory Visit: Payer: Self-pay

## 2019-04-01 ENCOUNTER — Ambulatory Visit (INDEPENDENT_AMBULATORY_CARE_PROVIDER_SITE_OTHER): Payer: Medicare Other | Admitting: Physician Assistant

## 2019-04-01 VITALS — BP 99/67 | HR 75 | Ht 64.5 in | Wt 158.8 lb

## 2019-04-01 DIAGNOSIS — I5032 Chronic diastolic (congestive) heart failure: Secondary | ICD-10-CM | POA: Diagnosis not present

## 2019-04-01 DIAGNOSIS — I2510 Atherosclerotic heart disease of native coronary artery without angina pectoris: Secondary | ICD-10-CM

## 2019-04-01 DIAGNOSIS — Z952 Presence of prosthetic heart valve: Secondary | ICD-10-CM

## 2019-04-01 DIAGNOSIS — I4892 Unspecified atrial flutter: Secondary | ICD-10-CM

## 2019-04-01 DIAGNOSIS — I483 Typical atrial flutter: Secondary | ICD-10-CM

## 2019-04-01 DIAGNOSIS — Z4509 Encounter for adjustment and management of other cardiac device: Secondary | ICD-10-CM | POA: Diagnosis not present

## 2019-04-01 NOTE — Patient Instructions (Signed)
Medication Instructions:  NONE ORDERED  TODAY  If you need a refill on your cardiac medications before your next appointment, please call your pharmacy.   Lab work: NONE ORDERED  TODAY '  If you have labs (blood work) drawn today and your tests are completely normal, you will receive your results only by: Marland Kitchen MyChart Message (if you have MyChart) OR . A paper copy in the mail If you have any lab test that is abnormal or we need to change your treatment, we will call you to review the results.  Testing/Procedures:Your physician has requested that you have an echocardiogram. Echocardiography is a painless test that uses sound waves to create images of your heart. It provides your doctor with information about the size and shape of your heart and how well your heart's chambers and valves are working. This procedure takes approximately one hour. There are no restrictions for this procedure.   Follow-Up: At [facility], you and your health needs are our priority.  As part of our continuing mission to provide you with exceptional heart care, we have created designated Provider Care Teams.  These Care Teams include your primary Cardiologist (physician) and Advanced Practice Providers (APPs -  Physician Assistants and Nurse Practitioners) who all work together to provide you with the care you need, when you need it. You will need a follow up appointment in 6 months.  Please call our office 2 months in advance to schedule this appointment.  You may see  Dr. Rayann Heman  or one of the following Advanced Practice Providers on your designated Care Team:   Chanetta Marshall, NP . Tommye Standard, PA-C  Any Other Special Instructions Will Be Listed Below (If Applicable).

## 2019-04-04 ENCOUNTER — Ambulatory Visit: Payer: Medicare Other | Admitting: *Deleted

## 2019-04-04 DIAGNOSIS — E038 Other specified hypothyroidism: Secondary | ICD-10-CM | POA: Diagnosis not present

## 2019-04-04 DIAGNOSIS — I480 Paroxysmal atrial fibrillation: Secondary | ICD-10-CM

## 2019-04-07 ENCOUNTER — Other Ambulatory Visit: Payer: Self-pay

## 2019-04-07 ENCOUNTER — Ambulatory Visit (HOSPITAL_COMMUNITY): Payer: Medicare Other | Attending: Cardiovascular Disease

## 2019-04-07 DIAGNOSIS — I4892 Unspecified atrial flutter: Secondary | ICD-10-CM | POA: Insufficient documentation

## 2019-04-08 ENCOUNTER — Telehealth: Payer: Self-pay | Admitting: Physician Assistant

## 2019-04-08 NOTE — Telephone Encounter (Signed)
Returned patient call regarding her echo and recommendation to the AFib clinic. She had read her echo report and felt like her heart was "in bad shape", we discussed the findings, explained her heart muscle function was OK as was the funtion of her valve and the echo overall I did not find concerning, in fact was reassuring.  She was under the impression that she was being sent into the hospital, I discussed that the Afib clinic while located at [facility] was not an in-patient or admission in any way.  I explained to her the specialty of the clinic that it is perfect for her to help [place] get her in normal rhythm again, either by way of cardioversion or medicines and they can facilitate this process the best.   And that is an excellent point of contact for her.  REassured her the Dr. Rayann Heman was still her electrophysiologist and was not handing her care over.  Discussed that they wokrk closely with Dr. Rayann Heman, and all of [place] in the group  She inquired about an ablation.  I told her I felt moving to an ablation now may be premature, to 1st try less invasive strategy.  She asked why her pace did not do anything for this.  I again discussed that she does  not have a pacer but a loop monitor only and it (noe a pacer) could intervene on AFib, only keep track of it. She mentioned she sent a transmission that a couple times this week she had not felt well, dizzy a couple times  No fainting.  I told her I would review the transmission and if anything of concern, I would call her back.  AT the end of our conversation, she (and her husband also on the phone) had no further questions, she was agreeable to go to the AFib appointment as planned.  She was very appreciative the call and discussion, said she felt better about things.  I have reviewed her loop transmission, dated today.  On 8.12.2020 there was symptom episode of AF RVR 140bpm, her HR though on average is <100, generally 80's-90's.  No brady or pause  episodes.  Tommye Standard, PA-C

## 2019-04-10 LAB — CUP PACEART REMOTE DEVICE CHECK
Date Time Interrogation Session: 20200810040500
Implantable Pulse Generator Implant Date: 20190516

## 2019-04-11 ENCOUNTER — Encounter (HOSPITAL_COMMUNITY): Payer: Self-pay

## 2019-04-12 ENCOUNTER — Ambulatory Visit (HOSPITAL_COMMUNITY)
Admission: RE | Admit: 2019-04-12 | Discharge: 2019-04-12 | Disposition: A | Discharge status: Home / Self Care | Payer: Medicare Other | Source: Ambulatory Visit | Attending: Nurse Practitioner | Admitting: Nurse Practitioner

## 2019-04-12 ENCOUNTER — Encounter (HOSPITAL_COMMUNITY): Payer: Self-pay | Admitting: Nurse Practitioner

## 2019-04-12 ENCOUNTER — Other Ambulatory Visit: Payer: Self-pay

## 2019-04-12 VITALS — BP 128/64 | HR 72 | Ht 64.5 in | Wt 157.2 lb

## 2019-04-12 DIAGNOSIS — I480 Paroxysmal atrial fibrillation: Secondary | ICD-10-CM | POA: Diagnosis not present

## 2019-04-12 DIAGNOSIS — I5032 Chronic diastolic (congestive) heart failure: Secondary | ICD-10-CM | POA: Diagnosis not present

## 2019-04-12 DIAGNOSIS — Z79899 Other long term (current) drug therapy: Secondary | ICD-10-CM | POA: Diagnosis not present

## 2019-04-12 DIAGNOSIS — Z7901 Long term (current) use of anticoagulants: Secondary | ICD-10-CM | POA: Diagnosis not present

## 2019-04-12 DIAGNOSIS — K52831 Collagenous colitis: Secondary | ICD-10-CM | POA: Insufficient documentation

## 2019-04-12 DIAGNOSIS — Z8673 Personal history of transient ischemic attack (TIA), and cerebral infarction without residual deficits: Secondary | ICD-10-CM | POA: Diagnosis not present

## 2019-04-12 DIAGNOSIS — I483 Typical atrial flutter: Secondary | ICD-10-CM

## 2019-04-12 DIAGNOSIS — F419 Anxiety disorder, unspecified: Secondary | ICD-10-CM | POA: Diagnosis not present

## 2019-04-12 DIAGNOSIS — E785 Hyperlipidemia, unspecified: Secondary | ICD-10-CM | POA: Diagnosis not present

## 2019-04-12 NOTE — Progress Notes (Signed)
Primary Care Physician: Reynold Bowen, MD Referring Physician: Dr. Rolanda Lundborg is a 78 y.o. female with a h/o paroxysmal afib and aflutter with a Linq in place in the afib clinic on referral from R. Enedina Finner, PA, after finding pt in atrial flutter on last visit there, a couple of weeks ago. August Paceart  report also showed increase in afib at 48% burden. EKG today shows typical atrial flutter  with CVR. Pt was on amiodarone for afib for a short period of time after AVR, 05/2014. She has chronic shortness of breath but has not really noted increase of symptoms over the last few weeks with rhythm changes. She states that she did not tolerate amiodarone well and is not interested in pursing AAD therapy. She does snore with apnea episodes and is willing to undergo a sleep study but would prefer a home study as she feels she would not sleep well.. No alcohol or tobacco use. Recent echo shows left atrium mildly dilated.  Today, she denies symptoms of palpitations, chest pain, + chronic shortness of breath,- orthopnea, PND, lower extremity edema, dizziness, presyncope, syncope, or neurologic sequela. The patient is tolerating medications without difficulties and is otherwise without complaint today.   Past Medical History:  Diagnosis Date  . Anxiety    conditional, taking in preparation for surgery   . 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 Bicuspid aortic valve 05/10/2014  . Chronic diastolic congestive heart failure ([place])   . Collagenous colitis   . Compression, esophagus 2013   stretched in the past   . Encounter for therapeutic drug monitoring 06/15/2014  . Exertional dyspnea 05/10/2014  . Foot swelling    - LEFT-ever since she had an injury as a child   . GERD (gastroesophageal reflux disease)    prevacid on occasion  . H/O hiatal hernia   . HLD (hyperlipidemia)   . Hx of echocardiogram    post AVR >> Echo (12/15):  Mild LVH, EF  55%, AVR ok (Mean gradient (S): 10 mm Hg), mild MR, mild LAE  . Hypothyroidism   . Orthostatic hypotension 05/10/2014  . Postoperative atrial fibrillation ([place]) 06/09/2014  . 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/07/2014   23 mm [facility] Ease bovine pericardial tissue valve  . Severe aortic stenosis 05/21/2014   s/p AVR 05/2014  . TIA (transient ischemic attack)   . UTI (urinary tract infection)    frequent UTI-----pt. reports that she takes Cipro if needed, last UTI- July 2015   Past Surgical History:  Procedure Laterality Date  . AORTIC VALVE REPLACEMENT N/A 06/07/2014   Procedure: AORTIC VALVE REPLACEMENT  (AVR) with 53mm Aortic Perimount Magna Ease;  Surgeon: Rexene Alberts, MD;  Location: [place];  Service: Open Heart Surgery;  Laterality: N/A;  . APPENDECTOMY    . BLADDER SURGERY     x2 for tacking  . CARDIAC CATHETERIZATION    . cardiolite  2003, 2006  . CHOLECYSTECTOMY    . ESOPHAGEAL DILATION    . INTRAOPERATIVE TRANSESOPHAGEAL ECHOCARDIOGRAM N/A 06/07/2014   Procedure: INTRAOPERATIVE TRANSESOPHAGEAL ECHOCARDIOGRAM;  Surgeon: Rexene Alberts, MD;  Location: [place];  Service: Open Heart Surgery;  Laterality: N/A;  . LEFT AND RIGHT HEART CATHETERIZATION WITH CORONARY ANGIOGRAM N/A 05/25/2014   Procedure: LEFT AND RIGHT HEART CATHETERIZATION WITH CORONARY ANGIOGRAM;  Surgeon: Larey Dresser, MD;  Location: [facility] CATH LAB;  Service: Cardiovascular;  Laterality: N/A;  . LOOP RECORDER INSERTION N/A 01/07/2018   Procedure: LOOP RECORDER INSERTION;  Surgeon: Thompson Grayer, MD;  Location: [place] CV LAB;  Service: Cardiovascular;  Laterality: N/A;  . NASAL SEPTUM SURGERY  1995  . THUMB ARTHROSCOPY Left 2006   ? replacement of ligament   . TONSILLECTOMY    . TOTAL ABDOMINAL HYSTERECTOMY     PARTIAL    Current Outpatient Medications  Medication Sig Dispense Refill  . ALPRAZolam (XANAX) 0.25 MG tablet Take 1 tablet (0.25 mg total) by mouth daily as needed for  sleep. 30 tablet 0  . atorvastatin (LIPITOR) 40 MG tablet Take 20 mg by mouth at bedtime.     Marland Kitchen azithromycin (ZITHROMAX) 250 MG tablet Take (2) tablets by mouth 30 minutes before dental procedure 6 each 1  . b complex vitamins tablet Take 1 tablet by mouth daily.     . beta carotene w/minerals (OCUVITE) tablet Take 1 tablet by mouth 2 (two) times daily.     . Biotin 5000 MCG TABS Take 5,000 mcg by mouth 2 (two) times daily.     . cholecalciferol (VITAMIN D) 1000 UNITS tablet Take 2,000 Units by mouth daily.     . Cinnamon 500 MG TABS Take 500 mg by mouth 2 (two) times daily.     . Coenzyme Q10 (COQ-10) 100 MG CAPS Take 100 mg by mouth 2 (two) times daily.     . diphenhydrAMINE (SOMINEX) 25 MG tablet Take 25 mg by mouth at bedtime.     . folic acid (FOLVITE) 347 MCG tablet Take 800 mcg by mouth daily.     Marland Kitchen levothyroxine (SYNTHROID, LEVOTHROID) 100 MCG tablet Take 100 mcg by mouth daily before breakfast.     . Melatonin 3 MG TABS Take 3 mg by mouth at bedtime.     . nitrofurantoin, macrocrystal-monohydrate, (MACROBID) 100 MG capsule Take 100 mg by mouth at bedtime.     Marland Kitchen OLIVE LEAF EXTRACT PO Take 500 mg by mouth 2 (two) times daily.     . Omega-3 Fatty Acids (FISH OIL PO) Take 1,400 mg by mouth 2 (two) times daily.    Marland Kitchen OVER THE COUNTER MEDICATION Take 500 mg by mouth daily. Tumeric 500mg     . RESTASIS 0.05 % ophthalmic emulsion Place 1 drop into both eyes 2 (two) times daily.    Marland Kitchen warfarin (COUMADIN) 2.5 MG tablet TAKE AS DIRECTED BY CVRR 105 tablet 0   No current facility-administered medications for this encounter.     Allergies  Allergen Reactions  . Amiodarone Other (See Comments)    Dizziness, impaired vision (felt like eyes were crossed)  . Clindamycin/Lincomycin Itching and Rash    Full body rash  . Asacol [Mesalamine] Other (See Comments)    GI Upset  Severe diarrhea   . Codeine Nausea And Vomiting  . Pentasa [Mesalamine Er] Other (See Comments)    Severe diarrhea   .  Sulfa Antibiotics Rash  . Amoxicillin Rash    Has patient had a PCN reaction causing immediate rash, facial/tongue/throat swelling, SOB or lightheadedness with hypotension: No Has patient had a PCN reaction causing severe rash involving mucus membranes or skin necrosis: No Has patient had a PCN reaction that required hospitalization: No Has patient had a PCN reaction occurring within the last 10 years: No If all of the above answers are "NO", then may proceed with Cephalosporin use.    Social History   Socioeconomic History  . Marital status: Married  Spouse name: Not on file  . Number of children: Not on file  . Years of education: Not on file  . Highest education level: Not on file  Occupational History  . Not on file  Social Needs  . Financial resource strain: Not on file  . Food insecurity    Worry: Not on file    Inability: Not on file  . Transportation needs    Medical: Not on file    Non-medical: Not on file  Tobacco Use  . Smoking status: Never Smoker  . Smokeless tobacco: Never Used  Substance and Sexual Activity  . Alcohol use: No  . Drug use: No  . Sexual activity: Not on file  Lifestyle  . Physical activity    Days per week: Not on file    Minutes per session: Not on file  . Stress: Not on file  Relationships  . Social Herbalist on phone: Not on file    Gets together: Not on file    Attends religious service: Not on file    Active member of club or organization: Not on file    Attends meetings of clubs or organizations: Not on file    Relationship status: Not on file  . Intimate partner violence    Fear of current or ex partner: Not on file    Emotionally abused: Not on file    Physically abused: Not on file    Forced sexual activity: Not on file  Other Topics Concern  . Not on file  Social History Narrative  . Not on file    Family History  Problem Relation Age of Onset  . CAD Father 72  . Heart attack Father   . Heart attack  Paternal Grandfather   . Stroke Paternal Grandmother   . CVA Other   . Hypertension Neg Hx     ROS- All systems are reviewed and negative except as per the HPI above  Physical Exam: Vitals:   04/12/19 1049  BP: 128/64  Pulse: 72  Weight: 71.3 kg  Height: 5' 4.5" (1.638 m)   Wt Readings from Last 3 Encounters:  04/12/19 71.3 kg  04/01/19 72 kg  12/30/18 69.4 kg    Labs: Lab Results  Component Value Date   NA 141 08/10/2017   K 3.4 (L) 08/10/2017   CL 107 08/10/2017   CO2 16 (L) 08/10/2017   GLUCOSE 104 (H) 08/10/2017   BUN 20 08/10/2017   CREATININE 0.80 08/10/2017   CALCIUM 10.0 08/10/2017   MG 2.0 06/08/2014   Lab Results  Component Value Date   INR 2.0 03/22/2019   Lab Results  Component Value Date   CHOL 117 06/15/2017   HDL 41 06/15/2017   LDLCALC 54 06/15/2017   TRIG 112 06/15/2017     GEN- The patient is well appearing, alert and oriented x 3 today.   Head- normocephalic, atraumatic Eyes-  Sclera clear, conjunctiva pink Ears- hearing intact Oropharynx- clear Neck- supple, no JVP Lymph- no cervical lymphadenopathy Lungs- Clear to ausculation bilaterally, normal work of breathing Heart- mildly irregular egular rate and rhythm, no murmurs, rubs or gallops, PMI not laterally displaced GI- soft, NT, ND, + BS Extremities- no clubbing, cyanosis, or edema MS- no significant deformity or atrophy Skin- no rash or lesion Psych- euthymic mood, full affect Neuro- strength and sensation are intact  EKG-atrial flutter with variable AV block, rate 72 bpm  Echo-1. The left ventricle has low normal systolic function,  with an ejection fraction of 50-55%. The cavity size was mildly dilated. Left ventricular diastolic Doppler parameters are indeterminate.  2. Abnormal septal motion.  3. The right ventricle has normal systolic function. The cavity was normal. There is no increase in right ventricular wall thickness.  4. Left atrial size was mildly dilated.  5.  Right atrial size was mildly dilated.  6. Mild thickening of the mitral valve leaflet.  7. 23 Perimount AVR bioprosthetic Gradients a bit high but stable since echo done 03/19/17 No PVL.  8. The aorta is abnormal unless otherwise noted.  9. There is mild dilatation of the aortic root measuring 38 mm.  Assessment and Plan: 1. Paroxysmal afib/ persistent atrial flutter with CVR since 8/5 Discussed in detail options... DCCV alone, AAD's, pt doesn't want to use amiodarone again, and is not interested in any other meds at this point  Also discussed ablation Pt is very interested in this and wants to pursue with Dr. Rayann Heman This was initially  discussed with him per his  note of 12/24/17 when decision to place a linq was made to further align symptoms with arrhythmia  She would like an appointment ASAP and wants to get started on weekly INR's CHA2DS2VASc score of 5 Will have this started tomorrow at 1:30 pm with the [facility] street coumadin clinic ( normally followed there) Recent echo shows only mildly dilated left atrium She does snore with witnessed apnea and will refer for a sleep study, she would prefer home version  Butch Penny C. , [facility] [address] [place], [place] [address] [phone]

## 2019-04-13 ENCOUNTER — Other Ambulatory Visit: Payer: Self-pay | Admitting: Cardiology

## 2019-04-13 ENCOUNTER — Ambulatory Visit (INDEPENDENT_AMBULATORY_CARE_PROVIDER_SITE_OTHER): Payer: Medicare Other

## 2019-04-13 DIAGNOSIS — I9789 Other postprocedural complications and disorders of the circulatory system, not elsewhere classified: Secondary | ICD-10-CM

## 2019-04-13 DIAGNOSIS — I4891 Unspecified atrial fibrillation: Secondary | ICD-10-CM | POA: Diagnosis not present

## 2019-04-13 DIAGNOSIS — Q231 Congenital insufficiency of aortic valve: Secondary | ICD-10-CM

## 2019-04-13 DIAGNOSIS — Z5181 Encounter for therapeutic drug level monitoring: Secondary | ICD-10-CM | POA: Diagnosis not present

## 2019-04-13 DIAGNOSIS — Z953 Presence of xenogenic heart valve: Secondary | ICD-10-CM | POA: Diagnosis not present

## 2019-04-13 LAB — POCT INR: INR: 3.8 — AB (ref 2.0–3.0)

## 2019-04-13 NOTE — Progress Notes (Signed)
Carelink Summary Report / Loop Recorder 

## 2019-04-13 NOTE — Patient Instructions (Signed)
Description   Skip today's dosage of Coumadin, then resume same dosage 1 tablet daily except 1.5 tablets on Tuesdays. Recheck INR in 1 week pending possible ablation, not scheduled yet. Call with any questions. Coumadin Clinic [phone]

## 2019-04-14 ENCOUNTER — Ambulatory Visit (INDEPENDENT_AMBULATORY_CARE_PROVIDER_SITE_OTHER): Payer: Medicare Other | Admitting: *Deleted

## 2019-04-14 DIAGNOSIS — I480 Paroxysmal atrial fibrillation: Secondary | ICD-10-CM

## 2019-04-14 DIAGNOSIS — I5032 Chronic diastolic (congestive) heart failure: Secondary | ICD-10-CM

## 2019-04-14 LAB — CUP PACEART REMOTE DEVICE CHECK
Date Time Interrogation Session: 20200820201232
Implantable Pulse Generator Implant Date: 20190516

## 2019-04-19 ENCOUNTER — Encounter (HOSPITAL_COMMUNITY): Payer: Self-pay | Admitting: Nurse Practitioner

## 2019-04-19 NOTE — Addendum Note (Signed)
Encounter addended by: Sherran Needs, NP on: 04/19/2019 8:41 AM  Actions taken: Problem List reviewed, Allergies reviewed, Medication List reviewed, Level of Service modified

## 2019-04-19 NOTE — Addendum Note (Signed)
Encounter addended by: Sherran Needs, NP on: 04/19/2019 8:43 AM  Actions taken: Medication List reviewed, Problem List reviewed, Allergies reviewed

## 2019-04-19 NOTE — Addendum Note (Signed)
Encounter addended by: Sherran Needs, NP on: 04/19/2019 8:44 AM  Actions taken: Medication List reviewed, Problem List reviewed, Allergies reviewed

## 2019-04-20 ENCOUNTER — Telehealth: Payer: Self-pay

## 2019-04-20 NOTE — Telephone Encounter (Signed)
Spoke with pt about appt on 04/21/19. Pt stated she would rather do a telephone visit. Pt was advise to check vitals prior to appt. Pt questions were address.

## 2019-04-21 ENCOUNTER — Telehealth (INDEPENDENT_AMBULATORY_CARE_PROVIDER_SITE_OTHER): Payer: Medicare Other | Admitting: Internal Medicine

## 2019-04-21 ENCOUNTER — Ambulatory Visit (INDEPENDENT_AMBULATORY_CARE_PROVIDER_SITE_OTHER): Payer: Medicare Other

## 2019-04-21 ENCOUNTER — Encounter: Payer: Self-pay | Admitting: Internal Medicine

## 2019-04-21 ENCOUNTER — Other Ambulatory Visit: Payer: Self-pay

## 2019-04-21 VITALS — BP 108/64 | HR 69 | Ht 64.5 in | Wt 156.0 lb

## 2019-04-21 DIAGNOSIS — Z953 Presence of xenogenic heart valve: Secondary | ICD-10-CM | POA: Diagnosis not present

## 2019-04-21 DIAGNOSIS — Q231 Congenital insufficiency of aortic valve: Secondary | ICD-10-CM | POA: Diagnosis not present

## 2019-04-21 DIAGNOSIS — I483 Typical atrial flutter: Secondary | ICD-10-CM | POA: Diagnosis not present

## 2019-04-21 DIAGNOSIS — I4819 Other persistent atrial fibrillation: Secondary | ICD-10-CM | POA: Diagnosis not present

## 2019-04-21 DIAGNOSIS — I9789 Other postprocedural complications and disorders of the circulatory system, not elsewhere classified: Secondary | ICD-10-CM

## 2019-04-21 DIAGNOSIS — Z952 Presence of prosthetic heart valve: Secondary | ICD-10-CM

## 2019-04-21 DIAGNOSIS — I2510 Atherosclerotic heart disease of native coronary artery without angina pectoris: Secondary | ICD-10-CM | POA: Diagnosis not present

## 2019-04-21 DIAGNOSIS — Z5181 Encounter for therapeutic drug level monitoring: Secondary | ICD-10-CM

## 2019-04-21 DIAGNOSIS — I4891 Unspecified atrial fibrillation: Secondary | ICD-10-CM | POA: Diagnosis not present

## 2019-04-21 LAB — POCT INR: INR: 2.6 (ref 2.0–3.0)

## 2019-04-21 MED ORDER — RIVAROXABAN 20 MG PO TABS: 20.0000 mg | ORAL_TABLET | Freq: Every day | ORAL | 0 refills | Status: DC

## 2019-04-21 NOTE — H&P (View-Only) (Signed)
Electrophysiology TeleHealth Note  Due to national recommendations of social distancing due to [place] 19, an audio telehealth visit is felt to be most appropriate for this patient at this time.  Verbal consent was obtained by me for the telehealth visit today.  The patient does not have capability for a virtual visit.  A phone visit is therefore required today.   Date:  04/21/2019   ID:  Becky Winters, DOB 09/15/1940, MRN [id number]  Location: patient's home  Provider location:  [facility]  Evaluation Performed: Follow-up visit  PCP:  Reynold Bowen, MD   Electrophysiologist:  Dr Rayann Heman  Chief Complaint:  palpitations  History of Present Illness:    Becky Winters is a 78 y.o. female who presents via telehealth conferencing today.  Since last being seen in our clinic, the patient reports doing reasonably well.  She reports symptoms of fatigue and decreased exercise tolerance.  She states "I feel very tired and very old".  Today, she denies symptoms of palpitations, chest pain, shortness of breath,  lower extremity edema, dizziness, presyncope, or syncope.  The patient is otherwise without complaint today.  The patient denies symptoms of fevers, chills, cough, or new SOB worrisome for COVID 19.  Past Medical History:  Diagnosis Date  . Anxiety    conditional, taking in preparation for surgery   . 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 Bicuspid aortic valve 05/10/2014  . Chronic diastolic congestive heart failure ([place])   . Collagenous colitis   . Compression, esophagus 2013   stretched in the past   . Encounter for therapeutic drug monitoring 06/15/2014  . Exertional dyspnea 05/10/2014  . Foot swelling    - LEFT-ever since she had an injury as a child   . GERD (gastroesophageal reflux disease)    prevacid on occasion  . H/O hiatal hernia   . HLD (hyperlipidemia)   . Hx of echocardiogram    post AVR >> Echo (12/15):  Mild  LVH, EF 55%, AVR ok (Mean gradient (S): 10 mm Hg), mild MR, mild LAE  . Hypothyroidism   . Orthostatic hypotension 05/10/2014  . Postoperative atrial fibrillation ([place]) 06/09/2014  . 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/07/2014   23 mm [facility] Ease bovine pericardial tissue valve  . Severe aortic stenosis 05/21/2014   s/p AVR 05/2014  . TIA (transient ischemic attack)   . UTI (urinary tract infection)    frequent UTI-----pt. reports that she takes Cipro if needed, last UTI- July 2015    Past Surgical History:  Procedure Laterality Date  . AORTIC VALVE REPLACEMENT N/A 06/07/2014   Procedure: AORTIC VALVE REPLACEMENT  (AVR) with 53mm Aortic Perimount Magna Ease;  Surgeon: Rexene Alberts, MD;  Location: [place];  Service: Open Heart Surgery;  Laterality: N/A;  . APPENDECTOMY    . BLADDER SURGERY     x2 for tacking  . CARDIAC CATHETERIZATION    . cardiolite  2003, 2006  . CHOLECYSTECTOMY    . ESOPHAGEAL DILATION    . INTRAOPERATIVE TRANSESOPHAGEAL ECHOCARDIOGRAM N/A 06/07/2014   Procedure: INTRAOPERATIVE TRANSESOPHAGEAL ECHOCARDIOGRAM;  Surgeon: Rexene Alberts, MD;  Location: [place];  Service: Open Heart Surgery;  Laterality: N/A;  . LEFT AND RIGHT HEART CATHETERIZATION WITH CORONARY ANGIOGRAM N/A 05/25/2014   Procedure: LEFT AND RIGHT HEART CATHETERIZATION WITH CORONARY ANGIOGRAM;  Surgeon: Larey Dresser, MD;  Location: [facility] CATH LAB;  Service: Cardiovascular;  Laterality: N/A;  . LOOP RECORDER INSERTION N/A 01/07/2018   Procedure: LOOP RECORDER INSERTION;  Surgeon: Thompson Grayer, MD;  Location: [place] CV LAB;  Service: Cardiovascular;  Laterality: N/A;  . NASAL SEPTUM SURGERY  1995  . THUMB ARTHROSCOPY Left 2006   ? replacement of ligament   . TONSILLECTOMY    . TOTAL ABDOMINAL HYSTERECTOMY     PARTIAL    Current Outpatient Medications  Medication Sig Dispense Refill  . ALPRAZolam (XANAX) 0.25 MG tablet Take 1 tablet (0.25 mg total) by mouth daily as  needed for sleep. 30 tablet 0  . atorvastatin (LIPITOR) 40 MG tablet Take 20 mg by mouth at bedtime.     Marland Kitchen azithromycin (ZITHROMAX) 250 MG tablet Take (2) tablets by mouth 30 minutes before dental procedure 6 each 1  . b complex vitamins tablet Take 1 tablet by mouth daily.     . beta carotene w/minerals (OCUVITE) tablet Take 1 tablet by mouth 2 (two) times daily.     Marland Kitchen BIOTIN PO Take 10,000 mcg by mouth 2 (two) times daily.    . Cinnamon 500 MG TABS Take 500 mg by mouth 2 (two) times daily.     . Coenzyme Q10 (COQ-10) 100 MG CAPS Take 100 mg by mouth 2 (two) times daily.     . diphenhydrAMINE (SOMINEX) 25 MG tablet Take 25 mg by mouth at bedtime.     . folic acid (FOLVITE) [id number] MCG tablet Take 800 mcg by mouth daily.     Marland Kitchen levothyroxine (SYNTHROID, LEVOTHROID) 100 MCG tablet Take 100 mcg by mouth daily before breakfast.     . Melatonin 3 MG TABS Take 3 mg by mouth at bedtime.     . nitrofurantoin, macrocrystal-monohydrate, (MACROBID) 100 MG capsule Take 100 mg by mouth at bedtime.     Marland Kitchen OLIVE LEAF EXTRACT PO Take 500 mg by mouth 2 (two) times daily.     . Omega-3 Fatty Acids (FISH OIL PO) Take 1,400 mg by mouth 2 (two) times daily.    Marland Kitchen OVER THE COUNTER MEDICATION Take 500 mg by mouth 3 (three) times a week. Tumeric 500mg      . RESTASIS 0.05 % ophthalmic emulsion Place 1 drop into both eyes 2 (two) times daily.    Marland Kitchen VITAMIN D PO Take 2,000 Units by mouth 2 (two) times daily.    Marland Kitchen warfarin (COUMADIN) 2.5 MG tablet TAKE AS DIRECTED BY CVRR 105 tablet 0   No current facility-administered medications for this visit.     Allergies:   Amiodarone, Clindamycin/lincomycin, Asacol [mesalamine], Codeine, Pentasa [mesalamine er], Sulfa antibiotics, and Amoxicillin   Social History:  The patient  reports that she has never smoked. She has never used smokeless tobacco. She reports that she does not drink alcohol or use drugs.   Family History:  The patient's  family history includes CAD (age of onset:  82) in her father; CVA in an other family member; Heart attack in her father and paternal grandfather; Stroke in her paternal grandmother.   ROS:  Please see the history of present illness.   All other systems are personally reviewed and negative.    Exam:    Vital Signs:  BP 108/64   Pulse 69   Ht 5' 4.5" (1.638 m)   Wt 156 lb (70.8 kg)   BMI 26.36 kg/m   Well sounding   Labs/Other Tests and Data Reviewed:    Recent Labs: No results found for requested labs  within last 8760 hours.   Wt Readings from Last 3 Encounters:  04/21/19 156 lb (70.8 kg)  04/12/19 157 lb 3.2 oz (71.3 kg)  04/01/19 158 lb 12.8 oz (72 kg)     Last device remote is reviewed from [place] PDF which reveals that her AF burden has increased.  21% from 8%.  She reports that this corresponds to her symptoms.  Echo 04/07/2019 reviewed AF clinic notes are reviewed  ASSESSMENT & PLAN:    1.  Paroxysmal atrial fibrillation/ typical atrial flutter The patient has symptomatic, recurrent persistent atrial fibrillation and typical atrial flutter. she has failed medical therapy with amiodarone Chads2vasc score is 5.  she is anticoagulated with coumadin.  INRs have been labile.  I will therefore switch to xarelto prior to ablation. . Therapeutic strategies for afib including medicine and ablation were discussed in detail with the patient today. Risk, benefits, and alternatives to EP study and radiofrequency ablation for afib were also discussed in detail today. These risks include but are not limited to stroke, bleeding, vascular damage, tamponade, perforation, damage to the esophagus, lungs, and other structures, pulmonary vein stenosis, worsening renal function, and death. The patient understands these risk and wishes to proceed.  We will therefore proceed with catheter ablation at the next available time.  Carto, ICE, anesthesia are requested for the procedure.  Will also obtain cardiac CT prior to the procedure to  exclude LAA thrombus and further evaluate atrial anatomy.  2. Snoring She has been referred for sleep study by AF clinic   Patient Risk:  after full review of this patients clinical status, I feel that they are at high risk at this time.  Today, I have spent 25 minutes with the patient with telehealth technology discussing arrhythmia management .    Army Fossa, MD  04/21/2019 9:44 AM     [place] [address] [phone] (office) [phone] (fax)

## 2019-04-21 NOTE — Progress Notes (Signed)
Electrophysiology TeleHealth Note  Due to national recommendations of social distancing due to [place] 19, an audio telehealth visit is felt to be most appropriate for this patient at this time.  Verbal consent was obtained by me for the telehealth visit today.  The patient does not have capability for a virtual visit.  A phone visit is therefore required today.   Date:  04/21/2019   ID:  Becky Winters, DOB 29-Mar-1941, MRN [id number]  Location: patient's home  Provider location:  [facility]  Evaluation Performed: Follow-up visit  PCP:  Reynold Bowen, MD   Electrophysiologist:  Dr Rayann Heman  Chief Complaint:  palpitations  History of Present Illness:    Becky Winters is a 78 y.o. female who presents via telehealth conferencing today.  Since last being seen in our clinic, the patient reports doing reasonably well.  She reports symptoms of fatigue and decreased exercise tolerance.  She states "I feel very tired and very old".  Today, she denies symptoms of palpitations, chest pain, shortness of breath,  lower extremity edema, dizziness, presyncope, or syncope.  The patient is otherwise without complaint today.  The patient denies symptoms of fevers, chills, cough, or new SOB worrisome for COVID 19.  Past Medical History:  Diagnosis Date  . Anxiety    conditional, taking in preparation for surgery   . 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 Bicuspid aortic valve 05/10/2014  . Chronic diastolic congestive heart failure ([place])   . Collagenous colitis   . Compression, esophagus 2013   stretched in the past   . Encounter for therapeutic drug monitoring 06/15/2014  . Exertional dyspnea 05/10/2014  . Foot swelling    - LEFT-ever since she had an injury as a child   . GERD (gastroesophageal reflux disease)    prevacid on occasion  . H/O hiatal hernia   . HLD (hyperlipidemia)   . Hx of echocardiogram    post AVR >> Echo (12/15):  Mild  LVH, EF 55%, AVR ok (Mean gradient (S): 10 mm Hg), mild MR, mild LAE  . Hypothyroidism   . Orthostatic hypotension 05/10/2014  . Postoperative atrial fibrillation ([place]) 06/09/2014  . 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/07/2014   23 mm [facility] Ease bovine pericardial tissue valve  . Severe aortic stenosis 05/21/2014   s/p AVR 05/2014  . TIA (transient ischemic attack)   . UTI (urinary tract infection)    frequent UTI-----pt. reports that she takes Cipro if needed, last UTI- July 2015    Past Surgical History:  Procedure Laterality Date  . AORTIC VALVE REPLACEMENT N/A 06/07/2014   Procedure: AORTIC VALVE REPLACEMENT  (AVR) with 39mm Aortic Perimount Magna Ease;  Surgeon: Rexene Alberts, MD;  Location: [place];  Service: Open Heart Surgery;  Laterality: N/A;  . APPENDECTOMY    . BLADDER SURGERY     x2 for tacking  . CARDIAC CATHETERIZATION    . cardiolite  2003, 2006  . CHOLECYSTECTOMY    . ESOPHAGEAL DILATION    . INTRAOPERATIVE TRANSESOPHAGEAL ECHOCARDIOGRAM N/A 06/07/2014   Procedure: INTRAOPERATIVE TRANSESOPHAGEAL ECHOCARDIOGRAM;  Surgeon: Rexene Alberts, MD;  Location: [place];  Service: Open Heart Surgery;  Laterality: N/A;  . LEFT AND RIGHT HEART CATHETERIZATION WITH CORONARY ANGIOGRAM N/A 05/25/2014   Procedure: LEFT AND RIGHT HEART CATHETERIZATION WITH CORONARY ANGIOGRAM;  Surgeon: Larey Dresser, MD;  Location: [facility] CATH LAB;  Service: Cardiovascular;  Laterality: N/A;  . LOOP RECORDER INSERTION N/A 01/07/2018   Procedure: LOOP RECORDER INSERTION;  Surgeon: Thompson Grayer, MD;  Location: [place] CV LAB;  Service: Cardiovascular;  Laterality: N/A;  . NASAL SEPTUM SURGERY  1995  . THUMB ARTHROSCOPY Left 2006   ? replacement of ligament   . TONSILLECTOMY    . TOTAL ABDOMINAL HYSTERECTOMY     PARTIAL    Current Outpatient Medications  Medication Sig Dispense Refill  . ALPRAZolam (XANAX) 0.25 MG tablet Take 1 tablet (0.25 mg total) by mouth daily as  needed for sleep. 30 tablet 0  . atorvastatin (LIPITOR) 40 MG tablet Take 20 mg by mouth at bedtime.     Marland Kitchen azithromycin (ZITHROMAX) 250 MG tablet Take (2) tablets by mouth 30 minutes before dental procedure 6 each 1  . b complex vitamins tablet Take 1 tablet by mouth daily.     . beta carotene w/minerals (OCUVITE) tablet Take 1 tablet by mouth 2 (two) times daily.     Marland Kitchen BIOTIN PO Take 10,000 mcg by mouth 2 (two) times daily.    . Cinnamon 500 MG TABS Take 500 mg by mouth 2 (two) times daily.     . Coenzyme Q10 (COQ-10) 100 MG CAPS Take 100 mg by mouth 2 (two) times daily.     . diphenhydrAMINE (SOMINEX) 25 MG tablet Take 25 mg by mouth at bedtime.     . folic acid (FOLVITE) [id number] MCG tablet Take 800 mcg by mouth daily.     Marland Kitchen levothyroxine (SYNTHROID, LEVOTHROID) 100 MCG tablet Take 100 mcg by mouth daily before breakfast.     . Melatonin 3 MG TABS Take 3 mg by mouth at bedtime.     . nitrofurantoin, macrocrystal-monohydrate, (MACROBID) 100 MG capsule Take 100 mg by mouth at bedtime.     Marland Kitchen OLIVE LEAF EXTRACT PO Take 500 mg by mouth 2 (two) times daily.     . Omega-3 Fatty Acids (FISH OIL PO) Take 1,400 mg by mouth 2 (two) times daily.    Marland Kitchen OVER THE COUNTER MEDICATION Take 500 mg by mouth 3 (three) times a week. Tumeric 500mg      . RESTASIS 0.05 % ophthalmic emulsion Place 1 drop into both eyes 2 (two) times daily.    Marland Kitchen VITAMIN D PO Take 2,000 Units by mouth 2 (two) times daily.    Marland Kitchen warfarin (COUMADIN) 2.5 MG tablet TAKE AS DIRECTED BY CVRR 105 tablet 0   No current facility-administered medications for this visit.     Allergies:   Amiodarone, Clindamycin/lincomycin, Asacol [mesalamine], Codeine, Pentasa [mesalamine er], Sulfa antibiotics, and Amoxicillin   Social History:  The patient  reports that she has never smoked. She has never used smokeless tobacco. She reports that she does not drink alcohol or use drugs.   Family History:  The patient's  family history includes CAD (age of onset:  42) in her father; CVA in an other family member; Heart attack in her father and paternal grandfather; Stroke in her paternal grandmother.   ROS:  Please see the history of present illness.   All other systems are personally reviewed and negative.    Exam:    Vital Signs:  BP 108/64   Pulse 69   Ht 5' 4.5" (1.638 m)   Wt 156 lb (70.8 kg)   BMI 26.36 kg/m   Well sounding   Labs/Other Tests and Data Reviewed:    Recent Labs: No results found for requested labs  within last 8760 hours.   Wt Readings from Last 3 Encounters:  04/21/19 156 lb (70.8 kg)  04/12/19 157 lb 3.2 oz (71.3 kg)  04/01/19 158 lb 12.8 oz (72 kg)     Last device remote is reviewed from [facility] which reveals that her AF burden has increased.  21% from 8%.  She reports that this corresponds to her symptoms.  Echo 04/07/2019 reviewed AF clinic notes are reviewed  ASSESSMENT & PLAN:    1.  Paroxysmal atrial fibrillation/ typical atrial flutter The patient has symptomatic, recurrent persistent atrial fibrillation and typical atrial flutter. she has failed medical therapy with amiodarone Chads2vasc score is 5.  she is anticoagulated with coumadin.  INRs have been labile.  I will therefore switch to xarelto prior to ablation. . Therapeutic strategies for afib including medicine and ablation were discussed in detail with the patient today. Risk, benefits, and alternatives to EP study and radiofrequency ablation for afib were also discussed in detail today. These risks include but are not limited to stroke, bleeding, vascular damage, tamponade, perforation, damage to the esophagus, lungs, and other structures, pulmonary vein stenosis, worsening renal function, and death. The patient understands these risk and wishes to proceed.  We will therefore proceed with catheter ablation at the next available time.  Carto, ICE, anesthesia are requested for the procedure.  Will also obtain cardiac CT prior to the procedure to  exclude LAA thrombus and further evaluate atrial anatomy.  2. Snoring She has been referred for sleep study by AF clinic   Patient Risk:  after full review of this patients clinical status, I feel that they are at high risk at this time.  Today, I have spent 25 minutes with the patient with telehealth technology discussing arrhythmia management .    Army Fossa, MD  04/21/2019 9:44 AM     [address] [place] [address] [phone] (office) [phone] (fax)

## 2019-04-21 NOTE — Patient Instructions (Signed)
Stop Warfarin, start taking Xarelto 20mg  QD with largest meal of the day tonight.  A full discussion of the nature of anticoagulants has been carried out.  A benefit/risk analysis has been presented to the patient, so that they understand the justification for choosing anticoagulation with Xarelto at this time.  The need for compliance is stressed.  Pt is aware to take the medication once daily with the largest meal of the day.  Side effects of potential bleeding are discussed, including unusual colored urine or stools, coughing up blood or coffee ground emesis, nose bleeds or serious fall or head trauma.  Discussed signs and symptoms of stroke. The patient should avoid any OTC items containing aspirin or ibuprofen.  Avoid alcohol consumption.   Call if any signs of abnormal bleeding.  Discussed financial obligations and resolved any difficulty in obtaining medication.

## 2019-04-22 ENCOUNTER — Telehealth: Payer: Self-pay | Admitting: *Deleted

## 2019-04-22 NOTE — Telephone Encounter (Signed)
Staff message sent to Gae Bon ok to schedule sleep study. Patient has Medicare and does not require getting a sleep study.

## 2019-04-22 NOTE — Progress Notes (Signed)
Carelink Summary Report / Loop Recorder 

## 2019-04-25 ENCOUNTER — Telehealth: Payer: Self-pay

## 2019-04-25 ENCOUNTER — Other Ambulatory Visit: Payer: Self-pay

## 2019-04-25 DIAGNOSIS — I4891 Unspecified atrial fibrillation: Secondary | ICD-10-CM

## 2019-04-25 DIAGNOSIS — I4811 Longstanding persistent atrial fibrillation: Secondary | ICD-10-CM

## 2019-04-25 DIAGNOSIS — Z01810 Encounter for preprocedural cardiovascular examination: Secondary | ICD-10-CM

## 2019-04-25 MED ORDER — METOPROLOL TARTRATE 100 MG PO TABS: 100.0000 mg | ORAL_TABLET | Freq: Once | ORAL | 0 refills | Status: DC

## 2019-04-25 NOTE — Telephone Encounter (Signed)
LM for pt to call our office back to schedule her Afib Ablation. Please transfer call to Rockney Ghee if she calls back 04/25/19.

## 2019-04-25 NOTE — Telephone Encounter (Addendum)
Spoke with the Becky Winters and she is scheduled for AFIB ablation 05/19/19 at 10:30am for 8:30am arrival.   COVID testing 05/16/19 at [facility] and labs.   Becky Winters started her Xarelto 04/21/19 after her last INR that same day 2.6. She takes it at [facility].    Pre-procedure Cardiac CT ordered and sent to [facility] for scheduling.   Follow up with Roderic Palau NP in the  AFIB clinic 06/16/19 at 9:30am... code for garage [id number].

## 2019-04-25 NOTE — Telephone Encounter (Signed)
Follow up     Pt scheduled for 3 month f/u after afib ablation. Pt ask if she needs to take an antibiotic like she does before she goes to the dentist for this procedure. Please call.

## 2019-04-26 ENCOUNTER — Other Ambulatory Visit: Payer: Self-pay

## 2019-04-26 MED ORDER — RIVAROXABAN 20 MG PO TABS: 20.0000 mg | ORAL_TABLET | Freq: Every day | ORAL | 1 refills | Status: DC

## 2019-04-26 NOTE — Telephone Encounter (Signed)
Send 90 day rx to [place] for Xarelto 20mg  tabs  Pt last saw Dr Rayann Heman 04/21/19 telemedicine Covid-19, last labs 10/29/18 Dr Forde Dandy Creat 0.9, age 78, weight 70.8kg, CrCl 60.37, based on CrCl pt is on appropriate dosage of Xarelto 20mg  QD. Will refill 20mg  Xarelto QD.

## 2019-05-03 ENCOUNTER — Telehealth: Payer: Self-pay | Admitting: *Deleted

## 2019-05-03 NOTE — Telephone Encounter (Signed)
Patient is aware and agreeable to Home Sleep Study through [facility]. Patient is scheduled for 10/30 at 11 AM to pick up home sleep kit and meet with Respiratory therapist at [facility]. Patient is aware that if this appointment date and time does not work for them they should contact Artis Delay directly at [phone]. Patient is aware that a sleep packet will be sent from [facility] in week. Patient is agreeable to treatment and thankful for call.

## 2019-05-03 NOTE — Telephone Encounter (Signed)
-----   Message from Lauralee Evener, [place] sent at 04/22/2019  2:09 PM EDT ----- Regarding: RE: sleep study Ok to schedule sleep study. No PA is required. ----- Message ----- From: Juluis Mire, RN Sent: 04/12/2019  11:45 AM EDT To: Cv Div Sleep Studies Subject: sleep study                                    Pt needs sleep study prefers home sleep study for afib, snoring per donna carroll Thanks [facility]

## 2019-05-06 DIAGNOSIS — E039 Hypothyroidism, unspecified: Secondary | ICD-10-CM | POA: Diagnosis not present

## 2019-05-06 DIAGNOSIS — I2510 Atherosclerotic heart disease of native coronary artery without angina pectoris: Secondary | ICD-10-CM | POA: Diagnosis not present

## 2019-05-06 DIAGNOSIS — I480 Paroxysmal atrial fibrillation: Secondary | ICD-10-CM | POA: Diagnosis not present

## 2019-05-06 DIAGNOSIS — Z952 Presence of prosthetic heart valve: Secondary | ICD-10-CM | POA: Diagnosis not present

## 2019-05-10 ENCOUNTER — Telehealth (HOSPITAL_COMMUNITY): Payer: Self-pay | Admitting: Emergency Medicine

## 2019-05-10 NOTE — Telephone Encounter (Signed)
Reaching out to patient to offer assistance regarding upcoming cardiac imaging study; pt verbalizes understanding of appt date/time, parking situation and where to check in, pre-test NPO status and medications ordered, and verified current allergies; name and call back number provided for further questions should they arise Marchia Bond RN Navigator Cardiac Imaging [facility] [phone] office [phone] cell  Pt concerned about metoprolol dose, I instructed her not to take it since her BP trends low. Pt verbalized understanding

## 2019-05-12 ENCOUNTER — Encounter (HOSPITAL_COMMUNITY): Payer: Self-pay

## 2019-05-12 ENCOUNTER — Ambulatory Visit (HOSPITAL_COMMUNITY): Admission: RE | Admit: 2019-05-12 | Payer: Medicare Other | Source: Ambulatory Visit

## 2019-05-12 ENCOUNTER — Other Ambulatory Visit: Payer: Self-pay

## 2019-05-12 ENCOUNTER — Ambulatory Visit (HOSPITAL_COMMUNITY)
Admission: RE | Admit: 2019-05-12 | Discharge: 2019-05-12 | Disposition: A | Discharge status: Home / Self Care | Payer: Medicare Other | Source: Ambulatory Visit | Attending: Internal Medicine | Admitting: Internal Medicine

## 2019-05-12 DIAGNOSIS — I4891 Unspecified atrial fibrillation: Secondary | ICD-10-CM | POA: Insufficient documentation

## 2019-05-12 LAB — POCT I-STAT CREATININE: Creatinine, Ser: 0.9 mg/dL (ref 0.44–1.00)

## 2019-05-12 MED ORDER — IOHEXOL 350 MG/ML SOLN
80.0000 mL | Freq: Once | INTRAVENOUS | Status: AC | PRN
  Administered 2019-05-12: 80 mL via INTRAVENOUS

## 2019-05-16 ENCOUNTER — Other Ambulatory Visit (HOSPITAL_COMMUNITY)
Admission: RE | Admit: 2019-05-16 | Discharge: 2019-05-16 | Disposition: A | Discharge status: Home / Self Care | Payer: Medicare Other | Source: Ambulatory Visit | Attending: Internal Medicine | Admitting: Internal Medicine

## 2019-05-16 ENCOUNTER — Telehealth: Payer: Self-pay

## 2019-05-16 ENCOUNTER — Other Ambulatory Visit: Payer: Medicare Other | Admitting: *Deleted

## 2019-05-16 ENCOUNTER — Other Ambulatory Visit: Payer: Self-pay

## 2019-05-16 DIAGNOSIS — I4811 Longstanding persistent atrial fibrillation: Secondary | ICD-10-CM | POA: Diagnosis not present

## 2019-05-16 DIAGNOSIS — Z01812 Encounter for preprocedural laboratory examination: Secondary | ICD-10-CM | POA: Diagnosis not present

## 2019-05-16 DIAGNOSIS — Z20828 Contact with and (suspected) exposure to other viral communicable diseases: Secondary | ICD-10-CM | POA: Diagnosis not present

## 2019-05-16 DIAGNOSIS — Z01810 Encounter for preprocedural cardiovascular examination: Secondary | ICD-10-CM

## 2019-05-16 LAB — BASIC METABOLIC PANEL
BUN/Creatinine Ratio: 16 (ref 12–28)
BUN: 15 mg/dL (ref 8–27)
CO2: 21 mmol/L (ref 20–29)
Calcium: 9.4 mg/dL (ref 8.7–10.3)
Chloride: 104 mmol/L (ref 96–106)
Creatinine, Ser: 0.95 mg/dL (ref 0.57–1.00)
GFR calc Af Amer: 68 mL/min/{1.73_m2} (ref >59)
GFR calc non Af Amer: 59 mL/min/{1.73_m2} — ABNORMAL LOW (ref >59)
Glucose: 95 mg/dL (ref 65–99)
Potassium: 4.4 mmol/L (ref 3.5–5.2)
Sodium: 141 mmol/L (ref 134–144)

## 2019-05-16 LAB — CBC WITH DIFFERENTIAL/PLATELET
Basophils Absolute: 0.1 10*3/uL (ref 0.0–0.2)
Basos: 1 %
EOS (ABSOLUTE): 0.1 10*3/uL (ref 0.0–0.4)
Eos: 1 %
Hematocrit: 42.6 % (ref 34.0–46.6)
Hemoglobin: 14.5 g/dL (ref 11.1–15.9)
Immature Grans (Abs): 0 10*3/uL (ref 0.0–0.1)
Immature Granulocytes: 0 %
Lymphocytes Absolute: 2.4 10*3/uL (ref 0.7–3.1)
Lymphs: 24 %
MCH: 29.1 pg (ref 26.6–33.0)
MCHC: 34 g/dL (ref 31.5–35.7)
MCV: 86 fL (ref 79–97)
Monocytes Absolute: 0.8 10*3/uL (ref 0.1–0.9)
Monocytes: 8 %
Neutrophils Absolute: 6.8 10*3/uL (ref 1.4–7.0)
Neutrophils: 66 %
Platelets: 209 10*3/uL (ref 150–450)
RBC: 4.98 x10E6/uL (ref 3.77–5.28)
RDW: 12 % (ref 11.7–15.4)
WBC: 10.1 10*3/uL (ref 3.4–10.8)

## 2019-05-16 NOTE — Telephone Encounter (Signed)
Call placed to Pt.  Explained lab appt times on Mychart.  Also confirmed Pt had cardiac CT.  Pt will stop checking her MyChart.  Advised to KEEP her MyChart in case of any complications so she can message this  Nurse.

## 2019-05-17 ENCOUNTER — Ambulatory Visit (INDEPENDENT_AMBULATORY_CARE_PROVIDER_SITE_OTHER): Payer: Medicare Other | Admitting: *Deleted

## 2019-05-17 DIAGNOSIS — I5032 Chronic diastolic (congestive) heart failure: Secondary | ICD-10-CM

## 2019-05-17 DIAGNOSIS — I480 Paroxysmal atrial fibrillation: Secondary | ICD-10-CM

## 2019-05-17 LAB — CUP PACEART REMOTE DEVICE CHECK
Date Time Interrogation Session: 20200922200840
Implantable Pulse Generator Implant Date: 20190516

## 2019-05-18 LAB — NOVEL CORONAVIRUS, NAA (HOSP ORDER, SEND-OUT TO REF LAB; TAT 18-24 HRS): SARS-CoV-2, NAA: NOT DETECTED

## 2019-05-19 ENCOUNTER — Encounter (HOSPITAL_COMMUNITY): Payer: Self-pay | Admitting: Internal Medicine

## 2019-05-19 ENCOUNTER — Ambulatory Visit (HOSPITAL_COMMUNITY): Payer: Medicare Other | Admitting: Certified Registered"

## 2019-05-19 ENCOUNTER — Other Ambulatory Visit: Payer: Self-pay

## 2019-05-19 ENCOUNTER — Ambulatory Visit (HOSPITAL_COMMUNITY)
Admission: RE | Admit: 2019-05-19 | Discharge: 2019-05-19 | Disposition: A | Discharge status: Home / Self Care | Payer: Medicare Other | Attending: Internal Medicine | Admitting: Internal Medicine

## 2019-05-19 ENCOUNTER — Encounter (HOSPITAL_COMMUNITY)
Admission: RE | Disposition: A | Discharge status: Home / Self Care | Payer: Medicare Other | Source: Home / Self Care | Attending: Internal Medicine

## 2019-05-19 DIAGNOSIS — Z7901 Long term (current) use of anticoagulants: Secondary | ICD-10-CM | POA: Insufficient documentation

## 2019-05-19 DIAGNOSIS — Z953 Presence of xenogenic heart valve: Secondary | ICD-10-CM | POA: Diagnosis not present

## 2019-05-19 DIAGNOSIS — Z1159 Encounter for screening for other viral diseases: Secondary | ICD-10-CM | POA: Diagnosis not present

## 2019-05-19 DIAGNOSIS — R0683 Snoring: Secondary | ICD-10-CM | POA: Insufficient documentation

## 2019-05-19 DIAGNOSIS — Z79890 Hormone replacement therapy (postmenopausal): Secondary | ICD-10-CM | POA: Diagnosis not present

## 2019-05-19 DIAGNOSIS — F419 Anxiety disorder, unspecified: Secondary | ICD-10-CM | POA: Diagnosis not present

## 2019-05-19 DIAGNOSIS — Z79899 Other long term (current) drug therapy: Secondary | ICD-10-CM | POA: Insufficient documentation

## 2019-05-19 DIAGNOSIS — Z881 Allergy status to other antibiotic agents status: Secondary | ICD-10-CM | POA: Insufficient documentation

## 2019-05-19 DIAGNOSIS — I4892 Unspecified atrial flutter: Secondary | ICD-10-CM | POA: Diagnosis not present

## 2019-05-19 DIAGNOSIS — E785 Hyperlipidemia, unspecified: Secondary | ICD-10-CM | POA: Insufficient documentation

## 2019-05-19 DIAGNOSIS — I4891 Unspecified atrial fibrillation: Secondary | ICD-10-CM | POA: Diagnosis not present

## 2019-05-19 DIAGNOSIS — I4819 Other persistent atrial fibrillation: Secondary | ICD-10-CM | POA: Insufficient documentation

## 2019-05-19 DIAGNOSIS — K219 Gastro-esophageal reflux disease without esophagitis: Secondary | ICD-10-CM | POA: Diagnosis not present

## 2019-05-19 DIAGNOSIS — Z8673 Personal history of transient ischemic attack (TIA), and cerebral infarction without residual deficits: Secondary | ICD-10-CM | POA: Diagnosis not present

## 2019-05-19 DIAGNOSIS — Z880 Allergy status to penicillin: Secondary | ICD-10-CM | POA: Insufficient documentation

## 2019-05-19 DIAGNOSIS — I483 Typical atrial flutter: Secondary | ICD-10-CM | POA: Insufficient documentation

## 2019-05-19 DIAGNOSIS — Z882 Allergy status to sulfonamides status: Secondary | ICD-10-CM | POA: Insufficient documentation

## 2019-05-19 DIAGNOSIS — Z885 Allergy status to narcotic agent status: Secondary | ICD-10-CM | POA: Diagnosis not present

## 2019-05-19 DIAGNOSIS — Z8249 Family history of ischemic heart disease and other diseases of the circulatory system: Secondary | ICD-10-CM | POA: Diagnosis not present

## 2019-05-19 DIAGNOSIS — I5032 Chronic diastolic (congestive) heart failure: Secondary | ICD-10-CM | POA: Insufficient documentation

## 2019-05-19 DIAGNOSIS — E039 Hypothyroidism, unspecified: Secondary | ICD-10-CM | POA: Insufficient documentation

## 2019-05-19 DIAGNOSIS — I480 Paroxysmal atrial fibrillation: Secondary | ICD-10-CM | POA: Diagnosis not present

## 2019-05-19 HISTORY — PX: ATRIAL FIBRILLATION ABLATION: EP1191

## 2019-05-19 SURGERY — ATRIAL FIBRILLATION ABLATION
Anesthesia: General

## 2019-05-19 MED ORDER — HEPARIN (PORCINE) IN NACL 1000-0.9 UT/500ML-% IV SOLN
INTRAVENOUS | Status: AC
  Filled 2019-05-19: qty 500

## 2019-05-19 MED ORDER — HEPARIN (PORCINE) IN NACL 2000-0.9 UNIT/L-% IV SOLN
INTRAVENOUS | Status: AC
  Filled 2019-05-19: qty 1000

## 2019-05-19 MED ORDER — LIDOCAINE 2% (20 MG/ML) 5 ML SYRINGE
INTRAMUSCULAR | Status: DC | PRN
  Administered 2019-05-19: 40 mg via INTRAVENOUS

## 2019-05-19 MED ORDER — HEPARIN SODIUM (PORCINE) 1000 UNIT/ML IJ SOLN
INTRAMUSCULAR | Status: AC
  Filled 2019-05-19: qty 1

## 2019-05-19 MED ORDER — HEPARIN (PORCINE) IN NACL 2-0.9 UNITS/ML
INTRAMUSCULAR | Status: AC | PRN
  Administered 2019-05-19: 500 mL

## 2019-05-19 MED ORDER — HEPARIN SODIUM (PORCINE) 1000 UNIT/ML IJ SOLN
INTRAMUSCULAR | Status: DC | PRN
  Administered 2019-05-19: 2000 [IU] via INTRAVENOUS

## 2019-05-19 MED ORDER — ISOPROTERENOL HCL 0.2 MG/ML IJ SOLN
INTRAVENOUS | Status: DC | PRN
  Administered 2019-05-19: 20 ug/min via INTRAVENOUS

## 2019-05-19 MED ORDER — SODIUM CHLORIDE 0.9% FLUSH: 3.0000 mL | INTRAVENOUS | Status: DC | PRN

## 2019-05-19 MED ORDER — HEPARIN (PORCINE) IN NACL 1000-0.9 UT/500ML-% IV SOLN
INTRAVENOUS | Status: AC
  Filled 2019-05-19: qty 1000

## 2019-05-19 MED ORDER — SODIUM CHLORIDE 0.9 % IV SOLN
INTRAVENOUS | Status: DC
  Administered 2019-05-19: 09:00:00 via INTRAVENOUS

## 2019-05-19 MED ORDER — FENTANYL CITRATE (PF) 100 MCG/2ML IJ SOLN
INTRAMUSCULAR | Status: DC | PRN
  Administered 2019-05-19 (×2): 50 ug via INTRAVENOUS

## 2019-05-19 MED ORDER — ROCURONIUM BROMIDE 50 MG/5ML IV SOSY
PREFILLED_SYRINGE | INTRAVENOUS | Status: DC | PRN
  Administered 2019-05-19 (×2): 50 mg via INTRAVENOUS

## 2019-05-19 MED ORDER — ACETAMINOPHEN 325 MG PO TABS
650.0000 mg | ORAL_TABLET | ORAL | Status: DC | PRN
  Filled 2019-05-19: qty 2

## 2019-05-19 MED ORDER — DEXAMETHASONE SODIUM PHOSPHATE 10 MG/ML IJ SOLN
INTRAMUSCULAR | Status: DC | PRN
  Administered 2019-05-19: 5 mg via INTRAVENOUS

## 2019-05-19 MED ORDER — PANTOPRAZOLE SODIUM 40 MG PO TBEC: 40.0000 mg | DELAYED_RELEASE_TABLET | Freq: Every day | ORAL | 0 refills | Status: DC

## 2019-05-19 MED ORDER — BUPIVACAINE HCL (PF) 0.25 % IJ SOLN
INTRAMUSCULAR | Status: AC
  Filled 2019-05-19: qty 30

## 2019-05-19 MED ORDER — HEPARIN SODIUM (PORCINE) 1000 UNIT/ML IJ SOLN
INTRAMUSCULAR | Status: DC | PRN
  Administered 2019-05-19: 12000 [IU] via INTRAVENOUS
  Administered 2019-05-19: 1000 [IU] via INTRAVENOUS

## 2019-05-19 MED ORDER — PROPOFOL 10 MG/ML IV BOLUS
INTRAVENOUS | Status: DC | PRN
  Administered 2019-05-19: 100 mg via INTRAVENOUS

## 2019-05-19 MED ORDER — BUPIVACAINE HCL (PF) 0.25 % IJ SOLN
INTRAMUSCULAR | Status: DC | PRN
  Administered 2019-05-19: 30 mL

## 2019-05-19 MED ORDER — ISOPROTERENOL HCL 0.2 MG/ML IJ SOLN
INTRAMUSCULAR | Status: AC
  Filled 2019-05-19: qty 5

## 2019-05-19 MED ORDER — LIDOCAINE HCL (PF) 1 % IJ SOLN
INTRAMUSCULAR | Status: AC
  Filled 2019-05-19: qty 30

## 2019-05-19 MED ORDER — MIDAZOLAM HCL 2 MG/2ML IJ SOLN
INTRAMUSCULAR | Status: AC
  Filled 2019-05-19: qty 2

## 2019-05-19 MED ORDER — MIDAZOLAM HCL 5 MG/5ML IJ SOLN
INTRAMUSCULAR | Status: DC | PRN
  Administered 2019-05-19: 2 mg via INTRAVENOUS

## 2019-05-19 MED ORDER — PHENYLEPHRINE 40 MCG/ML (10ML) SYRINGE FOR IV PUSH (FOR BLOOD PRESSURE SUPPORT)
PREFILLED_SYRINGE | INTRAVENOUS | Status: DC | PRN
  Administered 2019-05-19 (×4): 200 ug via INTRAVENOUS

## 2019-05-19 MED ORDER — HYDROCODONE-ACETAMINOPHEN 5-325 MG PO TABS: 1.0000 | ORAL_TABLET | ORAL | Status: DC | PRN

## 2019-05-19 MED ORDER — VERAPAMIL HCL 2.5 MG/ML IV SOLN
INTRAVENOUS | Status: AC
  Filled 2019-05-19: qty 2

## 2019-05-19 MED ORDER — PROTAMINE SULFATE 10 MG/ML IV SOLN
INTRAVENOUS | Status: DC | PRN
  Administered 2019-05-19: 40 mg via INTRAVENOUS

## 2019-05-19 MED ORDER — SODIUM CHLORIDE 0.9 % IV SOLN
INTRAVENOUS | Status: DC | PRN
  Administered 2019-05-19: 11:00:00 30 ug/min via INTRAVENOUS

## 2019-05-19 MED ORDER — FENTANYL CITRATE (PF) 100 MCG/2ML IJ SOLN
INTRAMUSCULAR | Status: AC
  Filled 2019-05-19: qty 2

## 2019-05-19 MED ORDER — SODIUM CHLORIDE 0.9 % IV SOLN: 250.0000 mL | INTRAVENOUS | Status: DC | PRN

## 2019-05-19 MED ORDER — ONDANSETRON HCL 4 MG/2ML IJ SOLN: 4.0000 mg | Freq: Four times a day (QID) | INTRAMUSCULAR | Status: DC | PRN

## 2019-05-19 SURGICAL SUPPLY — 17 items
BLANKET WARM UNDERBOD FULL ACC (MISCELLANEOUS) ×3 IMPLANT
CATH MAPPNG PENTARAY F 2-6-2MM (CATHETERS) ×1 IMPLANT
CATH SMTCH THERMOCOOL SF DF (CATHETERS) ×3 IMPLANT
CATH SOUNDSTAR 3D IMAGING (CATHETERS) ×3 IMPLANT
CATH WEBSTER BI DIR CS D-F CRV (CATHETERS) ×3 IMPLANT
COVER SWIFTLINK CONNECTOR (BAG) ×6 IMPLANT
NEEDLE BAYLIS TRANSSEPTAL 71CM (NEEDLE) ×3 IMPLANT
PACK EP LATEX FREE (CUSTOM PROCEDURE TRAY) ×2
PACK EP LF (CUSTOM PROCEDURE TRAY) ×1 IMPLANT
PAD PRO RADIOLUCENT 2001M-C (PAD) ×3 IMPLANT
PATCH CARTO3 (PAD) ×3 IMPLANT
PENTARAY F 2-6-2MM (CATHETERS) ×3
SHEATH AVANTI 11F 11CM (SHEATH) ×3 IMPLANT
SHEATH PINNACLE 7F 10CM (SHEATH) ×6 IMPLANT
SHEATH PROBE COVER 6X72 (BAG) ×3 IMPLANT
SHEATH SWARTZ TS SL2 63CM 8.5F (SHEATH) ×3 IMPLANT
TUBING SMART ABLATE COOLFLOW (TUBING) ×3 IMPLANT

## 2019-05-19 NOTE — Anesthesia Preprocedure Evaluation (Signed)
Anesthesia Evaluation  Patient identified by MRN, date of birth, ID band Patient awake    Reviewed: Allergy & Precautions, NPO status , Patient's Chart, lab work & pertinent test results  History of Anesthesia Complications Negative for: history of anesthetic complications  Airway Mallampati: I  TM Distance: >3 FB Neck ROM: Full    Dental  (+) Dental Advisory Given, Caps   Pulmonary neg pulmonary ROS,  05/16/2019 SARS coronavirus NEG   breath sounds clear to auscultation       Cardiovascular hypertension, Pt. on medications and Pt. on home beta blockers (-) angina+ dysrhythmias Atrial Fibrillation  Rhythm:Irregular Rate:Normal  03/2019 ECHO: EF 50-55%, bioprosthetic AVR   Neuro/Psych Anxiety negative neurological ROS     GI/Hepatic Neg liver ROS, GERD  Controlled,  Endo/Other  Hypothyroidism   Renal/GU negative Renal ROS     Musculoskeletal  (+) Arthritis ,   Abdominal   Peds  Hematology xarelto   Anesthesia Other Findings   Reproductive/Obstetrics                             Anesthesia Physical Anesthesia Plan  ASA: III  Anesthesia Plan: General   Post-op Pain Management:    Induction: Intravenous  PONV Risk Score and Plan: 3 and Ondansetron, Dexamethasone and Treatment may vary due to age or medical condition  Airway Management Planned: Oral ETT  Additional Equipment:   Intra-op Plan:   Post-operative Plan: Extubation in OR  Informed Consent: I have reviewed the patients History and Physical, chart, labs and discussed the procedure including the risks, benefits and alternatives for the proposed anesthesia with the patient or authorized representative who has indicated his/her understanding and acceptance.     Dental advisory given  Plan Discussed with: CRNA and Surgeon  Anesthesia Plan Comments:         Anesthesia Quick Evaluation

## 2019-05-19 NOTE — Anesthesia Procedure Notes (Signed)
Procedure Name: Intubation Date/Time: 05/19/2019 10:56 AM Performed by: Lance Coon, CRNA Pre-anesthesia Checklist: Patient identified, Emergency Drugs available, Suction available, Patient being monitored and Timeout performed Patient Re-evaluated:Patient Re-evaluated prior to induction Oxygen Delivery Method: Circle system utilized Preoxygenation: Pre-oxygenation with 100% oxygen Induction Type: IV induction Ventilation: Mask ventilation without difficulty Laryngoscope Size: Miller and 2 Grade View: Grade I Tube type: Oral Tube size: 7.0 mm Number of attempts: 1 Airway Equipment and Method: Stylet Placement Confirmation: ETT inserted through vocal cords under direct vision,  positive ETCO2 and breath sounds checked- equal and bilateral Secured at: 21 cm Tube secured with: Tape Dental Injury: Teeth and Oropharynx as per pre-operative assessment

## 2019-05-19 NOTE — Transfer of Care (Signed)
Immediate Anesthesia Transfer of Care Note  Patient: Becky Winters  Procedure(s) Performed: ATRIAL FIBRILLATION ABLATION (N/A )  Patient Location: Cath Lab  Anesthesia Type:General  Level of Consciousness: drowsy and patient cooperative  Airway & Oxygen Therapy: Patient Spontanous Breathing  Post-op Assessment: Report given to RN and Post -op Vital signs reviewed and stable  Post vital signs: Reviewed and stable  Last Vitals:  Vitals Value Taken Time  BP 132/70 05/19/19 1305  Temp    Pulse 90 05/19/19 1306  Resp 9 05/19/19 1306  SpO2 97 % 05/19/19 1306  Vitals shown include unvalidated device data.  Last Pain:  Vitals:   05/19/19 0902  TempSrc:   PainSc: 0-No pain      Patients Stated Pain Goal: 2 ([id number])  Complications: No apparent anesthesia complications

## 2019-05-19 NOTE — Anesthesia Postprocedure Evaluation (Addendum)
Anesthesia Post Note  Patient: Becky Winters  Procedure(s) Performed: ATRIAL FIBRILLATION ABLATION (N/A )     Patient location during evaluation: Cath Lab Anesthesia Type: General Level of consciousness: awake and alert, patient cooperative and oriented Pain management: pain level controlled Vital Signs Assessment: post-procedure vital signs reviewed and stable Respiratory status: spontaneous breathing, nonlabored ventilation and respiratory function stable Cardiovascular status: blood pressure returned to baseline and stable Postop Assessment: no apparent nausea or vomiting Anesthetic complications: no    Last Vitals:  Vitals:   05/19/19 1425 05/19/19 1442  BP:  122/71  Pulse:  80  Resp:  18  Temp: 36.7 C   SpO2:  99%    Last Pain:  Vitals:   05/19/19 1442  TempSrc:   PainSc: 0-No pain                 Amiel Mccaffrey,E. Elin Fenley

## 2019-05-19 NOTE — Discharge Instructions (Signed)

## 2019-05-19 NOTE — Interval H&P Note (Signed)
History and Physical Interval Note:  05/19/2019 10:13 AM  Becky Winters  has presented today for surgery, with the diagnosis of afib.  The various methods of treatment have been discussed with the patient and family. After consideration of risks, benefits and other options for treatment, the patient has consented to  Procedure(s): ATRIAL FIBRILLATION ABLATION (N/A) as a surgical intervention.  The patient's history has been reviewed, patient examined, no change in status, stable for surgery.  I have reviewed the patient's chart and labs.  Questions were answered to the patient's satisfaction.     Cardiac CT reviewed with patient today.  She reports compliance with xarelto without interruption  Thompson Grayer

## 2019-05-19 NOTE — Progress Notes (Signed)
Site area: rt groin 3-way stop removed then suture cut and removed Site Prior to Removal:  Level 0 Pressure Applied For: 5 minutes Manual:   yes Patient Status During Pull:  stable Post Pull Site:  Level 0 Post Pull Instructions Given:  yes Post Pull Pulses Present: rt dp palpable Dressing Applied:  Gauze and tegaderm Bedrest begins @ [id number] Comments: IV saline locked

## 2019-05-20 ENCOUNTER — Encounter (HOSPITAL_COMMUNITY): Payer: Self-pay | Admitting: Internal Medicine

## 2019-05-20 LAB — POCT ACTIVATED CLOTTING TIME: Activated Clotting Time: 312 seconds

## 2019-05-26 NOTE — Progress Notes (Signed)
Carelink Summary Report / Loop Recorder 

## 2019-05-27 DIAGNOSIS — Z23 Encounter for immunization: Secondary | ICD-10-CM | POA: Diagnosis not present

## 2019-06-16 ENCOUNTER — Other Ambulatory Visit: Payer: Self-pay

## 2019-06-16 ENCOUNTER — Encounter (HOSPITAL_COMMUNITY): Payer: Self-pay | Admitting: Nurse Practitioner

## 2019-06-16 ENCOUNTER — Ambulatory Visit (HOSPITAL_COMMUNITY)
Admission: RE | Admit: 2019-06-16 | Discharge: 2019-06-16 | Disposition: A | Discharge status: Home / Self Care | Payer: Medicare Other | Source: Ambulatory Visit | Attending: Nurse Practitioner | Admitting: Nurse Practitioner

## 2019-06-16 VITALS — BP 134/60 | HR 54 | Ht 64.5 in | Wt 158.6 lb

## 2019-06-16 DIAGNOSIS — Z885 Allergy status to narcotic agent status: Secondary | ICD-10-CM | POA: Insufficient documentation

## 2019-06-16 DIAGNOSIS — Z79890 Hormone replacement therapy (postmenopausal): Secondary | ICD-10-CM | POA: Diagnosis not present

## 2019-06-16 DIAGNOSIS — F419 Anxiety disorder, unspecified: Secondary | ICD-10-CM | POA: Insufficient documentation

## 2019-06-16 DIAGNOSIS — E039 Hypothyroidism, unspecified: Secondary | ICD-10-CM | POA: Diagnosis not present

## 2019-06-16 DIAGNOSIS — Z8249 Family history of ischemic heart disease and other diseases of the circulatory system: Secondary | ICD-10-CM | POA: Insufficient documentation

## 2019-06-16 DIAGNOSIS — I480 Paroxysmal atrial fibrillation: Secondary | ICD-10-CM | POA: Diagnosis not present

## 2019-06-16 DIAGNOSIS — M1990 Unspecified osteoarthritis, unspecified site: Secondary | ICD-10-CM | POA: Diagnosis not present

## 2019-06-16 DIAGNOSIS — Z8673 Personal history of transient ischemic attack (TIA), and cerebral infarction without residual deficits: Secondary | ICD-10-CM | POA: Diagnosis not present

## 2019-06-16 DIAGNOSIS — E785 Hyperlipidemia, unspecified: Secondary | ICD-10-CM | POA: Insufficient documentation

## 2019-06-16 DIAGNOSIS — Z888 Allergy status to other drugs, medicaments and biological substances status: Secondary | ICD-10-CM | POA: Diagnosis not present

## 2019-06-16 DIAGNOSIS — I4892 Unspecified atrial flutter: Secondary | ICD-10-CM | POA: Insufficient documentation

## 2019-06-16 DIAGNOSIS — Z882 Allergy status to sulfonamides status: Secondary | ICD-10-CM | POA: Insufficient documentation

## 2019-06-16 DIAGNOSIS — Z7901 Long term (current) use of anticoagulants: Secondary | ICD-10-CM | POA: Diagnosis not present

## 2019-06-16 DIAGNOSIS — Z87440 Personal history of urinary (tract) infections: Secondary | ICD-10-CM | POA: Insufficient documentation

## 2019-06-16 DIAGNOSIS — K219 Gastro-esophageal reflux disease without esophagitis: Secondary | ICD-10-CM | POA: Insufficient documentation

## 2019-06-16 DIAGNOSIS — Z953 Presence of xenogenic heart valve: Secondary | ICD-10-CM | POA: Insufficient documentation

## 2019-06-16 NOTE — Progress Notes (Signed)
Primary Care Physician: Becky Bowen, MD Referring Physician: Dr. Rolanda Lundborg is a 78 y.o. female with a h/o paroxysmal afib and aflutter with a Linq in place in the afib clinic f/u ablation.  She reports that she has not appreciated any significant afib. Has  felt a few flutters that were short lived. Husband has stated since the ablation that her snoring is reduced and she does not appear to have apnea.  Pt wants to cancel sleep study, she states that she cannot even tolerate nasal  prongs for O2. No swallowing or groin issues. No bleeding issues with xarleto.  Today, she denies symptoms of palpitations, chest pain, + chronic shortness of breath,- orthopnea, PND, lower extremity edema, dizziness, presyncope, syncope, or neurologic sequela. The patient is tolerating medications without difficulties and is otherwise without complaint today.   Past Medical History:  Diagnosis Date  . Anxiety    conditional, taking in preparation for surgery   . 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 Bicuspid aortic valve 05/10/2014  . Chronic diastolic congestive heart failure ([place])   . Collagenous colitis   . Compression, esophagus 2013   stretched in the past   . Encounter for therapeutic drug monitoring 06/15/2014  . Exertional dyspnea 05/10/2014  . Foot swelling    - LEFT-ever since she had an injury as a child   . GERD (gastroesophageal reflux disease)    prevacid on occasion  . H/O hiatal hernia   . HLD (hyperlipidemia)   . Hx of echocardiogram    post AVR >> Echo (12/15):  Mild LVH, EF 55%, AVR ok (Mean gradient (S): 10 mm Hg), mild MR, mild LAE  . Hypothyroidism   . Orthostatic hypotension 05/10/2014  . Postoperative atrial fibrillation ([place]) 06/09/2014  . 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/07/2014   23 mm [facility] Ease bovine pericardial tissue valve  . Severe aortic stenosis 05/21/2014   s/p AVR  05/2014  . TIA (transient ischemic attack)   . UTI (urinary tract infection)    frequent UTI-----pt. reports that she takes Cipro if needed, last UTI- July 2015   Past Surgical History:  Procedure Laterality Date  . AORTIC VALVE REPLACEMENT N/A 06/07/2014   Procedure: AORTIC VALVE REPLACEMENT  (AVR) with 72mm Aortic Perimount Magna Ease;  Surgeon: Rexene Alberts, MD;  Location: [place];  Service: Open Heart Surgery;  Laterality: N/A;  . APPENDECTOMY    . ATRIAL FIBRILLATION ABLATION N/A 05/19/2019   Procedure: ATRIAL FIBRILLATION ABLATION;  Surgeon: Thompson Grayer, MD;  Location: [place] [facility];  Service: Cardiovascular;  Laterality: N/A;  . BLADDER SURGERY     x2 for tacking  . CARDIAC CATHETERIZATION    . cardiolite  2003, 2006  . CHOLECYSTECTOMY    . ESOPHAGEAL DILATION    . INTRAOPERATIVE TRANSESOPHAGEAL ECHOCARDIOGRAM N/A 06/07/2014   Procedure: INTRAOPERATIVE TRANSESOPHAGEAL ECHOCARDIOGRAM;  Surgeon: Rexene Alberts, MD;  Location: [place];  Service: Open Heart Surgery;  Laterality: N/A;  . LEFT AND RIGHT HEART CATHETERIZATION WITH CORONARY ANGIOGRAM N/A 05/25/2014   Procedure: LEFT AND RIGHT HEART CATHETERIZATION WITH CORONARY ANGIOGRAM;  Surgeon: Larey Dresser, MD;  Location: [facility] CATH LAB;  Service: Cardiovascular;  Laterality: N/A;  . LOOP RECORDER INSERTION N/A 01/07/2018   Procedure: LOOP RECORDER INSERTION;  Surgeon: Thompson Grayer, MD;  Location: [place] CV LAB;  Service: Cardiovascular;  Laterality: N/A;  .  NASAL SEPTUM SURGERY  1995  . THUMB ARTHROSCOPY Left 2006   ? replacement of ligament   . TONSILLECTOMY    . TOTAL ABDOMINAL HYSTERECTOMY     PARTIAL    Current Outpatient Medications  Medication Sig Dispense Refill  . ALPRAZolam (XANAX) 0.25 MG tablet Take 1 tablet (0.25 mg total) by mouth daily as needed for sleep. (Patient taking differently: Take 0.125 mg by mouth at bedtime as needed for sleep. Takes 1/2 tablet prn) 30 tablet 0  . atorvastatin (LIPITOR) 40 MG  tablet Take 20 mg by mouth at bedtime.     Marland Kitchen azithromycin (ZITHROMAX) 250 MG tablet Take (2) tablets by mouth 30 minutes before dental procedure 6 each 1  . b complex vitamins tablet Take 1 tablet by mouth at bedtime.     Marland Kitchen BETA CAROTENE PO Take 1 tablet by mouth daily.    . beta carotene w/minerals (OCUVITE) tablet Take 1 tablet by mouth 2 (two) times daily.     . Biotin 10000 MCG TABS Take 10,000 mcg by mouth 2 (two) times daily.    . Cholecalciferol (VITAMIN D) 50 MCG (2000 UT) CAPS Take 2,000 Units by mouth 2 (two) times daily.    . Cinnamon 500 MG TABS Take 500 mg by mouth 2 (two) times daily.     . Coenzyme Q10 (COQ-10) 100 MG CAPS Take 100 mg by mouth 2 (two) times daily.     . Doxylamine Succinate, Sleep, (SLEEP AID PO) Take by mouth. Taking one tablet at night for sleep    . folic acid (FOLVITE) Q000111Q MCG tablet Take 800 mcg by mouth daily.     Marland Kitchen levothyroxine (SYNTHROID, LEVOTHROID) 100 MCG tablet Take 100 mcg by mouth daily before breakfast.     . Melatonin 3 MG TABS Take 3 mg by mouth at bedtime.     . nitrofurantoin, macrocrystal-monohydrate, (MACROBID) 100 MG capsule Take 100 mg by mouth at bedtime.     Marland Kitchen OLIVE LEAF EXTRACT PO Take 500 mg by mouth 2 (two) times daily.     . Omega-3 Fatty Acids (FISH OIL PO) Take 1,400 mg by mouth 2 (two) times daily.    . pantoprazole (PROTONIX) 40 MG tablet Take 1 tablet (40 mg total) by mouth daily. 45 tablet 0  . Polyethyl Glycol-Propyl Glycol (SYSTANE OP) Place 1 drop into both eyes at bedtime as needed (dry eyes).    . Probiotic Product (ALIGN) 4 MG CAPS Take 4 mg by mouth daily as needed (colitis flare).    . RESTASIS 0.05 % ophthalmic emulsion Place 1 drop into both eyes 2 (two) times daily.    . rivaroxaban (XARELTO) 20 MG TABS tablet Take 1 tablet (20 mg total) by mouth daily with supper. (Patient taking differently: Take 20 mg by mouth daily with lunch. ) 90 tablet 1  . TURMERIC PO Take 1,000 mg by mouth every Monday, Wednesday, and Friday.      No current facility-administered medications for this encounter.     Allergies  Allergen Reactions  . Amiodarone Other (See Comments)    Dizziness, impaired vision (felt like eyes were crossed)  . Clindamycin/Lincomycin Itching and Rash    Full body rash  . Asacol [Mesalamine] Other (See Comments)    GI Upset  Severe diarrhea   . Codeine Nausea And Vomiting  . Pentasa [Mesalamine Er] Other (See Comments)    Severe diarrhea   . Sulfa Antibiotics Rash  . Amoxicillin Rash    Did it  involve swelling of the face/tongue/throat, SOB, or low BP? No Did it involve sudden or severe rash/hives, skin peeling, or any reaction on the inside of your mouth or nose? No Did you need to seek medical attention at a hospital or doctor's office? No When did it last happen?10 + years If all above answers are "NO", may proceed with cephalosporin use.     Social History   Socioeconomic History  . Marital status: Married    Spouse name: Not on file  . Number of children: Not on file  . Years of education: Not on file  . Highest education level: Not on file  Occupational History  . Not on file  Social Needs  . Financial resource strain: Not on file  . Food insecurity    Worry: Not on file    Inability: Not on file  . Transportation needs    Medical: Not on file    Non-medical: Not on file  Tobacco Use  . Smoking status: Never Smoker  . Smokeless tobacco: Never Used  Substance and Sexual Activity  . Alcohol use: No  . Drug use: No  . Sexual activity: Not on file  Lifestyle  . Physical activity    Days per week: Not on file    Minutes per session: Not on file  . Stress: Not on file  Relationships  . Social Herbalist on phone: Not on file    Gets together: Not on file    Attends religious service: Not on file    Active member of club or organization: Not on file    Attends meetings of clubs or organizations: Not on file    Relationship status: Not on file  .  Intimate partner violence    Fear of current or ex partner: Not on file    Emotionally abused: Not on file    Physically abused: Not on file    Forced sexual activity: Not on file  Other Topics Concern  . Not on file  Social History Narrative  . Not on file    Family History  Problem Relation Age of Onset  . CAD Father 42  . Heart attack Father   . Heart attack Paternal Grandfather   . Stroke Paternal Grandmother   . CVA Other   . Hypertension Neg Hx     ROS- All systems are reviewed and negative except as per the HPI above  Physical Exam: Vitals:   06/16/19 0951  BP: 134/60  Pulse: (!) 54  Weight: 71.9 kg  Height: 5' 4.5" (1.638 m)   Wt Readings from Last 3 Encounters:  06/16/19 71.9 kg  05/19/19 71.7 kg  04/21/19 70.8 kg    Labs: Lab Results  Component Value Date   NA 141 05/16/2019   K 4.4 05/16/2019   CL 104 05/16/2019   CO2 21 05/16/2019   GLUCOSE 95 05/16/2019   BUN 15 05/16/2019   CREATININE 0.95 05/16/2019   CALCIUM 9.4 05/16/2019   MG 2.0 06/08/2014   Lab Results  Component Value Date   INR 2.6 04/21/2019   Lab Results  Component Value Date   CHOL 117 06/15/2017   HDL 41 06/15/2017   LDLCALC 54 06/15/2017   TRIG 112 06/15/2017     GEN- The patient is well appearing, alert and oriented x 3 today.   Head- normocephalic, atraumatic Eyes-  Sclera clear, conjunctiva pink Ears- hearing intact Oropharynx- clear Neck- supple, no JVP Lymph- no cervical lymphadenopathy  Lungs- Clear to ausculation bilaterally, normal work of breathing Heart- regular rate and rhythm, no murmurs, rubs or gallops, PMI not laterally displaced GI- soft, NT, ND, + BS Extremities- no clubbing, cyanosis, or edema MS- no significant deformity or atrophy Skin- no rash or lesion Psych- euthymic mood, full affect Neuro- strength and sensation are intact  EKG-Sinus brady at 54 bpm, Pr int 144 ms, qrs int 84 ms, qtc 441 ms  Echo-1. The left ventricle has low normal  systolic function, with an ejection fraction of 50-55%. The cavity size was mildly dilated. Left ventricular diastolic Doppler parameters are indeterminate.  2. Abnormal septal motion.  3. The right ventricle has normal systolic function. The cavity was normal. There is no increase in right ventricular wall thickness.  4. Left atrial size was mildly dilated.  5. Right atrial size was mildly dilated.  6. Mild thickening of the mitral valve leaflet.  7. 23 Perimount AVR bioprosthetic Gradients a bit high but stable since echo done 03/19/17 No PVL.  8. The aorta is abnormal unless otherwise noted.  9. There is mild dilatation of the aortic root measuring 38 mm.  Assessment and Plan: 1. Paroxysmal afib/ persistent atrial flutter with CVR  Used amiodarone in the past, did not want to revisit Now s/p ablation x one month Enjoying  SR  CHA2DS2VASc score of 5, continue xarelto,she did express some desire to go back to coumadin after the 3 month s/p ablation appointment  Sleep study cancelled per pt request   F/u ith Dr. Rayann Heman 12/28  Geroge Baseman. Yolando Gillum, [facility] [address] [place],  [address] [phone]

## 2019-06-20 ENCOUNTER — Ambulatory Visit (INDEPENDENT_AMBULATORY_CARE_PROVIDER_SITE_OTHER): Payer: Medicare Other | Admitting: *Deleted

## 2019-06-20 DIAGNOSIS — I480 Paroxysmal atrial fibrillation: Secondary | ICD-10-CM | POA: Diagnosis not present

## 2019-06-20 DIAGNOSIS — I5032 Chronic diastolic (congestive) heart failure: Secondary | ICD-10-CM

## 2019-06-21 LAB — CUP PACEART REMOTE DEVICE CHECK
Date Time Interrogation Session: 20201025201208
Implantable Pulse Generator Implant Date: 20190516

## 2019-06-24 ENCOUNTER — Encounter (HOSPITAL_BASED_OUTPATIENT_CLINIC_OR_DEPARTMENT_OTHER): Payer: Medicare Other | Admitting: Cardiology

## 2019-07-08 NOTE — Progress Notes (Signed)
Carelink Summary Report / Loop Recorder 

## 2019-07-24 LAB — CUP PACEART REMOTE DEVICE CHECK
Date Time Interrogation Session: 20201127150907
Implantable Pulse Generator Implant Date: 20190516

## 2019-07-25 ENCOUNTER — Ambulatory Visit (INDEPENDENT_AMBULATORY_CARE_PROVIDER_SITE_OTHER): Payer: Medicare Other | Admitting: *Deleted

## 2019-07-25 DIAGNOSIS — I480 Paroxysmal atrial fibrillation: Secondary | ICD-10-CM

## 2019-07-29 ENCOUNTER — Other Ambulatory Visit (HOSPITAL_COMMUNITY): Payer: Self-pay | Admitting: *Deleted

## 2019-07-29 MED ORDER — APIXABAN 5 MG PO TABS: 5.0000 mg | ORAL_TABLET | Freq: Two times a day (BID) | ORAL | 3 refills | Status: DC

## 2019-08-17 NOTE — Progress Notes (Signed)
ILR remote 

## 2019-08-22 ENCOUNTER — Other Ambulatory Visit: Payer: Self-pay

## 2019-08-22 ENCOUNTER — Encounter: Payer: Self-pay | Admitting: Internal Medicine

## 2019-08-22 ENCOUNTER — Telehealth (INDEPENDENT_AMBULATORY_CARE_PROVIDER_SITE_OTHER): Payer: Medicare Other | Admitting: Internal Medicine

## 2019-08-22 VITALS — BP 119/68 | HR 67 | Ht 64.5 in | Wt 157.0 lb

## 2019-08-22 DIAGNOSIS — I2510 Atherosclerotic heart disease of native coronary artery without angina pectoris: Secondary | ICD-10-CM | POA: Diagnosis not present

## 2019-08-22 DIAGNOSIS — Z953 Presence of xenogenic heart valve: Secondary | ICD-10-CM | POA: Diagnosis not present

## 2019-08-22 DIAGNOSIS — D6869 Other thrombophilia: Secondary | ICD-10-CM | POA: Diagnosis not present

## 2019-08-22 DIAGNOSIS — Z5181 Encounter for therapeutic drug level monitoring: Secondary | ICD-10-CM | POA: Diagnosis not present

## 2019-08-22 DIAGNOSIS — I480 Paroxysmal atrial fibrillation: Secondary | ICD-10-CM | POA: Diagnosis not present

## 2019-08-22 NOTE — Progress Notes (Signed)
Electrophysiology TeleHealth Note  Due to national recommendations of social distancing due to [place] 19, an audio telehealth visit is felt to be most appropriate for this patient at this time.  Verbal consent was obtained by me for the telehealth visit today.  The patient does not have capability for a virtual visit.  A phone visit is therefore required today.   Date:  08/22/2019   ID:  [name], DOB 1940/10/06, MRN [id number]  Location: patient's home  Provider location:  [facility]  Evaluation Performed: Follow-up visit  PCP:  Reynold Bowen, MD   Electrophysiologist:  Dr Rayann Heman  Chief Complaint:  palpitations  History of Present Illness:    EARNEST ARMFIELD is a 78 y.o. female who presents via telehealth conferencing today.  Since her afib ablation, the patient reports doing very well.  She has had some nausea post ablation, though feels this is better now.  She attributes this to both xarelto and eliquis.  Denies odynophagia.  No other procedure related complications. Today, she denies symptoms of palpitations, chest pain, shortness of breath,  lower extremity edema, dizziness, presyncope, or syncope.  The patient is otherwise without complaint today.  The patient denies symptoms of fevers, chills, cough, or new SOB worrisome for COVID 19.  Past Medical History:  Diagnosis Date  . Anxiety    conditional, taking in preparation for surgery   . 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 Bicuspid aortic valve 05/10/2014  . Chronic diastolic congestive heart failure ([place])   . Collagenous colitis   . Compression, esophagus 2013   stretched in the past   . Encounter for therapeutic drug monitoring 06/15/2014  . Exertional dyspnea 05/10/2014  . Foot swelling    - LEFT-ever since she had an injury as a child   . GERD (gastroesophageal reflux disease)    prevacid on occasion  . H/O hiatal hernia   . HLD (hyperlipidemia)   .  Hx of echocardiogram    post AVR >> Echo (12/15):  Mild LVH, EF 55%, AVR ok (Mean gradient (S): 10 mm Hg), mild MR, mild LAE  . Hypothyroidism   . Orthostatic hypotension 05/10/2014  . Postoperative atrial fibrillation ([place]) 06/09/2014  . 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/07/2014   23 mm [facility] Ease bovine pericardial tissue valve  . Severe aortic stenosis 05/21/2014   s/p AVR 05/2014  . TIA (transient ischemic attack)   . UTI (urinary tract infection)    frequent UTI-----pt. reports that she takes Cipro if needed, last UTI- July 2015    Past Surgical History:  Procedure Laterality Date  . AORTIC VALVE REPLACEMENT N/A 06/07/2014   Procedure: AORTIC VALVE REPLACEMENT  (AVR) with 34mm Aortic Perimount Magna Ease;  Surgeon: Rexene Alberts, MD;  Location: [place];  Service: Open Heart Surgery;  Laterality: N/A;  . APPENDECTOMY    . ATRIAL FIBRILLATION ABLATION N/A 05/19/2019   Procedure: ATRIAL FIBRILLATION ABLATION;  Surgeon: Thompson Grayer, MD;  Location: [place] [facility];  Service: Cardiovascular;  Laterality: N/A;  . BLADDER SURGERY     x2 for tacking  . CARDIAC CATHETERIZATION    . cardiolite  2003, 2006  . CHOLECYSTECTOMY    . ESOPHAGEAL DILATION    . INTRAOPERATIVE TRANSESOPHAGEAL ECHOCARDIOGRAM N/A 06/07/2014   Procedure: INTRAOPERATIVE TRANSESOPHAGEAL ECHOCARDIOGRAM;  Surgeon: Rexene Alberts, MD;  Location: [place];  Service: Open Heart  Surgery;  Laterality: N/A;  . LEFT AND RIGHT HEART CATHETERIZATION WITH CORONARY ANGIOGRAM N/A 05/25/2014   Procedure: LEFT AND RIGHT HEART CATHETERIZATION WITH CORONARY ANGIOGRAM;  Surgeon: Larey Dresser, MD;  Location: [facility] CATH LAB;  Service: Cardiovascular;  Laterality: N/A;  . LOOP RECORDER INSERTION N/A 01/07/2018   Procedure: LOOP RECORDER INSERTION;  Surgeon: Thompson Grayer, MD;  Location: [place] CV LAB;  Service: Cardiovascular;  Laterality: N/A;  . NASAL SEPTUM SURGERY  1995  . THUMB ARTHROSCOPY Left 2006    ? replacement of ligament   . TONSILLECTOMY    . TOTAL ABDOMINAL HYSTERECTOMY     PARTIAL    Current Outpatient Medications  Medication Sig Dispense Refill  . ALPRAZolam (XANAX) 0.25 MG tablet Take 1 tablet (0.25 mg total) by mouth daily as needed for sleep. 30 tablet 0  . apixaban (ELIQUIS) 5 MG TABS tablet Take 1 tablet (5 mg total) by mouth 2 (two) times daily. 60 tablet 3  . atorvastatin (LIPITOR) 40 MG tablet Take 20 mg by mouth at bedtime.     Marland Kitchen azithromycin (ZITHROMAX) 250 MG tablet Take (2) tablets by mouth 30 minutes before dental procedure 6 each 1  . b complex vitamins tablet Take 1 tablet by mouth at bedtime.     Marland Kitchen BETA CAROTENE PO Take 1 tablet by mouth daily.    . beta carotene w/minerals (OCUVITE) tablet Take 1 tablet by mouth 2 (two) times daily.     . Biotin 10000 MCG TABS Take 10,000 mcg by mouth 2 (two) times daily.    . Cholecalciferol (VITAMIN D) 50 MCG (2000 UT) CAPS Take 2,000 Units by mouth 2 (two) times daily.    . Cinnamon 500 MG TABS Take 500 mg by mouth 2 (two) times daily.     . Coenzyme Q10 (COQ-10) 100 MG CAPS Take 100 mg by mouth 2 (two) times daily.     . Doxylamine Succinate, Sleep, (SLEEP AID PO) Take by mouth. Taking one tablet at night for sleep    . folic acid (FOLVITE) Q000111Q MCG tablet Take 800 mcg by mouth daily.     Marland Kitchen levothyroxine (SYNTHROID, LEVOTHROID) 100 MCG tablet Take 100 mcg by mouth daily before breakfast.     . Melatonin 3 MG TABS Take 3 mg by mouth at bedtime.     . nitrofurantoin, macrocrystal-monohydrate, (MACROBID) 100 MG capsule Take 100 mg by mouth at bedtime.     Marland Kitchen OLIVE LEAF EXTRACT PO Take 500 mg by mouth 2 (two) times daily.     . Omega-3 Fatty Acids (FISH OIL PO) Take 1,400 mg by mouth 2 (two) times daily.    Vladimir Faster Glycol-Propyl Glycol (SYSTANE OP) Place 1 drop into both eyes at bedtime as needed (dry eyes).    . Probiotic Product (ALIGN) 4 MG CAPS Take 4 mg by mouth daily as needed (colitis flare).    . RESTASIS 0.05 %  ophthalmic emulsion Place 1 drop into both eyes 2 (two) times daily.    . TURMERIC PO Take 1,000 mg by mouth every Monday, Wednesday, and Friday.    . pantoprazole (PROTONIX) 40 MG tablet Take 1 tablet (40 mg total) by mouth daily. 45 tablet 0   No current facility-administered medications for this visit.    Allergies:   Amiodarone, Clindamycin/lincomycin, Asacol [mesalamine], Codeine, Pentasa [mesalamine er], Sulfa antibiotics, and Amoxicillin   Social History:  The patient  reports that she has never smoked. She has never used smokeless tobacco. She reports  that she does not drink alcohol or use drugs.   Family History:  The patient's  family history includes CAD (age of onset: 31) in her father; CVA in an other family member; Heart attack in her father and paternal grandfather; Stroke in her paternal grandmother.   ROS:  Please see the history of present illness.   All other systems are personally reviewed and negative.    Exam:    Vital Signs:  BP 119/68   Pulse 67   Ht 5' 4.5" (1.638 m)   Wt 157 lb (71.2 kg)   BMI 26.53 kg/m   Well sounding, alert and conversant   Labs/Other Tests and Data Reviewed:    Recent Labs: 05/16/2019: BUN 15; Creatinine, Ser 0.95; Hemoglobin 14.5; Platelets 209; Potassium 4.4; Sodium 141   Wt Readings from Last 3 Encounters:  08/22/19 157 lb (71.2 kg)  06/16/19 158 lb 9.6 oz (71.9 kg)  05/19/19 158 lb (71.7 kg)     Last device remote is reviewed from [place] PDF which reveals normal device function, afib has improved   ASSESSMENT & PLAN:    1.  Paroxysmal atrial fibrillation/ atrial flutter Well controlled post ablation off AAD therapy Continue to follow her ILR chads2vasc score is 5.  She is now on eliquis.  She feels that her recent nausea is due to xarelto and also eliquis. She may need to return to coumadin but would prefer to continue eliquis for now.  2.  Snoring She had a sleep study arranged by the AF clinic but did not comply  with this recommendation.  3. S/p AVR (bioprosthetic) Followed by Dr Marlou Porch  Follow-up:  3 months with me Follow-up with Dr Marlou Porch as scheduled  Patient Risk:  after full review of this patients clinical status, I feel that they are at moderate risk at this time.  Today, I have spent 15 minutes with the patient with telehealth technology discussing arrhythmia management .    Army Fossa, MD  08/22/2019 3:01 PM     [place] [address] [phone] (office) [phone] (fax)

## 2019-08-25 ENCOUNTER — Ambulatory Visit (INDEPENDENT_AMBULATORY_CARE_PROVIDER_SITE_OTHER): Payer: Medicare Other | Admitting: *Deleted

## 2019-08-25 DIAGNOSIS — R002 Palpitations: Secondary | ICD-10-CM | POA: Diagnosis not present

## 2019-08-25 LAB — CUP PACEART REMOTE DEVICE CHECK
Date Time Interrogation Session: 20201230230721
Implantable Pulse Generator Implant Date: 20190516

## 2019-08-26 NOTE — Progress Notes (Signed)
ILR remote 

## 2019-09-03 ENCOUNTER — Ambulatory Visit: Payer: Medicare Other | Attending: Internal Medicine

## 2019-09-03 DIAGNOSIS — Z23 Encounter for immunization: Secondary | ICD-10-CM | POA: Insufficient documentation

## 2019-09-03 NOTE — Progress Notes (Signed)
   Covid-19 Vaccination Clinic  Name:  TYANNE SESSUM    MRN: [id number] DOB: [birth year]  09/03/2019  Ms. Bena was observed post Covid-19 immunization for 30 minutes based on pre-vaccination screening without incidence. She was provided with Vaccine Information Sheet and instruction to access the V-Safe system.   Ms. Liaw was instructed to call 911 with any severe reactions post vaccine: Marland Kitchen Difficulty breathing  . Swelling of your face and throat  . A fast heartbeat  . A bad rash all over your body  . Dizziness and weakness    Immunizations Administered    Name Date Dose VIS Date Route   Pfizer COVID-19 Vaccine 09/03/2019  2:31 PM 0.3 mL 08/05/2019 Intramuscular   Manufacturer: [place]   Lot: [id number]   [place]: [id number]

## 2019-09-08 DIAGNOSIS — K222 Esophageal obstruction: Secondary | ICD-10-CM | POA: Diagnosis not present

## 2019-09-08 DIAGNOSIS — I779 Disorder of arteries and arterioles, unspecified: Secondary | ICD-10-CM | POA: Diagnosis not present

## 2019-09-08 DIAGNOSIS — M5416 Radiculopathy, lumbar region: Secondary | ICD-10-CM | POA: Diagnosis not present

## 2019-09-08 DIAGNOSIS — E039 Hypothyroidism, unspecified: Secondary | ICD-10-CM | POA: Diagnosis not present

## 2019-09-08 DIAGNOSIS — H547 Unspecified visual loss: Secondary | ICD-10-CM | POA: Diagnosis not present

## 2019-09-08 DIAGNOSIS — Z952 Presence of prosthetic heart valve: Secondary | ICD-10-CM | POA: Diagnosis not present

## 2019-09-08 DIAGNOSIS — E785 Hyperlipidemia, unspecified: Secondary | ICD-10-CM | POA: Diagnosis not present

## 2019-09-08 DIAGNOSIS — I2510 Atherosclerotic heart disease of native coronary artery without angina pectoris: Secondary | ICD-10-CM | POA: Diagnosis not present

## 2019-09-08 DIAGNOSIS — I679 Cerebrovascular disease, unspecified: Secondary | ICD-10-CM | POA: Diagnosis not present

## 2019-09-08 DIAGNOSIS — R42 Dizziness and giddiness: Secondary | ICD-10-CM | POA: Diagnosis not present

## 2019-09-08 DIAGNOSIS — I480 Paroxysmal atrial fibrillation: Secondary | ICD-10-CM | POA: Diagnosis not present

## 2019-09-09 DIAGNOSIS — E038 Other specified hypothyroidism: Secondary | ICD-10-CM | POA: Diagnosis not present

## 2019-09-09 DIAGNOSIS — E559 Vitamin D deficiency, unspecified: Secondary | ICD-10-CM | POA: Diagnosis not present

## 2019-09-09 DIAGNOSIS — R202 Paresthesia of skin: Secondary | ICD-10-CM | POA: Diagnosis not present

## 2019-09-09 DIAGNOSIS — E7849 Other hyperlipidemia: Secondary | ICD-10-CM | POA: Diagnosis not present

## 2019-09-09 DIAGNOSIS — I480 Paroxysmal atrial fibrillation: Secondary | ICD-10-CM | POA: Diagnosis not present

## 2019-09-24 ENCOUNTER — Ambulatory Visit: Payer: Medicare Other | Attending: Internal Medicine

## 2019-09-24 DIAGNOSIS — Z23 Encounter for immunization: Secondary | ICD-10-CM | POA: Insufficient documentation

## 2019-09-24 NOTE — Progress Notes (Signed)
   Covid-19 Vaccination Clinic  Name:  REIGN CHO    MRN: [id number] DOB: 05-13-1941  09/24/2019  Ms. Copes was observed post Covid-19 immunization for 15 minutes without incidence. She was provided with Vaccine Information Sheet and instruction to access the V-Safe system.   Ms. Burdon was instructed to call 911 with any severe reactions post vaccine: Marland Kitchen Difficulty breathing  . Swelling of your face and throat  . A fast heartbeat  . A bad rash all over your body  . Dizziness and weakness    Immunizations Administered    Name Date Dose VIS Date Route   Pfizer COVID-19 Vaccine 09/24/2019  9:54 AM 0.3 mL 08/05/2019 Intramuscular   Manufacturer: [place]   Lot: [id number]   [place]: [id number]

## 2019-09-26 ENCOUNTER — Ambulatory Visit (INDEPENDENT_AMBULATORY_CARE_PROVIDER_SITE_OTHER): Payer: Medicare Other | Admitting: *Deleted

## 2019-09-26 DIAGNOSIS — R002 Palpitations: Secondary | ICD-10-CM

## 2019-09-26 LAB — CUP PACEART REMOTE DEVICE CHECK
Date Time Interrogation Session: 20210201001541
Implantable Pulse Generator Implant Date: 20190516

## 2019-09-26 NOTE — Progress Notes (Signed)
ILR Remote 

## 2019-09-28 DIAGNOSIS — H547 Unspecified visual loss: Secondary | ICD-10-CM | POA: Diagnosis not present

## 2019-09-28 DIAGNOSIS — E039 Hypothyroidism, unspecified: Secondary | ICD-10-CM | POA: Diagnosis not present

## 2019-09-28 DIAGNOSIS — I480 Paroxysmal atrial fibrillation: Secondary | ICD-10-CM | POA: Diagnosis not present

## 2019-09-28 DIAGNOSIS — Z952 Presence of prosthetic heart valve: Secondary | ICD-10-CM | POA: Diagnosis not present

## 2019-09-28 DIAGNOSIS — I679 Cerebrovascular disease, unspecified: Secondary | ICD-10-CM | POA: Diagnosis not present

## 2019-09-28 DIAGNOSIS — R413 Other amnesia: Secondary | ICD-10-CM | POA: Diagnosis not present

## 2019-09-28 DIAGNOSIS — E559 Vitamin D deficiency, unspecified: Secondary | ICD-10-CM | POA: Diagnosis not present

## 2019-10-03 ENCOUNTER — Other Ambulatory Visit: Payer: Self-pay | Admitting: Endocrinology

## 2019-10-03 DIAGNOSIS — R42 Dizziness and giddiness: Secondary | ICD-10-CM

## 2019-10-03 DIAGNOSIS — H547 Unspecified visual loss: Secondary | ICD-10-CM

## 2019-10-04 ENCOUNTER — Ambulatory Visit
Admission: RE | Admit: 2019-10-04 | Discharge: 2019-10-04 | Disposition: A | Discharge status: Home / Self Care | Payer: Medicare Other | Source: Ambulatory Visit | Attending: Endocrinology | Admitting: Endocrinology

## 2019-10-04 DIAGNOSIS — R42 Dizziness and giddiness: Secondary | ICD-10-CM | POA: Diagnosis not present

## 2019-10-04 DIAGNOSIS — H547 Unspecified visual loss: Secondary | ICD-10-CM

## 2019-10-04 MED ORDER — IOPAMIDOL (ISOVUE-300) INJECTION 61%
75.0000 mL | Freq: Once | INTRAVENOUS | Status: AC | PRN
  Administered 2019-10-04: 11:00:00 75 mL via INTRAVENOUS

## 2019-10-10 ENCOUNTER — Other Ambulatory Visit: Payer: Medicare Other

## 2019-10-11 ENCOUNTER — Encounter: Payer: Self-pay | Admitting: Neurology

## 2019-10-11 ENCOUNTER — Other Ambulatory Visit: Payer: Self-pay

## 2019-10-11 ENCOUNTER — Ambulatory Visit: Payer: Medicare Other | Admitting: Neurology

## 2019-10-11 VITALS — BP 122/80 | HR 66 | Temp 97.6°F | Ht 64.5 in | Wt 152.5 lb

## 2019-10-11 DIAGNOSIS — R413 Other amnesia: Secondary | ICD-10-CM

## 2019-10-11 DIAGNOSIS — F418 Other specified anxiety disorders: Secondary | ICD-10-CM | POA: Insufficient documentation

## 2019-10-11 DIAGNOSIS — H539 Unspecified visual disturbance: Secondary | ICD-10-CM

## 2019-10-11 MED ORDER — ESCITALOPRAM OXALATE 5 MG PO TABS: 5.0000 mg | ORAL_TABLET | Freq: Every day | ORAL | 5 refills | Status: DC

## 2019-10-11 NOTE — Progress Notes (Signed)
[facility]  PATIENT: Becky Winters DOB: 30-May-1941  REFERRING DOCTOR OR PCP: Reynold Bowen MD SOURCE: Patient, Husband, notes from Dr. Forde Dandy, imaging and lab reports, head CT images personally reviewed  _________________________________   HISTORICAL  CHIEF COMPLAINT:  Chief Complaint  Patient presents with  . New Patient (Initial Visit)    RM 85 with husband (temp: 97.4). Paper referral from Dr. Forde Dandy (PCP) for memory loss.  Feels sx started around 09/21/19. She does get dizzy intermittently. Did MOCA: 20/30.     HISTORY OF PRESENT ILLNESS:  I had the pleasure seeing your patient, Becky Winters, at [facility] for neurologic consultation regarding her recent memory loss and episodes of visual disturbance.  She is a 79 year old who has had issues with her memory for the past month.    She notes a lot of stress in January and was feeling more anxious.  A couple days after the first COVID-19 vaccination [employer]), she began to have difficulties with memory.   After the second vaccination the last week of January, memory difficulties worsened further.  She has had difficulties with whiting out of the left vision that last for a few minutes.  The episode in January 25th was more severe than other episodes and occurred after her second vaccination.    Besides issues with short-term memory, she also notes reduced visual processing.  Her husband also notes that she was much more anxious and seemed to have trouble with reality and time.  Both her and her husband recognized her cognitive issues at about the same time.  She has not driven since the issues began but rarely drives due to anxiety.      Compared to the end of January, her husband feels she is about 75% better.  However, she is not back to baseline.  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  She has probably had about 4 episodes since the more severe episode a few weeks ago.  Her husband reports that the last eye exam in 2020 showed dry eyes but no other issues.         She has not started any recent medications.   She is sleeping poorly the last month, associated with her increase in anxiety.   She denies major depression recently but feels a little down with Covid-19.    .     I personally reviewed the CT scan of the head 10/04/2019.  It is normal for age with minimal generalized cortical atrophy.   ESR was normal.  Carotid dopplers in 2015 showed 1-39% stenosis bilaterally.  In the past she has had an AVR (06/07/2014) and also has had a recentcardiac ablation   REVIEW OF SYSTEMS: Constitutional: No fevers, chills, sweats, or change in appetite Eyes: No visual changes, double vision, eye pain is dialyzed.  See above. Ear, nose and throat: No hearing loss, ear pain, nasal congestion, sore throat Cardiovascular: as above Respiratory: No shortness of breath at rest or with exertion.   No wheezes GastrointestinaI: No nausea, vomiting, diarrhea, abdominal pain, fecal incontinence Genitourinary: No dysuria, urinary retention or frequency.  No nocturia. Musculoskeletal: No neck pain, back pain Integumentary: No rash, pruritus, skin lesions Neurological: as above Psychiatric: Anxiety greater than depression Endocrine: No palpitations, diaphoresis, change in appetite, change in weigh or increased thirst Hematologic/Lymphatic: No anemia, purpura, petechiae. Allergic/Immunologic:  dry eyes, nasal congestion, recent allergic reactions, rashes  ALLERGIES: Allergies  Allergen Reactions  . Amiodarone Other (See Comments)    Dizziness, impaired vision (felt like eyes were crossed)  . Clindamycin/Lincomycin Itching and Rash    Full body rash  . Asacol [Mesalamine] Other (See Comments)    GI Upset  Severe diarrhea   . Codeine Nausea And Vomiting  . Pentasa [Mesalamine  Er] Other (See Comments)    Severe diarrhea   . Sulfa Antibiotics Rash  . Amoxicillin Rash    Did it involve swelling of the face/tongue/throat, SOB, or low BP? No Did it involve sudden or severe rash/hives, skin peeling, or any reaction on the inside of your mouth or nose? No Did you need to seek medical attention at a hospital or doctor's office? No When did it last happen?10 + years If all above answers are "NO", may proceed with cephalosporin use.     HOME MEDICATIONS:  Current Outpatient Medications:  .  ALPRAZolam (XANAX) 0.25 MG tablet, Take 1 tablet (0.25 mg total) by mouth daily as needed for sleep., Disp: 30 tablet, Rfl: 0 .  apixaban (ELIQUIS) 5 MG TABS tablet, Take 1 tablet (5 mg total) by mouth 2 (two) times daily., Disp: 60 tablet, Rfl: 3 .  atorvastatin (LIPITOR) 40 MG tablet, Take 20 mg by mouth at bedtime. , Disp: , Rfl:  .  azithromycin (ZITHROMAX) 250 MG tablet, Take (2) tablets by mouth 30 minutes before dental procedure (Patient not taking: Reported on 10/11/2019), Disp: 6 each, Rfl: 1 .  beta carotene w/minerals (OCUVITE) tablet, Take 1 tablet by mouth 2 (two) times daily. , Disp: , Rfl:  .  levothyroxine (SYNTHROID, LEVOTHROID) 100 MCG tablet, Take 100 mcg by mouth daily before breakfast. , Disp: , Rfl:  .  Melatonin 3 MG TABS, Take 3 mg by mouth at bedtime. , Disp: , Rfl:  .  nitrofurantoin, macrocrystal-monohydrate, (MACROBID) 100 MG capsule, Take 100 mg by mouth at bedtime. , Disp: , Rfl:  .  OLIVE LEAF EXTRACT PO, Take 500 mg by mouth 2 (two) times daily. , Disp: , Rfl:  .  Omega-3 Fatty Acids (FISH OIL PO), Take 1,400 mg by mouth 2 (two) times daily., Disp: , Rfl:  .  pantoprazole (PROTONIX) 40 MG tablet, Take 1 tablet (40 mg total) by mouth daily., Disp: 45 tablet, Rfl: 0 .  Polyethyl Glycol-Propyl Glycol (SYSTANE OP), Place 1 drop into both eyes at bedtime as needed (dry eyes)., Disp: , Rfl:  .  Probiotic Product (ALIGN) 4 MG CAPS, Take 4 mg by mouth  daily as needed (colitis flare)., Disp: , Rfl:  .  RESTASIS 0.05 % ophthalmic emulsion, Place 1 drop into both eyes 2 (two) times daily., Disp: , Rfl:  .  TURMERIC PO, Take 1,000 mg by mouth every Monday, Wednesday, and Friday., Disp: , Rfl:   PAST MEDICAL HISTORY: Past Medical History:  Diagnosis Date  . Anxiety    conditional, taking in preparation for surgery   . 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 Bicuspid aortic valve 05/10/2014  . Chronic diastolic congestive heart failure ([place])   . Collagenous colitis   . Compression, esophagus 2013   stretched in the past   . Encounter for therapeutic drug monitoring 06/15/2014  . Exertional dyspnea 05/10/2014  . Foot swelling    - LEFT-ever since she had an injury as a child   .  GERD (gastroesophageal reflux disease)    prevacid on occasion  . H/O hiatal hernia   . HLD (hyperlipidemia)   . Hx of echocardiogram    post AVR >> Echo (12/15):  Mild LVH, EF 55%, AVR ok (Mean gradient (S): 10 mm Hg), mild MR, mild LAE  . Hypothyroidism   . Orthostatic hypotension 05/10/2014  . Postoperative atrial fibrillation ([place]) 06/09/2014  . 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/07/2014   23 mm [facility] Ease bovine pericardial tissue valve  . Severe aortic stenosis 05/21/2014   s/p AVR 05/2014  . TIA (transient ischemic attack)   . UTI (urinary tract infection)    frequent UTI-----pt. reports that she takes Cipro if needed, last UTI- July 2015    PAST SURGICAL HISTORY: Past Surgical History:  Procedure Laterality Date  . AORTIC VALVE REPLACEMENT N/A 06/07/2014   Procedure: AORTIC VALVE REPLACEMENT  (AVR) with 72m Aortic Perimount Magna Ease;  Surgeon: CRexene Alberts MD;  [facility]  Service: Open Heart Surgery;  Laterality: N/A;  . APPENDECTOMY    . ATRIAL FIBRILLATION ABLATION N/A 05/19/2019   Procedure: ATRIAL FIBRILLATION ABLATION;  Surgeon: AThompson Grayer MD;   [facility];  Service: Cardiovascular;  Laterality: N/A;  . BLADDER SURGERY     x2 for tacking  . CARDIAC CATHETERIZATION    . cardiolite  2003, 2006  . CHOLECYSTECTOMY    . ESOPHAGEAL DILATION    . INTRAOPERATIVE TRANSESOPHAGEAL ECHOCARDIOGRAM N/A 06/07/2014   Procedure: INTRAOPERATIVE TRANSESOPHAGEAL ECHOCARDIOGRAM;  Surgeon: CRexene Alberts MD;  [facility]  Service: Open Heart Surgery;  Laterality: N/A;  . LEFT AND RIGHT HEART CATHETERIZATION WITH CORONARY ANGIOGRAM N/A 05/25/2014   Procedure: LEFT AND RIGHT HEART CATHETERIZATION WITH CORONARY ANGIOGRAM;  Surgeon: DLarey Dresser MD;  [facility];  Service: Cardiovascular;  Laterality: N/A;  . LOOP RECORDER INSERTION N/A 01/07/2018   Procedure: LOOP RECORDER INSERTION;  Surgeon: AThompson Grayer MD;  [facility];  Service: Cardiovascular;  Laterality: N/A;  . NASAL SEPTUM SURGERY  1995  . THUMB ARTHROSCOPY Left 2006   ? replacement of ligament   . TONSILLECTOMY    . TOTAL ABDOMINAL HYSTERECTOMY     PARTIAL    FAMILY HISTORY: Family History  Problem Relation Age of Onset  . CAD Father 626 . Heart attack Father   . Heart attack Paternal Grandfather   . Stroke Paternal Grandmother   . CVA Other   . Hypertension Neg Hx     SOCIAL HISTORY:  Social History   Socioeconomic History  . Marital status: Married    Spouse name: Not on file  . Number of children: 2  . Years of education: Not on file  . Highest education level: Not on file  Occupational History  . Not on file  Tobacco Use  . Smoking status: Never Smoker  . Smokeless tobacco: Never Used  Substance and Sexual Activity  . Alcohol use: No  . Drug use: No  . Sexual activity: Not on file  Other Topics Concern  . Not on file  Social History Narrative   Right handed    Lives with husband      Caffeine use: some coffee/soft drinks   Social Determinants of Health   Financial Resource Strain:   . Difficulty of  Paying Living Expenses: Not on file  Food Insecurity:   . Worried About RCharity fundraiserin the Last Year: Not on  file  . [place] in the Last Year: Not on file  Transportation Needs:   . Lack of Transportation (Medical): Not on file  . Lack of Transportation (Non-Medical): Not on file  Physical Activity:   . Days of Exercise per Week: Not on file  . Minutes of Exercise per Session: Not on file  Stress:   . Feeling of Stress : Not on file  Social Connections:   . Frequency of Communication with Friends and Family: Not on file  . Frequency of Social Gatherings with Friends and Family: Not on file  . Attends Religious Services: Not on file  . Active Member of Clubs or Organizations: Not on file  . Attends Archivist Meetings: Not on file  . Marital Status: Not on file  Intimate Partner Violence:   . Fear of Current or Ex-Partner: Not on file  . Emotionally Abused: Not on file  . Physically Abused: Not on file  . Sexually Abused: Not on file     PHYSICAL EXAM  Vitals:   10/11/19 1415  BP: 122/80  Pulse: 66  Temp: 97.6 F (36.4 C)  SpO2: 98%  Weight: 152 lb 8 oz (69.2 kg)  Height: 5' 4.5" (1.638 m)    Body mass index is 25.77 kg/m.   General: The patient is well-developed and well-nourished and in no acute distress  HEENT:  Head is [place]/AT.  Sclera are anicteric.  Funduscopic exam shows normal optic discs and retinal vessels.  Neck: No carotid bruits are noted.  The neck is nontender.  Cardiovascular: The heart has an irrregular rate and rhythm with a normal S1 and S2. There were no murmurs, gallops or rubs.    Skin: Extremities are without rash or  edema.  Musculoskeletal:  Back is nontender  Neurologic Exam  Mental status: The patient is alert and oriented x 3 at the time of the examination.  She had reduced short-term memory, attention and executive function.  Speech is normal.  Cranial nerves: Extraocular movements are full. Pupils are  equal, round, and reactive to light and accomodation.  Visual fields are full.  Facial symmetry is present. There is good facial sensation to soft touch bilaterally.Facial strength is normal.  Trapezius and sternocleidomastoid strength is normal. No dysarthria is noted.  The tongue is midline, and the patient has symmetric elevation of the soft palate. No obvious hearing deficits are noted.  Motor:  Muscle bulk is normal.   Tone is normal. Strength is  5 / 5 in all 4 extremities.   Sensory: Sensory testing is intact to pinprick, soft touch and vibration sensation in all 4 extremities.  Coordination: Cerebellar testing reveals good finger-nose-finger and heel-to-shin bilaterally.  Gait and station: Station is normal.   Gait is normal for age.  Tandem gait is wide.. Romberg is negative.   Reflexes: Deep tendon reflexes are symmetric and normal bilaterally.   Plantar responses are flexor.    DIAGNOSTIC DATA (LABS, IMAGING, TESTING) - I reviewed patient records, labs, notes, testing and imaging myself where available.  Lab Results  Component Value Date   WBC 10.1 05/16/2019   HGB 14.5 05/16/2019   HCT 42.6 05/16/2019   MCV 86 05/16/2019   PLT 209 05/16/2019      Component Value Date/Time   NA 141 05/16/2019 0748   K 4.4 05/16/2019 0748   CL 104 05/16/2019 0748   CO2 21 05/16/2019 0748   GLUCOSE 95 05/16/2019 0748   GLUCOSE 104 (H)  08/10/2017 0922   BUN 15 05/16/2019 0748   CREATININE 0.95 05/16/2019 0748   CREATININE 1.17 (H) 03/12/2016 1447   CALCIUM 9.4 05/16/2019 0748   PROT 6.9 08/10/2017 0910   PROT 6.2 06/15/2017 0845   ALBUMIN 3.7 08/10/2017 0910   ALBUMIN 4.3 06/15/2017 0845   AST 27 08/10/2017 0910   ALT 15 08/10/2017 0910   ALKPHOS 70 08/10/2017 0910   BILITOT 1.2 08/10/2017 0910   BILITOT 0.5 06/15/2017 0845   GFRNONAA 59 (L) 05/16/2019 0748   GFRAA 68 05/16/2019 0748        ASSESSMENT AND PLAN  Memory loss  Visual disturbance - Plan: US Carotid  Bilateral  Depression with anxiety   In summary, Ms. Menser is a 79 year old woman with memory loss and episodes of visual disturbance.  The etiology of the memory loss is not clear.  She did score only 20/30 on the [facility] cognitive assessment but her interaction with me seen near normal.  According to the husband she is about 75% better compared to a couple weeks ago.  Temporally, symptoms started in association with her COVID-19 vaccination and may be related.  As she is improving, the expectation is that she will continue to improve towards baseline.  A second problem is the episodes of whitening of her vision OS that occur frequently and last about 1 minute.  In the past she was diagnosed with migraine aura though whitening of vision for only a minute is not a typical presentation.  I think we do need to check a carotid Doppler to make sure that there is not a significant blockage though that would be expected to cause more of a blackening of vision.  She does have a lot of anxiety and it is possible that this plays a role.  I will have her start Escitalopram 5 mg to see if this can help the anxiety, mild depression and perhaps the episodes.  This can be titrated up if tolerated.  She will return to see [place] in 2 to 3 months or sooner if there are new or worsening neurologic symptoms.  Thank you for asking me to see Ms. Hestand.  Please let me know if I can be of further assistance with her or other patients in the future   Enisa Runyan A. Felecia Shelling, MD, [facility] 3/83/2919, 1:66 PM Certified in Neurology, Clinical Neurophysiology, Sleep Medicine and Neuroimaging  [facility] : [facility] [address], [place], [place] [address] [phone]

## 2019-10-20 ENCOUNTER — Other Ambulatory Visit: Payer: Self-pay | Admitting: *Deleted

## 2019-10-20 MED ORDER — APIXABAN 5 MG PO TABS: 5.0000 mg | ORAL_TABLET | Freq: Two times a day (BID) | ORAL | 1 refills | Status: DC

## 2019-10-20 NOTE — Telephone Encounter (Signed)
Prescription refill request for Eliquis received.  Last office visit: [place] 08/22/2019 Scr: 0.95, 05/16/2019 Age: 79 y.o. Weight: 69.2 kg   Prescription refill sent.

## 2019-10-27 ENCOUNTER — Telehealth: Payer: Self-pay | Admitting: Emergency Medicine

## 2019-10-27 ENCOUNTER — Ambulatory Visit (INDEPENDENT_AMBULATORY_CARE_PROVIDER_SITE_OTHER): Payer: Medicare Other | Admitting: *Deleted

## 2019-10-27 DIAGNOSIS — R002 Palpitations: Secondary | ICD-10-CM | POA: Diagnosis not present

## 2019-10-27 LAB — CUP PACEART REMOTE DEVICE CHECK
Date Time Interrogation Session: 20210304025211
Implantable Pulse Generator Implant Date: 20190516

## 2019-10-27 NOTE — Telephone Encounter (Signed)
Patient called due to questions in my chart message concerning episode of AF documented by her LINQ on 10/04/19 that lasted that 2 hours and 18 minutes. Patient concerned that she has had AF after ablation. Explained that it is possible to have episodes of AF post ablation and that is why she needs to continue her Eliquis as a stroke prevention measure. Educated that event was short in duration and if she has future episodes in the future changes to her treatment plan may be necessary but at this time none were required. Her husband reports that she was having a CT scam shortly before the episode of AF and was under a lot of stress that day. Reassured that transmissions will be continued to be monitored and to call the office if she had any change in condition.

## 2019-10-27 NOTE — Progress Notes (Signed)
ILR Remote 

## 2019-11-08 ENCOUNTER — Emergency Department (HOSPITAL_COMMUNITY): Payer: Medicare Other

## 2019-11-08 ENCOUNTER — Encounter (HOSPITAL_COMMUNITY): Payer: Self-pay

## 2019-11-08 ENCOUNTER — Emergency Department (HOSPITAL_COMMUNITY)
Admission: EM | Admit: 2019-11-08 | Discharge: 2019-11-08 | Disposition: A | Discharge status: Home / Self Care | Payer: Medicare Other | Attending: Emergency Medicine | Admitting: Emergency Medicine

## 2019-11-08 ENCOUNTER — Emergency Department (EMERGENCY_DEPARTMENT_HOSPITAL)
Admission: EM | Admit: 2019-11-08 | Discharge: 2019-11-10 | Disposition: A | Discharge status: Other Acute Inpatient Hospital | Payer: Medicare Other | Source: Home / Self Care | Attending: Emergency Medicine | Admitting: Emergency Medicine

## 2019-11-08 ENCOUNTER — Other Ambulatory Visit: Payer: Self-pay

## 2019-11-08 DIAGNOSIS — R45851 Suicidal ideations: Secondary | ICD-10-CM | POA: Diagnosis not present

## 2019-11-08 DIAGNOSIS — R4182 Altered mental status, unspecified: Secondary | ICD-10-CM | POA: Diagnosis not present

## 2019-11-08 DIAGNOSIS — F22 Delusional disorders: Secondary | ICD-10-CM | POA: Insufficient documentation

## 2019-11-08 DIAGNOSIS — Z046 Encounter for general psychiatric examination, requested by authority: Secondary | ICD-10-CM | POA: Insufficient documentation

## 2019-11-08 DIAGNOSIS — Z79899 Other long term (current) drug therapy: Secondary | ICD-10-CM | POA: Insufficient documentation

## 2019-11-08 DIAGNOSIS — F419 Anxiety disorder, unspecified: Secondary | ICD-10-CM | POA: Diagnosis not present

## 2019-11-08 DIAGNOSIS — E039 Hypothyroidism, unspecified: Secondary | ICD-10-CM | POA: Insufficient documentation

## 2019-11-08 DIAGNOSIS — Z20822 Contact with and (suspected) exposure to covid-19: Secondary | ICD-10-CM | POA: Diagnosis not present

## 2019-11-08 DIAGNOSIS — Z95818 Presence of other cardiac implants and grafts: Secondary | ICD-10-CM | POA: Insufficient documentation

## 2019-11-08 DIAGNOSIS — I5032 Chronic diastolic (congestive) heart failure: Secondary | ICD-10-CM | POA: Diagnosis not present

## 2019-11-08 DIAGNOSIS — Z7901 Long term (current) use of anticoagulants: Secondary | ICD-10-CM | POA: Insufficient documentation

## 2019-11-08 DIAGNOSIS — I2510 Atherosclerotic heart disease of native coronary artery without angina pectoris: Secondary | ICD-10-CM | POA: Insufficient documentation

## 2019-11-08 LAB — TSH: TSH: 1.281 u[IU]/mL (ref 0.350–4.500)

## 2019-11-08 LAB — CBC
HCT: 43.5 % (ref 36.0–46.0)
Hemoglobin: 14.2 g/dL (ref 12.0–15.0)
MCH: 28.7 pg (ref 26.0–34.0)
MCHC: 32.6 g/dL (ref 30.0–36.0)
MCV: 87.9 fL (ref 80.0–100.0)
Platelets: 190 10*3/uL (ref 150–400)
RBC: 4.95 MIL/uL (ref 3.87–5.11)
RDW: 13.4 % (ref 11.5–15.5)
WBC: 8.4 10*3/uL (ref 4.0–10.5)
nRBC: 0 % (ref 0.0–0.2)

## 2019-11-08 LAB — URINALYSIS, ROUTINE W REFLEX MICROSCOPIC
Bilirubin Urine: NEGATIVE
Glucose, UA: NEGATIVE mg/dL
Hgb urine dipstick: NEGATIVE
Ketones, ur: 5 mg/dL — AB
Leukocytes,Ua: NEGATIVE
Nitrite: NEGATIVE
Protein, ur: NEGATIVE mg/dL
Specific Gravity, Urine: 1.008 (ref 1.005–1.030)
pH: 7 (ref 5.0–8.0)

## 2019-11-08 LAB — COMPREHENSIVE METABOLIC PANEL
ALT: 16 U/L (ref 0–44)
AST: 21 U/L (ref 15–41)
Albumin: 3.9 g/dL (ref 3.5–5.0)
Alkaline Phosphatase: 62 U/L (ref 38–126)
Anion gap: 11 (ref 5–15)
BUN: 15 mg/dL (ref 8–23)
CO2: 20 mmol/L — ABNORMAL LOW (ref 22–32)
Calcium: 9.2 mg/dL (ref 8.9–10.3)
Chloride: 108 mmol/L (ref 98–111)
Creatinine, Ser: 0.84 mg/dL (ref 0.44–1.00)
GFR calc Af Amer: >60 mL/min (ref >60)
GFR calc non Af Amer: >60 mL/min (ref >60)
Glucose, Bld: 95 mg/dL (ref 70–99)
Potassium: 3.5 mmol/L (ref 3.5–5.1)
Sodium: 139 mmol/L (ref 135–145)
Total Bilirubin: 1.2 mg/dL (ref 0.3–1.2)
Total Protein: 6.6 g/dL (ref 6.5–8.1)

## 2019-11-08 LAB — RESPIRATORY PANEL BY RT PCR (FLU A&B, COVID)
Influenza A by PCR: NEGATIVE
Influenza B by PCR: NEGATIVE
SARS Coronavirus 2 by RT PCR: NEGATIVE

## 2019-11-08 LAB — RAPID URINE DRUG SCREEN, HOSP PERFORMED
Amphetamines: NOT DETECTED
Barbiturates: NOT DETECTED
Benzodiazepines: POSITIVE — AB
Cocaine: NOT DETECTED
Opiates: NOT DETECTED
Tetrahydrocannabinol: NOT DETECTED

## 2019-11-08 MED ORDER — LEVOTHYROXINE SODIUM 100 MCG PO TABS: 100.0000 ug | ORAL_TABLET | Freq: Every day | ORAL | Status: DC

## 2019-11-08 MED ORDER — ATORVASTATIN CALCIUM 20 MG PO TABS: 20.0000 mg | ORAL_TABLET | Freq: Every day | ORAL | Status: DC

## 2019-11-08 MED ORDER — OLANZAPINE 2.5 MG PO TABS: 2.5000 mg | ORAL_TABLET | Freq: Every day | ORAL | Status: DC

## 2019-11-08 MED ORDER — ALPRAZOLAM 0.5 MG PO TABS
0.5000 mg | ORAL_TABLET | Freq: Every day | ORAL | Status: DC | PRN
  Administered 2019-11-09: 0.5 mg via ORAL
  Filled 2019-11-08: qty 1

## 2019-11-08 MED ORDER — ALUM & MAG HYDROXIDE-SIMETH 200-200-20 MG/5ML PO SUSP: 30.0000 mL | Freq: Four times a day (QID) | ORAL | Status: DC | PRN

## 2019-11-08 MED ORDER — ATORVASTATIN CALCIUM 20 MG PO TABS
20.0000 mg | ORAL_TABLET | Freq: Every day | ORAL | Status: DC
  Administered 2019-11-08 – 2019-11-09 (×2): 20 mg via ORAL
  Filled 2019-11-08 (×2): qty 1

## 2019-11-08 MED ORDER — ALPRAZOLAM 0.5 MG PO TABS: 0.5000 mg | ORAL_TABLET | Freq: Every day | ORAL | Status: DC | PRN

## 2019-11-08 MED ORDER — ONDANSETRON HCL 4 MG PO TABS: 4.0000 mg | ORAL_TABLET | Freq: Three times a day (TID) | ORAL | Status: DC | PRN

## 2019-11-08 MED ORDER — ALPRAZOLAM 0.5 MG PO TABS
0.5000 mg | ORAL_TABLET | Freq: Once | ORAL | Status: AC
  Administered 2019-11-08: 0.5 mg via ORAL
  Filled 2019-11-08: qty 1

## 2019-11-08 MED ORDER — PANTOPRAZOLE SODIUM 40 MG PO TBEC: 40.0000 mg | DELAYED_RELEASE_TABLET | Freq: Every day | ORAL | Status: DC

## 2019-11-08 MED ORDER — ACETAMINOPHEN 325 MG PO TABS: 650.0000 mg | ORAL_TABLET | ORAL | Status: DC | PRN

## 2019-11-08 MED ORDER — APIXABAN 5 MG PO TABS: 5.0000 mg | ORAL_TABLET | Freq: Two times a day (BID) | ORAL | Status: DC

## 2019-11-08 MED ORDER — PANTOPRAZOLE SODIUM 40 MG PO TBEC
40.0000 mg | DELAYED_RELEASE_TABLET | Freq: Every day | ORAL | Status: DC
  Administered 2019-11-08 – 2019-11-09 (×2): 40 mg via ORAL
  Filled 2019-11-08 (×2): qty 1

## 2019-11-08 MED ORDER — LEVOTHYROXINE SODIUM 100 MCG PO TABS
100.0000 ug | ORAL_TABLET | Freq: Every day | ORAL | Status: DC
  Administered 2019-11-09 – 2019-11-10 (×2): 100 ug via ORAL
  Filled 2019-11-08 (×2): qty 1

## 2019-11-08 MED ORDER — OLANZAPINE 5 MG PO TABS
5.0000 mg | ORAL_TABLET | Freq: Every day | ORAL | Status: DC
  Administered 2019-11-08 – 2019-11-09 (×2): 5 mg via ORAL
  Filled 2019-11-08 (×2): qty 1

## 2019-11-08 MED ORDER — APIXABAN 5 MG PO TABS
5.0000 mg | ORAL_TABLET | Freq: Two times a day (BID) | ORAL | Status: DC
  Administered 2019-11-08 – 2019-11-10 (×4): 5 mg via ORAL
  Filled 2019-11-08 (×4): qty 1

## 2019-11-08 MED ORDER — ESCITALOPRAM OXALATE 10 MG PO TABS: 5.0000 mg | ORAL_TABLET | Freq: Every day | ORAL | Status: DC

## 2019-11-08 NOTE — ED Notes (Signed)
Pt moved to TCU, changed into scrubs and wanded by security. Pt bathed prior to changing into scrubs and then given warm blankets. Pt has call bell within reach and belongings moved to TCU.

## 2019-11-08 NOTE — ED Provider Notes (Signed)
[place] DEPT Provider Note   CSN: [id number] Arrival date & time: 11/08/19  1116     History Chief Complaint  Patient presents with  . Mental evaluation    Becky Winters is a 79 y.o. female.  HPI Level 5 caveat due to altered mental status.  Brought in due to worsening mental status.  The beginning of this year had Covid shots.  After giving shots became paranoid and began to have more memory issues.  States she is going crazy.  Also reporting that she is much more paranoid.  Has been seen by her PCP Dr. Forde Dandy reportedly did a full work-up without a clear cause found.  Had follow-up with Dr. Felecia Shelling from neurology.  He started her on a medicine which they state they only took half a pill of and she got much worse.  States it took 72 hours to get it out of her system.  It appears to been escitalopram.  She has not been taking the medicine since.  Has had a negative head CT.  Continues to be more confused however.  Patient wants to die but is not suicidal.  Patient has had a handful of her Xanax stating that she wants to take him to die but was not brave enough to do it.  Patient and family do not want any more of the medicine and do not want to see Dr. Felecia Shelling.    Past Medical History:  Diagnosis Date  . Anxiety    conditional, taking in preparation for surgery   . 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 Bicuspid aortic valve 05/10/2014  . Chronic diastolic congestive heart failure ([place])   . Collagenous colitis   . Compression, esophagus 2013   stretched in the past   . Encounter for therapeutic drug monitoring 06/15/2014  . Exertional dyspnea 05/10/2014  . Foot swelling    - LEFT-ever since she had an injury as a child   . GERD (gastroesophageal reflux disease)    prevacid on occasion  . H/O hiatal hernia   . HLD (hyperlipidemia)   . Hx of echocardiogram    post AVR >> Echo (12/15):  Mild LVH, EF 55%,  AVR ok (Mean gradient (S): 10 mm Hg), mild MR, mild LAE  . Hypothyroidism   . Orthostatic hypotension 05/10/2014  . Postoperative atrial fibrillation ([place]) 06/09/2014  . 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 Severe aortic stenosis 05/21/2014   s/p AVR 05/2014  . TIA (transient ischemic attack)   . UTI (urinary tract infection)    frequent UTI-----pt. reports that she takes Cipro if needed, last UTI- July 2015    Patient Active Problem List   Diagnosis Date Noted  . Memory loss 10/11/2019  . Visual disturbance 10/11/2019  . Depression with anxiety 10/11/2019  . History of aortic valve replacement with bioprosthetic valve 06/15/2017  . Coronary artery disease involving native coronary artery of native heart without angina pectoris 06/15/2017  . Typical atrial flutter ([place]) 08/29/2015  . Palpitations 08/29/2015  . Dizziness 08/29/2015  . Paroxysmal atrial fibrillation ([place]) 08/25/2015  . Collagenous colitis   . TIA (transient ischemic attack)   . Hypothyroidism   . HLD (hyperlipidemia)   . Anxiety   . GERD (gastroesophageal reflux disease)   . Compression, esophagus   . Foot swelling   .  H/O hiatal hernia   . UTI (urinary tract infection)   . Arthritis   . Postoperative atrial fibrillation ([place]) 06/09/2014  . 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 Chronic diastolic heart failure ([place])   . Severe aortic stenosis 05/21/2014  . Dyspnea on exertion 05/10/2014  . Bicuspid aortic valve 05/10/2014  . Orthostatic hypotension 05/10/2014    Past Surgical History:  Procedure Laterality Date  . AORTIC VALVE REPLACEMENT N/A 06/07/2014   Procedure: AORTIC VALVE REPLACEMENT  (AVR) with 7mm Aortic Perimount Magna Ease;  Surgeon: Rexene Alberts, MD;  Location: [facility];  Service: Open Heart Surgery;  Laterality: N/A;  . APPENDECTOMY    . ATRIAL FIBRILLATION ABLATION N/A  05/19/2019   Procedure: ATRIAL FIBRILLATION ABLATION;  Surgeon: Thompson Grayer, MD;  Location: [place] CV LAB;  Service: Cardiovascular;  Laterality: N/A;  . BLADDER SURGERY     x2 for tacking  . CARDIAC CATHETERIZATION    . cardiolite  2003, 2006  . CHOLECYSTECTOMY    . ESOPHAGEAL DILATION    . INTRAOPERATIVE TRANSESOPHAGEAL ECHOCARDIOGRAM N/A 06/07/2014   Procedure: INTRAOPERATIVE TRANSESOPHAGEAL ECHOCARDIOGRAM;  Surgeon: Rexene Alberts, MD;  Location: [facility];  Service: Open Heart Surgery;  Laterality: N/A;  . LEFT AND RIGHT HEART CATHETERIZATION WITH CORONARY ANGIOGRAM N/A 05/25/2014   Procedure: LEFT AND RIGHT HEART CATHETERIZATION WITH CORONARY ANGIOGRAM;  Surgeon: Larey Dresser, MD;  Location: [facility] CATH LAB;  Service: Cardiovascular;  Laterality: N/A;  . LOOP RECORDER INSERTION N/A 01/07/2018   Procedure: LOOP RECORDER INSERTION;  Surgeon: Thompson Grayer, MD;  Location: [place] CV LAB;  Service: Cardiovascular;  Laterality: N/A;  . NASAL SEPTUM SURGERY  1995  . THUMB ARTHROSCOPY Left 2006   ? replacement of ligament   . TONSILLECTOMY    . TOTAL ABDOMINAL HYSTERECTOMY     PARTIAL     OB History   No obstetric history on file.     Family History  Problem Relation Age of Onset  . CAD Father 75  . Heart attack Father   . Heart attack Paternal Grandfather   . Stroke Paternal Grandmother   . CVA Other   . Hypertension Neg Hx     Social History   Tobacco Use  . Smoking status: Never Smoker  . Smokeless tobacco: Never Used  Substance Use Topics  . Alcohol use: No  . Drug use: No    Home Medications Prior to Admission medications   Medication Sig Start Date End Date Taking? Authorizing Provider  ALPRAZolam (XANAX) 0.25 MG tablet Take 1 tablet (0.25 mg total) by mouth daily as needed for sleep. Patient taking differently: Take 0.5 mg by mouth daily as needed for sleep.  09/14/15   Larey Dresser, MD  apixaban (ELIQUIS) 5 MG TABS tablet Take 1 tablet (5 mg total) by  mouth 2 (two) times daily. 10/20/19   Allred, Jeneen Rinks, MD  atorvastatin (LIPITOR) 40 MG tablet Take 20 mg by mouth at bedtime.     [provider]  azithromycin (ZITHROMAX) 250 MG tablet Take (2) tablets by mouth 30 minutes before dental procedure Patient not taking: Reported on 10/11/2019 12/30/18   Jerline Pain, MD  beta carotene w/minerals (OCUVITE) tablet Take 1 tablet by mouth 2 (two) times daily.     [provider]  escitalopram (LEXAPRO) 5 MG tablet Take 1 tablet (5 mg total) by mouth daily. 10/11/19   Sater, Nanine Means, MD  levothyroxine (SYNTHROID, LEVOTHROID) 100 MCG tablet Take  100 mcg by mouth daily before breakfast.  09/07/15   [provider]  pantoprazole (PROTONIX) 40 MG tablet Take 1 tablet (40 mg total) by mouth daily. 05/19/19 07/03/19  Allred, Jeneen Rinks, MD    Allergies    Amiodarone, Clindamycin/lincomycin, Asacol [mesalamine], Codeine, Pentasa [mesalamine er], Sulfa antibiotics, and Amoxicillin  Review of Systems   Review of Systems  Constitutional: Positive for appetite change.  HENT: Negative for congestion.   Respiratory: Negative for shortness of breath.   Gastrointestinal: Negative for abdominal pain.  Genitourinary: Negative for flank pain.  Musculoskeletal: Negative for back pain.  Skin: Negative for rash.  Neurological: Negative for weakness and headaches.  Psychiatric/Behavioral: Positive for confusion, decreased concentration and suicidal ideas. The patient is nervous/anxious.     Physical Exam Updated Vital Signs BP 128/76   Pulse 76   Temp 97.7 F (36.5 C) (Oral)   Resp 13   SpO2 95%   Physical Exam Vitals and nursing note reviewed.  HENT:     Head: Atraumatic.  Eyes:     Pupils: Pupils are equal, round, and reactive to light.  Cardiovascular:     Rate and Rhythm: Normal rate and regular rhythm.  Pulmonary:     Breath sounds: Normal breath sounds. No wheezing.  Abdominal:     Tenderness: There is no abdominal tenderness.    Musculoskeletal:        General: Normal range of motion.  Skin:    General: Skin is warm.  Neurological:     Mental Status: She is alert.  Psychiatric:     Comments: Patient is very anxious and somewhat tearful.     ED Results / Procedures / Treatments   Labs (all labs ordered are listed, but only abnormal results are displayed) Labs Reviewed  COMPREHENSIVE METABOLIC PANEL - Abnormal; Notable for the following components:      Result Value   CO2 20 (*)    All other components within normal limits  RESPIRATORY PANEL BY RT PCR (FLU A&B, COVID)  TSH  CBC  URINALYSIS, ROUTINE W REFLEX MICROSCOPIC  RAPID URINE DRUG SCREEN, HOSP PERFORMED    EKG EKG Interpretation  Date/Time:  Tuesday November 08 2019 13:35:39 EDT Ventricular Rate:  68 PR Interval:    QRS Duration: 106 QT Interval:  439 QTC Calculation: 467 R Axis:   60 Text Interpretation: Sinus rhythm Confirmed by Davonna Belling [phone]) on 11/08/2019 2:51:06 PM   Radiology DG Chest Portable 1 View  Result Date: 11/08/2019 CLINICAL DATA:  Altered mental status. EXAM: PORTABLE CHEST 1 VIEW COMPARISON:  July 13, 2014. FINDINGS: The heart size and mediastinal contours are within normal limits. Status post aortic valve repair. Both lungs are clear. The visualized skeletal structures are unremarkable. IMPRESSION: No active disease. Electronically Signed   By: Marijo Conception M.D.   On: 11/08/2019 12:34    Procedures Procedures (including critical care time)  Medications Ordered in ED Medications  ALPRAZolam Duanne Moron) tablet 0.5 mg (0.5 mg Oral Given 11/08/19 1342)    ED Course  I have reviewed the triage vital signs and the nursing notes.  Pertinent labs & imaging results that were available during my care of the patient were reviewed by me and considered in my medical decision making (see chart for details).    MDM Rules/Calculators/A&P                      Patient mental status change.  No history of dementia  but began  somewhat acutely after getting Covid shots.  Has had head CT is reassuring.  Reportedly had negative urine but cannot find results.  Has been worsening over the last couple months.  Has seen neurology without clear cause although thought could be related to the injection.  Will check urine.  Also discussed with Dr. Leonel Ramsay from neurology.  Will get MRI to further evaluate since it was new onset dementia but became acute.  Care will be turned over to oncoming provider. Final Clinical Impression(s) / ED Diagnoses Final diagnoses:  Paranoia [facility])    Rx / [place] Orders ED Discharge Orders    None       Davonna Belling, MD 11/08/19 1451

## 2019-11-08 NOTE — ED Provider Notes (Addendum)
Pt signed out by Dr. Alvino Chapel pending MRI.  MRI:  IMPRESSION: 1. No evidence of acute intracranial abnormality. 2. Stable, mild generalized parenchymal atrophy. 3. Mild ethmoid and maxillary sinus mucosal thickening. 4. Incidentally noted left atlantooccipital joint effusion.  Labs are unremarkable.  UDS + for Benzos, but she is on Xanax.  Pt is medically clear.  TTS eval ordered.   Isla Pence, MD 11/08/19 1707  Pt evaluated by TTS who recommended Zyprexa and an overnight obs.  Pt's husband does not want her on any new meds due to the experience she had with Lexapro.  Pt's husband wants to take her home.  Pt is not suicidal or homicidal.  Pt is stable for d/c.  Return if worse.   Isla Pence, MD 11/08/19 [phone]

## 2019-11-08 NOTE — BH Assessment (Signed)
Reviewed pt's chart with Talbot Grumbling, NP and discussed the [facility] Assessment that was completed earlier this evening by [facility] Sprinkle, TTS, and the disposition that was given by Earleen Newport, NP, recommending observation and monitoring overnight for safety and stability and and starting pt on Zyprexa with the goal that it will help pt's paranoia so she can possibly return home with her husband in the morning. Talbot Grumbling, NP, was in agreement with the disposition of Shuvon Rankin, NP.  Clinician made contact with Antonietta Breach, PA, at [phone], and provided her this information. Antonietta Breach, [place], expressed that this made sense to her and pt's orders were reviewed and it was discovered that Dr. Isla Pence, who had seen pt earlier today and ordered the second [place] when pt and her husband returned this evening, had already started pt on the Zyprexa.  Pt will be observed overnight for safety and stability and re-assessed in the morning by psychiatry. Her reaction/tolerance to the Zyprexa will be reviewed at that time.

## 2019-11-08 NOTE — ED Provider Notes (Signed)
[place] DEPT Provider Note   CSN: [id number] Arrival date & time: 11/08/19  2025     History No chief complaint on file.   Becky Winters is a 79 y.o. female.  Pt was seen here earlier for paranoia.  The pt had a full medical work up, including MRI.  The pt was seen by TTS who recommended Zyprexa.  The pt and her husband was very nervous about taking a new medication, so they wanted to leave.  I saw them and tried to convince pt to stay, but they wanted their discharge paper work.  A few hours later, the husband brought the pt back.  He said she "flipped out."  He said their children wanted her treated and pt says she wants treatment now.  She was afraid earlier that I was going to "euthanize" her or send her to jail.  I assured her that I would not be doing either of those things.        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 , R knee, degenerative spine ,L hip    Bicuspid aortic valve 05/10/2014   Chronic diastolic congestive heart failure ([place])    Collagenous colitis    Compression, esophagus 2013   stretched in the past    Encounter for therapeutic drug monitoring 06/15/2014   Exertional dyspnea 05/10/2014   Foot swelling    - LEFT-ever since she had an injury as a child    GERD (gastroesophageal reflux disease)    prevacid on occasion   H/O hiatal hernia    HLD (hyperlipidemia)    Hx of echocardiogram    post AVR >> Echo (12/15):  Mild LVH, EF 55%, AVR ok (Mean gradient (S): 10 mm Hg), mild MR, mild LAE   Hypothyroidism    Orthostatic hypotension 05/10/2014   Postoperative atrial fibrillation ([place]) 06/09/2014   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 reports that she takes Cipro if needed, last UTI- July 2015    Patient Active Problem List   Diagnosis Date Noted   Memory loss 10/11/2019   Visual disturbance 10/11/2019   Depression with anxiety 10/11/2019   History of aortic valve replacement with bioprosthetic valve 06/15/2017   Coronary artery disease involving native coronary artery of native heart without angina pectoris 06/15/2017   Typical atrial flutter ([place]) 08/29/2015   Palpitations 08/29/2015   Dizziness 08/29/2015   Paroxysmal atrial fibrillation ([place]) 08/25/2015   Collagenous colitis    TIA (transient ischemic attack)    Hypothyroidism    HLD (hyperlipidemia)    Anxiety    GERD (gastroesophageal reflux disease)    Compression, esophagus    Foot swelling    H/O hiatal hernia    UTI (urinary tract infection)    Arthritis    Postoperative atrial fibrillation ([place]) 06/09/2014   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/07/2014   Chronic diastolic heart failure ([facility])    Severe aortic stenosis 05/21/2014   Dyspnea on exertion 05/10/2014   Bicuspid aortic valve 05/10/2014   Orthostatic hypotension 05/10/2014    Past Surgical History:  Procedure Laterality  Date   AORTIC VALVE REPLACEMENT N/A 06/07/2014   Procedure: AORTIC VALVE REPLACEMENT  (AVR) with 52mm Aortic Perimount Magna Ease;  Surgeon: Rexene Alberts, MD;  Location: [place];  Service: Open Heart Surgery;  Laterality: N/A;   APPENDECTOMY     ATRIAL FIBRILLATION ABLATION N/A 05/19/2019   Procedure: ATRIAL FIBRILLATION ABLATION;  Surgeon: Thompson Grayer, MD;  Location: [place] CV LAB;  Service: Cardiovascular;  Laterality: N/A;   BLADDER SURGERY     x2 for tacking   CARDIAC CATHETERIZATION     cardiolite  2003, 2006   CHOLECYSTECTOMY     ESOPHAGEAL DILATION     INTRAOPERATIVE TRANSESOPHAGEAL ECHOCARDIOGRAM N/A 06/07/2014    Procedure: INTRAOPERATIVE TRANSESOPHAGEAL ECHOCARDIOGRAM;  Surgeon: Rexene Alberts, MD;  Location: [place];  Service: Open Heart Surgery;  Laterality: N/A;   LEFT AND RIGHT HEART CATHETERIZATION WITH CORONARY ANGIOGRAM N/A 05/25/2014   Procedure: LEFT AND RIGHT HEART CATHETERIZATION WITH CORONARY ANGIOGRAM;  Surgeon: Larey Dresser, MD;  Location: [facility] CATH LAB;  Service: Cardiovascular;  Laterality: N/A;   LOOP RECORDER INSERTION N/A 01/07/2018   Procedure: LOOP RECORDER INSERTION;  Surgeon: Thompson Grayer, MD;  Location: [place] CV LAB;  Service: Cardiovascular;  Laterality: N/A;   NASAL SEPTUM SURGERY  1995   THUMB ARTHROSCOPY Left 2006   ? replacement of ligament    TONSILLECTOMY     TOTAL ABDOMINAL HYSTERECTOMY     PARTIAL     OB History   No obstetric history on file.     Family History  Problem Relation Age of Onset   CAD Father 30   Heart attack Father    Heart attack Paternal Grandfather    Stroke Paternal Grandmother    CVA Other    Hypertension Neg Hx     Social History   Tobacco Use   Smoking status: Never Smoker   Smokeless tobacco: Never Used  Substance Use Topics   Alcohol use: No   Drug use: No    Home Medications Prior to Admission medications   Medication Sig Start Date End Date Taking? Authorizing Provider  ALPRAZolam (XANAX) 0.25 MG tablet Take 1 tablet (0.25 mg total) by mouth daily as needed for sleep. Patient taking differently: Take 0.5 mg by mouth daily as needed for sleep.  09/14/15   Larey Dresser, MD  apixaban (ELIQUIS) 5 MG TABS tablet Take 1 tablet (5 mg total) by mouth 2 (two) times daily. 10/20/19   Allred, Jeneen Rinks, MD  atorvastatin (LIPITOR) 40 MG tablet Take 20 mg by mouth at bedtime.     [provider]  azithromycin (ZITHROMAX) 250 MG tablet Take (2) tablets by mouth 30 minutes before dental procedure Patient not taking: Reported on 10/11/2019 12/30/18   Jerline Pain, MD  beta carotene w/minerals (OCUVITE) tablet  Take 1 tablet by mouth 2 (two) times daily.     [provider]  escitalopram (LEXAPRO) 5 MG tablet Take 1 tablet (5 mg total) by mouth daily. 10/11/19   Sater, Nanine Means, MD  levothyroxine (SYNTHROID, LEVOTHROID) 100 MCG tablet Take 100 mcg by mouth daily before breakfast.  09/07/15   [provider]  pantoprazole (PROTONIX) 40 MG tablet Take 1 tablet (40 mg total) by mouth daily. 05/19/19 07/03/19  Allred, Jeneen Rinks, MD    Allergies    Amiodarone, Clindamycin/lincomycin, Asacol [mesalamine], Codeine, Pentasa [mesalamine er], Sulfa antibiotics, and Amoxicillin  Review of Systems   Review of Systems  Psychiatric/Behavioral: Positive for behavioral problems. The patient is  nervous/anxious.   All other systems reviewed and are negative.   Physical Exam Updated Vital Signs BP (!) 148/79 (BP Location: Left Arm)    Pulse (!) 55    Temp (!) 97.4 F (36.3 C) (Oral)    Resp 19    SpO2 97%   Physical Exam Vitals and nursing note reviewed.  Constitutional:      Appearance: Normal appearance.  HENT:     Head: Normocephalic and atraumatic.     Right Ear: External ear normal.     Left Ear: External ear normal.     Nose: Nose normal.     Mouth/Throat:     Mouth: Mucous membranes are moist.     Pharynx: Oropharynx is clear.  Eyes:     Extraocular Movements: Extraocular movements intact.     Conjunctiva/sclera: Conjunctivae normal.     Pupils: Pupils are equal, round, and reactive to light.  Cardiovascular:     Rate and Rhythm: Normal rate and regular rhythm.     Pulses: Normal pulses.     Heart sounds: Normal heart sounds.  Pulmonary:     Effort: Pulmonary effort is normal.     Breath sounds: Normal breath sounds.  Abdominal:     General: Abdomen is flat. Bowel sounds are normal.     Palpations: Abdomen is soft.  Musculoskeletal:        General: Normal range of motion.     Cervical back: Normal range of motion and neck supple.  Skin:    General: Skin is warm.      Capillary Refill: Capillary refill takes less than 2 seconds.  Neurological:     Mental Status: She is alert.  Psychiatric:        Mood and Affect: Mood is anxious.        Thought Content: Thought content is paranoid.     ED Results / Procedures / Treatments   Labs (all labs ordered are listed, but only abnormal results are displayed) Labs Reviewed - No data to display  EKG None  Radiology MR BRAIN WO CONTRAST  Result Date: 11/08/2019 CLINICAL DATA:  Altered mental status (AMS), unclear cause. EXAM: MRI HEAD WITHOUT CONTRAST TECHNIQUE: Multiplanar, multiecho pulse sequences of the brain and surrounding structures were obtained without intravenous contrast. COMPARISON:  Head CT 10/04/2019, brain MRI 11/02/2015. FINDINGS: Brain: Mild intermittent motion degradation. There is no evidence of acute infarct. No evidence of intracranial mass. No midline shift or extra-axial fluid collection. No chronic intracranial blood products. There are few scattered punctate foci of T2 hyperintensity within the cerebral white matter which are nonspecific and not uncommonly seen in patients of this age. Findings are similar to prior MRI 11/02/2015. Stable, mild generalized parenchymal atrophy. Vascular: Flow voids maintained within the proximal large arterial vessels. Skull and upper cervical spine: No focal marrow lesion. Incidentally noted left atlantooccipital joint effusion. Sinuses/Orbits: Visualized orbits demonstrate no acute abnormality. Mild ethmoid and maxillary sinus mucosal thickening. No significant mastoid effusion. IMPRESSION: 1. No evidence of acute intracranial abnormality. 2. Stable, mild generalized parenchymal atrophy. 3. Mild ethmoid and maxillary sinus mucosal thickening. 4. Incidentally noted left atlantooccipital joint effusion. Electronically Signed   By: Kellie Simmering DO   On: 11/08/2019 16:51   DG Chest Portable 1 View  Result Date: 11/08/2019 CLINICAL DATA:  Altered mental status.  EXAM: PORTABLE CHEST 1 VIEW COMPARISON:  July 13, 2014. FINDINGS: The heart size and mediastinal contours are within normal limits. Status post aortic valve repair. Both lungs  are clear. The visualized skeletal structures are unremarkable. IMPRESSION: No active disease. Electronically Signed   By: Marijo Conception M.D.   On: 11/08/2019 12:34    Procedures Procedures (including critical care time)  Medications Ordered in ED Medications  ALPRAZolam (XANAX) tablet 0.5 mg (has no administration in time range)  apixaban (ELIQUIS) tablet 5 mg (has no administration in time range)  atorvastatin (LIPITOR) tablet 20 mg (has no administration in time range)  levothyroxine (SYNTHROID) tablet 100 mcg (has no administration in time range)  pantoprazole (PROTONIX) EC tablet 40 mg (has no administration in time range)  OLANZapine (ZYPREXA) tablet 5 mg (has no administration in time range)    ED Course  I have reviewed the triage vital signs and the nursing notes.  Pertinent labs & imaging results that were available during my care of the patient were reviewed by me and considered in my medical decision making (see chart for details).    MDM Rules/Calculators/A&P                      Pt's meds ordered.  She is medically clear.  Pt's husband is aware that I am going to put her on zyprexa per mental health request.  He is ok with it. Final disposition per TTS.  Final Clinical Impression(s) / ED Diagnoses Final diagnoses:  Paranoia [facility])    Rx / [place] Orders ED Discharge Orders    None       Isla Pence, MD 11/08/19 2219

## 2019-11-08 NOTE — ED Notes (Signed)
ED Provider at bedside. 

## 2019-11-08 NOTE — Progress Notes (Signed)
Pt became increasingly paranoid towards end of exam.... talked her into finishing the w/o imaging portion of the exam. She did not want to let me start an iv on her to complete the exam. Exam aborted per pt request and changed to w/o cm.

## 2019-11-08 NOTE — ED Triage Notes (Signed)
Patient returns to the ED for altered mental status and paranoia.

## 2019-11-08 NOTE — BH Assessment (Signed)
Assessment Note  Per EDP: Level 5 caveat due to altered mental status.  Brought in due to worsening mental status.  The beginning of this year had Covid shots.  After giving shots became paranoid and began to have more memory issues.  States she is going crazy.  Also reporting that she is much more paranoid.  Has been seen by her PCP Dr. Forde Dandy reportedly did a full work-up without a clear cause found.  Had follow-up with Dr. Felecia Shelling from neurology.  He started her on a medicine which they state they only took half a pill of and she got much worse.  States it took 72 hours to get it out of her system.  It appears to been escitalopram.  She has not been taking the medicine since.  Has had a negative head CT.  Continues to be more confused however.  Patient wants to die but is not suicidal.  Patient has had a handful of her Xanax stating that she wants to take him to die but was not brave enough to do it.  Patient and family do not want any more of the medicine and do not want to see Dr. Felecia Shelling.   TTS: Patient was seen with her husband Vanae Zittle [phone]) in the room.  Patient was extremely anxious and paranoid thinking that hospital staff was going to euthanize her.  Patient asked for husband to speak for her.  Husband states that patient has always struggled with mild depression and anxiety, but states that she has never had to be treated for it.  He states that patient was fine until she had the COVID Vaccine in January.  He states that her behavior changed the first of February to the point that he felt like she was slipping into dementia.  He states that he took her to see a neurologist and an MRI of her head was done and there was no evidence that she had any onset of dementia.  He states that patient has been taking Xanax prescribed by her PCP for sleep because she has not been sleeping well.  However, he states that the neurologist started her on Lexapro and she had a very bad reaction to it and it  made her worse.  He states that patient has been very paranoid thinking that she is being followed and everyone is out to get her and that she is scared and nervous all the time.  He states that she feels like her family wants nothing to do with her.  He states that patient has never been suicidal or homicidal in the past, but states that she told him that she had thoughts about overdosing, but told him that it was really a cry for her and that she was not brave enough to do it.  Husband states that he has since taken control of her medications.  Husband states that he is opposed to her going on psycho-tropic medications because of the reaction that she had to the Lexapro.  Patient has no history of any drug or alcohol use.  She has been experiencing some sleep disturbance in the past week, but her appetite has been good. Patient has no history of abuse or self-mutilation.  Patient is oriented and alert, but paranoid.  Her memory is mostly intact.  She keeps asking if she is going to be euthanized repeatedly.  Her judgment, insight and impulse control are partially impaired.  Patient does not appear to be responding to any internal stimuli.  Her eye contact is good and her speech coherent.  She is moderately anxious,  Diagnosis: F22 Paranoia  Past Medical History:  Past Medical History:  Diagnosis Date  . Anxiety    conditional, taking in preparation for surgery   . 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 Bicuspid aortic valve 05/10/2014  . Chronic diastolic congestive heart failure ([place])   . Collagenous colitis   . Compression, esophagus 2013   stretched in the past   . Encounter for therapeutic drug monitoring 06/15/2014  . Exertional dyspnea 05/10/2014  . Foot swelling    - LEFT-ever since she had an injury as a child   . GERD (gastroesophageal reflux disease)    prevacid on occasion  . H/O hiatal hernia   . HLD (hyperlipidemia)   . Hx of  echocardiogram    post AVR >> Echo (12/15):  Mild LVH, EF 55%, AVR ok (Mean gradient (S): 10 mm Hg), mild MR, mild LAE  . Hypothyroidism   . Orthostatic hypotension 05/10/2014  . Postoperative atrial fibrillation ([place]) 06/09/2014  . 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/07/2014   23 mm [facility] Ease bovine pericardial tissue valve  . Severe aortic stenosis 05/21/2014   s/p AVR 05/2014  . TIA (transient ischemic attack)   . UTI (urinary tract infection)    frequent UTI-----pt. reports that she takes Cipro if needed, last UTI- July 2015    Past Surgical History:  Procedure Laterality Date  . AORTIC VALVE REPLACEMENT N/A 06/07/2014   Procedure: AORTIC VALVE REPLACEMENT  (AVR) with 78mm Aortic Perimount Magna Ease;  Surgeon: Rexene Alberts, MD;  Location: [facility];  Service: Open Heart Surgery;  Laterality: N/A;  . APPENDECTOMY    . ATRIAL FIBRILLATION ABLATION N/A 05/19/2019   Procedure: ATRIAL FIBRILLATION ABLATION;  Surgeon: Thompson Grayer, MD;  Location: [facility];  Service: Cardiovascular;  Laterality: N/A;  . BLADDER SURGERY     x2 for tacking  . CARDIAC CATHETERIZATION    . cardiolite  2003, 2006  . CHOLECYSTECTOMY    . ESOPHAGEAL DILATION    . INTRAOPERATIVE TRANSESOPHAGEAL ECHOCARDIOGRAM N/A 06/07/2014   Procedure: INTRAOPERATIVE TRANSESOPHAGEAL ECHOCARDIOGRAM;  Surgeon: Rexene Alberts, MD;  Location: [facility];  Service: Open Heart Surgery;  Laterality: N/A;  . LEFT AND RIGHT HEART CATHETERIZATION WITH CORONARY ANGIOGRAM N/A 05/25/2014   Procedure: LEFT AND RIGHT HEART CATHETERIZATION WITH CORONARY ANGIOGRAM;  Surgeon: Larey Dresser, MD;  Location: [facility] CATH LAB;  Service: Cardiovascular;  Laterality: N/A;  . LOOP RECORDER INSERTION N/A 01/07/2018   Procedure: LOOP RECORDER INSERTION;  Surgeon: Thompson Grayer, MD;  Location: [facility];  Service: Cardiovascular;  Laterality: N/A;  . NASAL SEPTUM SURGERY  1995  . THUMB ARTHROSCOPY Left 2006   ?  replacement of ligament   . TONSILLECTOMY    . TOTAL ABDOMINAL HYSTERECTOMY     PARTIAL    Family History:  Family History  Problem Relation Age of Onset  . CAD Father 65  . Heart attack Father   . Heart attack Paternal Grandfather   . Stroke Paternal Grandmother   . CVA Other   . Hypertension Neg Hx     Social History:  reports that she has never smoked. She has never used smokeless tobacco. She reports that she does not drink alcohol or use drugs.  Additional Social History:  Alcohol / Drug Use Pain Medications:  see MAR Prescriptions: see MAR Over the Counter: see MAR History of alcohol / drug use?: No history of alcohol / drug abuse Longest period of sobriety (when/how long): N/A  CIWA: CIWA-Ar BP: (!) 113/56 Pulse Rate: (!) 58 COWS:    Allergies:  Allergies  Allergen Reactions  . Amiodarone Other (See Comments)    Dizziness, impaired vision (felt like eyes were crossed)  . Clindamycin/Lincomycin Itching and Rash    Full body rash  . Asacol [Mesalamine] Other (See Comments)    GI Upset  Severe diarrhea   . Codeine Nausea And Vomiting  . Pentasa [Mesalamine Er] Other (See Comments)    Severe diarrhea   . Sulfa Antibiotics Rash  . Amoxicillin Rash    Did it involve swelling of the face/tongue/throat, SOB, or low BP? No Did it involve sudden or severe rash/hives, skin peeling, or any reaction on the inside of your mouth or nose? No Did you need to seek medical attention at a hospital or doctor's office? No When did it last happen?10 + years If all above answers are "NO", may proceed with cephalosporin use.     Home Medications: (Not in a hospital admission)   OB/GYN Status:  No LMP recorded. Patient is postmenopausal.  General Assessment Data [facility] of Assessment: [facility] TTS Assessment: In system Is this a Tele or Face-to-Face Assessment?: Face-to-Face Is this an Initial Assessment or a Re-assessment for this encounter?: Initial  Assessment Patient Accompanied by:: Adult(husband) Permission Given to speak with another: Yes Name, Relationship and Phone Number: husband present in room Language Other than English: No Living Arrangements: Other (Comment)(lives with husband) What gender do you identify as?: Female Marital status: Married Pregnancy Status: No Living Arrangements: Spouse/significant other Can pt return to current living arrangement?: Yes Admission Status: Voluntary Is patient capable of signing voluntary admission?: Yes Referral Source: Self/Family/Friend Insurance type: Medicare/BCBS     Crisis Care Plan Living Arrangements: Spouse/significant other Legal Guardian: Other:(self) Name of Psychiatrist: none Name of Therapist: none  Education Status Is patient currently in school?: No Is the patient employed, unemployed or receiving disability?: Receiving disability income(retired)  Risk to self with the past 6 months Suicidal Ideation: Yes-Currently Present Has patient been a risk to self within the past 6 months prior to admission? : No Suicidal Intent: No Has patient had any suicidal intent within the past 6 months prior to admission? : No Is patient at risk for suicide?: Yes Suicidal Plan?: Yes-Currently Present Has patient had any suicidal plan within the past 6 months prior to admission? : No Specify Current Suicidal Plan: thought about overdosing Access to Means: Yes Specify Access to Suicidal Means: Xanax What has been your use of drugs/alcohol within the last 12 months?: none Previous Attempts/Gestures: No How many times?: 0 Other Self Harm Risks: none Triggers for Past Attempts: None known Intentional Self Injurious Behavior: None Family Suicide History: No Recent stressful life event(s): Other (Comment)(none reported) Persecutory voices/beliefs?: Yes Depression: Yes Depression Symptoms: Despondent, Insomnia, Feeling worthless/self pity Substance abuse history and/or  treatment for substance abuse?: No Suicide prevention information given to non-admitted patients: Yes  Risk to Others within the past 6 months Homicidal Ideation: No Does patient have any lifetime risk of violence toward others beyond the six months prior to admission? : No Thoughts of Harm to Others: No Current Homicidal Intent: No Current Homicidal Plan: No Access to Homicidal Means: No Identified Victim: none History of harm to others?: No Assessment of Violence: None Noted Violent Behavior  Description: none Does patient have access to weapons?: No Criminal Charges Pending?: No Does patient have a court date: No Is patient on probation?: No  Psychosis Hallucinations: None noted Delusions: (paranoid delusions that people want to hurt her)  Mental Status Report Appearance/Hygiene: Unremarkable Eye Contact: Good Motor Activity: Freedom of movement Speech: Logical/coherent Level of Consciousness: Alert Mood: Depressed, Anxious Affect: Flat Anxiety Level: Moderate Thought Processes: Circumstantial Judgement: Partial Orientation: Person, Place, Time, Situation Obsessive Compulsive Thoughts/Behaviors: Moderate  Cognitive Functioning Concentration: Decreased Memory: Remote Intact, Recent Impaired Is patient IDD: No Insight: Fair Impulse Control: Fair Appetite: Good Have you had any weight changes? : No Change Sleep: Decreased Total Hours of Sleep: (UTA) Vegetative Symptoms: None  ADLScreening [facility]) Patient's cognitive ability adequate to safely complete daily activities?: Yes Patient able to express need for assistance with ADLs?: Yes Independently performs ADLs?: Yes (appropriate for developmental age)  Prior Inpatient Therapy Prior Inpatient Therapy: No  Prior Outpatient Therapy Prior Outpatient Therapy: No Does patient have an ACCT team?: No Does patient have Intensive In-House Services?  : No Does patient have [facility] services? :  No Does patient have P4CC services?: No  ADL Screening (condition at time of admission) Patient's cognitive ability adequate to safely complete daily activities?: Yes Is the patient deaf or have difficulty hearing?: No Does the patient have difficulty seeing, even when wearing glasses/contacts?: No Does the patient have difficulty concentrating, remembering, or making decisions?: No Patient able to express need for assistance with ADLs?: Yes Does the patient have difficulty dressing or bathing?: No Independently performs ADLs?: Yes (appropriate for developmental age) Does the patient have difficulty walking or climbing stairs?: No Weakness of Legs: None Weakness of Arms/Hands: None  Home Assistive Devices/Equipment Home Assistive Devices/Equipment: None  Therapy Consults (therapy consults require a physician order) PT Evaluation Needed: No OT Evalulation Needed: No SLP Evaluation Needed: No Abuse/Neglect Assessment (Assessment to be complete while patient is alone) Abuse/Neglect Assessment Can Be Completed: Yes Physical Abuse: Denies Verbal Abuse: Denies Sexual Abuse: Denies Exploitation of patient/patient's resources: Denies Values / Beliefs Cultural Requests During Hospitalization: None Spiritual Requests During Hospitalization: None Consults Spiritual Care Consult Needed: No Transition of Care Team Consult Needed: No Advance Directives (For Healthcare) Does Patient Have a Medical Advance Directive?: Yes Type of Advance Directive: Healthcare Power of Attorney, Living will Would patient like information on creating a medical advance directive?: No - Patient declined Nutrition Screen- [facility] Has the patient recently lost weight without trying?: No Has the patient been eating poorly because of a decreased appetite?: No Malnutrition Screening Tool Score: 0        Disposition: Per Shuvon Rankin, NP, patient will need to be observed and monitored overnight for  safety and stability.  Shuvon spoke to Dr. Gilford Raid to see if she would dtart her on Zyprexa tonight and monitor her overnight to see if it helps with her paranoia so that she can possibly return home with her husband in the morning.  Disposition Initial Assessment Completed for this Encounter: Yes Disposition of Patient: (Overnight OBS)  On Site Evaluation by:   Reviewed with Physician:    Judeth Porch Keoni Risinger 11/08/2019 6:42 PM

## 2019-11-08 NOTE — ED Triage Notes (Signed)
Pt presents with c/o mental evaluation. Family with pt reports that she has had both Covid shots since the beginning of the year and since then, has had some altered mental status. Pt reports to me, "I'm crazy". Pt has been seen by her PCP and they do not see any times of dementia, has also had a CT scan of her brain. Pt reports to this RN that she is paranoid. Family reports she has good and bad days but that today is a bad day. Pt's family reports that she does want to die. Pt does report SI but says she doesn't feel that way today.

## 2019-11-08 NOTE — ED Notes (Signed)
Pt given blankets and a pillow. Pt sleeping at this time.

## 2019-11-08 NOTE — ED Notes (Signed)
Pt arrived calm and cooperative with husband at bedside.  Pt is in no distress.

## 2019-11-08 NOTE — ED Notes (Signed)
Pt states she is very anxious and wants "something to relax". Pt given warm blankets and repositioned in the bed.

## 2019-11-09 ENCOUNTER — Encounter (HOSPITAL_COMMUNITY): Payer: Self-pay | Admitting: Registered Nurse

## 2019-11-09 DIAGNOSIS — F22 Delusional disorders: Secondary | ICD-10-CM | POA: Diagnosis not present

## 2019-11-09 MED ORDER — OLANZAPINE 2.5 MG PO TABS
2.5000 mg | ORAL_TABLET | Freq: Every morning | ORAL | Status: DC
  Administered 2019-11-09: 12:00:00 2.5 mg via ORAL
  Filled 2019-11-09 (×2): qty 1

## 2019-11-09 NOTE — Consult Note (Signed)
Telepsych Consultation   Reason for Consult:  Paranoia Referring Physician:  Dirk Dress EDP Location of Patient: [facility] ED Location of Provider: [facility]  Patient Identification: Becky Winters MRN:  [id number] Principal Diagnosis: Paranoid ideation [facility]) Diagnosis:  Principal Problem:   Paranoid ideation ([place])   Total Time spent with patient: 30 minutes  Subjective:   Becky Winters is a 79 y.o. female patient admitted to [facility] with complaints of paranoia after second Covid vaccine injection  HPI:  Becky Winters, 79 y.o., female patient seen via tele psych by this provider, consulted with Dr. Mallie Darting; and chart reviewed on 11/09/19.  On evaluation Becky Winters she came to the hospital because she is feeling afraid all the time.  Patient husband at bedside and also collaborating information along with patient.  Patients husband reports that patient has always been an anxious person but in February 2021 after patients second Covid vaccination injection patient became very paranoid.  Patient states "I am afraid of everything.  I feel like people are watching me, I hear thing in the house and go looking for it."  When asked if she was having suicidal thoughts patient responded "I don't want to kill myself but I am tired of being paranoid.  One day I had a handful of Xanax and I went up to my husband and told him you know what I'm thinking about doing don't you.  I would never try to kill myself; I'm to scared to do anything like that. But sometimes I just don't care if I don't wake up cause I'm tired of feeling like this."  Patient denies any prior psychiatric history other than anxiety.  She has taking Xanax for a while but sporadically never often until recently.  Was recently prescribed Lexapro but husband states that it cause patient to go into a deep depression for about 2-3 days so he stopped the medication.    During evaluation Becky Winters is alert/oriented x 4; Patient was able  to give correct information for Place, [place]; Current president, prior president Current year, month, and day.  She calm/cooperative; and mood is anxious is congruent with affect.  She does not appear to be responding to internal/external stimuli or delusional thoughts at this time; but does appear very anxious and scared.  Patient denies suicidal/self-harm/homicidal ideation, and psychosis.  Patient answered question appropriately.     Past Psychiatric History: Anxiety  Risk to Self:  No Risk to Others:  No Prior Inpatient Therapy:  No Prior Outpatient Therapy:  No  Past Medical History:  Past Medical History:  Diagnosis Date  . Anxiety    conditional, taking in preparation for surgery   . 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 Bicuspid aortic valve 05/10/2014  . Chronic diastolic congestive heart failure ([place])   . Collagenous colitis   . Compression, esophagus 2013   stretched in the past   . Encounter for therapeutic drug monitoring 06/15/2014  . Exertional dyspnea 05/10/2014  . Foot swelling    - LEFT-ever since she had an injury as a child   . GERD (gastroesophageal reflux disease)    prevacid on occasion  . H/O hiatal hernia   . HLD (hyperlipidemia)   . Hx of echocardiogram    post AVR >> Echo (12/15):  Mild LVH, EF 55%, AVR ok (Mean gradient (S): 10 mm Hg), mild MR, mild LAE  .  Hypothyroidism   . Orthostatic hypotension 05/10/2014  . Postoperative atrial fibrillation ([place]) 06/09/2014  . 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 Severe aortic stenosis 05/21/2014   s/p AVR 05/2014  . TIA (transient ischemic attack)   . UTI (urinary tract infection)    frequent UTI-----pt. reports that she takes Cipro if needed, last UTI- July 2015    Past Surgical History:  Procedure Laterality Date  . AORTIC VALVE REPLACEMENT N/A 06/07/2014    Procedure: AORTIC VALVE REPLACEMENT  (AVR) with 49mm Aortic Perimount Magna Ease;  Surgeon: Rexene Alberts, MD;  Location: [place];  Service: Open Heart Surgery;  Laterality: N/A;  . APPENDECTOMY    . ATRIAL FIBRILLATION ABLATION N/A 05/19/2019   Procedure: ATRIAL FIBRILLATION ABLATION;  Surgeon: Thompson Grayer, MD;  Location: [place] [facility];  Service: Cardiovascular;  Laterality: N/A;  . BLADDER SURGERY     x2 for tacking  . CARDIAC CATHETERIZATION    . cardiolite  2003, 2006  . CHOLECYSTECTOMY    . ESOPHAGEAL DILATION    . INTRAOPERATIVE TRANSESOPHAGEAL ECHOCARDIOGRAM N/A 06/07/2014   Procedure: INTRAOPERATIVE TRANSESOPHAGEAL ECHOCARDIOGRAM;  Surgeon: Rexene Alberts, MD;  Location: [place];  Service: Open Heart Surgery;  Laterality: N/A;  . LEFT AND RIGHT HEART CATHETERIZATION WITH CORONARY ANGIOGRAM N/A 05/25/2014   Procedure: LEFT AND RIGHT HEART CATHETERIZATION WITH CORONARY ANGIOGRAM;  Surgeon: Larey Dresser, MD;  Location: [facility] CATH LAB;  Service: Cardiovascular;  Laterality: N/A;  . LOOP RECORDER INSERTION N/A 01/07/2018   Procedure: LOOP RECORDER INSERTION;  Surgeon: Thompson Grayer, MD;  Location: [place] [facility];  Service: Cardiovascular;  Laterality: N/A;  . NASAL SEPTUM SURGERY  1995  . THUMB ARTHROSCOPY Left 2006   ? replacement of ligament   . TONSILLECTOMY    . TOTAL ABDOMINAL HYSTERECTOMY     PARTIAL   Family History:  Family History  Problem Relation Age of Onset  . CAD Father 79  . Heart attack Father   . Heart attack Paternal Grandfather   . Stroke Paternal Grandmother   . CVA Other   . Hypertension Neg Hx    Family Psychiatric  History: Denies Social History:  Social History   Substance and Sexual Activity  Alcohol Use No     Social History   Substance and Sexual Activity  Drug Use No    Social History   Socioeconomic History  . Marital status: Married    Spouse name: Not on file  . Number of children: 2  . Years of education: Not on file  .  Highest education level: Not on file  Occupational History  . Not on file  Tobacco Use  . Smoking status: Never Smoker  . Smokeless tobacco: Never Used  Substance and Sexual Activity  . Alcohol use: No  . Drug use: No  . Sexual activity: Not on file  Other Topics Concern  . Not on file  Social History Narrative   Right handed    Lives with husband      Caffeine use: some coffee/soft drinks   Social Determinants of Health   Financial Resource Strain:   . Difficulty of Paying Living Expenses:   Food Insecurity:   . Worried About Charity fundraiser in the Last Year:   . Arboriculturist in the Last Year:   Transportation Needs:   . Film/video editor (Medical):   Marland Kitchen  Lack of Transportation (Non-Medical):   Physical Activity:   . Days of Exercise per Week:   . Minutes of Exercise per Session:   Stress:   . Feeling of Stress :   Social Connections:   . Frequency of Communication with Friends and Family:   . Frequency of Social Gatherings with Friends and Family:   . Attends Religious Services:   . Active Member of Clubs or Organizations:   . Attends Archivist Meetings:   Marland Kitchen Marital Status:    Additional Social History:    Allergies:   Allergies  Allergen Reactions  . Amiodarone Other (See Comments)    Dizziness, impaired vision (felt like eyes were crossed)  . Clindamycin/Lincomycin Itching and Rash    Full body rash  . Asacol [Mesalamine] Other (See Comments)    GI Upset  Severe diarrhea   . Codeine Nausea And Vomiting  . Pentasa [Mesalamine Er] Other (See Comments)    Severe diarrhea   . Sulfa Antibiotics Rash  . Amoxicillin Rash    Did it involve swelling of the face/tongue/throat, SOB, or low BP? No Did it involve sudden or severe rash/hives, skin peeling, or any reaction on the inside of your mouth or nose? No Did you need to seek medical attention at a hospital or doctor's office? No When did it last happen?10 + years If all above  answers are "NO", may proceed with cephalosporin use.     Labs:  Results for orders placed or performed during the hospital encounter of 11/08/19 (from the past 48 hour(s))  Comprehensive metabolic panel     Status: Abnormal   Collection Time: 11/08/19 12:05 PM  Result Value Ref Range   Sodium 139 135 - 145 mmol/L   Potassium 3.5 3.5 - 5.1 mmol/L   Chloride 108 98 - 111 mmol/L   CO2 20 (L) 22 - 32 mmol/L   Glucose, Bld 95 70 - 99 mg/dL    Comment: Glucose reference range applies only to samples taken after fasting for at least 8 hours.   BUN 15 8 - 23 mg/dL   Creatinine, Ser 0.84 0.44 - 1.00 mg/dL   Calcium 9.2 8.9 - 10.3 mg/dL   Total Protein 6.6 6.5 - 8.1 g/dL   Albumin 3.9 3.5 - 5.0 g/dL   AST 21 15 - 41 U/L   ALT 16 0 - 44 U/L   Alkaline Phosphatase 62 38 - 126 U/L   Total Bilirubin 1.2 0.3 - 1.2 mg/dL   GFR calc non Af Amer >60 >60 mL/min   GFR calc Af Amer >60 >60 mL/min   Anion gap 11 5 - 15    Comment: Performed at [facility], [place] [address] [place], [place] [address]  TSH     Status: None   Collection Time: 11/08/19 12:05 PM  Result Value Ref Range   TSH 1.281 0.350 - 4.500 uIU/mL    Comment: Performed by a 3rd Generation assay with a functional sensitivity of <=0.01 uIU/mL. Performed at [facility], [place] [address] [place], [place] [address]   CBC     Status: None   Collection Time: 11/08/19 12:05 PM  Result Value Ref Range   WBC 8.4 4.0 - 10.5 K/uL   RBC 4.95 3.87 - 5.11 MIL/uL   Hemoglobin 14.2 12.0 - 15.0 g/dL   HCT 43.5 36.0 - 46.0 %   MCV 87.9 80.0 - 100.0 fL   MCH 28.7 26.0 - 34.0 pg  MCHC 32.6 30.0 - 36.0 g/dL   RDW 13.4 11.5 - 15.5 %   Platelets 190 150 - 400 K/uL   nRBC 0.0 0.0 - 0.2 %    Comment: Performed at [facility], [place] [address], [place], [place] [address]  Respiratory Panel by RT PCR (Flu A&B, Covid) - Nasopharyngeal Swab     Status: None   Collection Time: 11/08/19  2:43 PM    Specimen: Nasopharyngeal Swab  Result Value Ref Range   SARS Coronavirus 2 by RT PCR NEGATIVE NEGATIVE    Comment: (NOTE) SARS-CoV-2 target nucleic acids are NOT DETECTED. The SARS-CoV-2 RNA is generally detectable in upper respiratoy specimens during the acute phase of infection. The lowest concentration of SARS-CoV-2 viral copies this assay can detect is 131 copies/mL. A negative result does not preclude SARS-Cov-2 infection and should not be used as the sole basis for treatment or other patient management decisions. A negative result may occur with  improper specimen collection/handling, submission of specimen other than nasopharyngeal swab, presence of viral mutation(s) within the areas targeted by this assay, and inadequate number of viral copies (<131 copies/mL). A negative result must be combined with clinical observations, patient history, and epidemiological information. The expected result is Negative. Fact Sheet for Patients:  PinkCheek.be Fact Sheet for Healthcare Providers:  GravelBags.it This test is not yet ap proved or cleared by the [place] FDA and  has been authorized for detection and/or diagnosis of SARS-CoV-2 by FDA under an Emergency Use Authorization (EUA). This EUA will remain  in effect (meaning this test can be used) for the duration of the COVID-19 declaration under Section 564(b)(1) of the Act, 21 U.S.C. section 360bbb-3(b)(1), unless the authorization is terminated or revoked sooner.    Influenza A by PCR NEGATIVE NEGATIVE   Influenza B by PCR NEGATIVE NEGATIVE    Comment: (NOTE) The Xpert Xpress SARS-CoV-2/FLU/RSV assay is intended as an aid in  the diagnosis of influenza from Nasopharyngeal swab specimens and  should not be used as a sole basis for treatment. Nasal washings and  aspirates are unacceptable for Xpert Xpress SARS-CoV-2/FLU/RSV  testing. Fact Sheet for  Patients: PinkCheek.be Fact Sheet for Healthcare Providers: GravelBags.it This test is not yet approved or cleared by the [place] FDA and  has been authorized for detection and/or diagnosis of SARS-CoV-2 by  FDA under an Emergency Use Authorization (EUA). This EUA will remain  in effect (meaning this test can be used) for the duration of the  Covid-19 declaration under Section 564(b)(1) of the Act, 21  U.S.C. section 360bbb-3(b)(1), unless the authorization is  terminated or revoked. Performed at [facility], [place] [address] [place], [place] [address]   Urinalysis, Routine w reflex microscopic     Status: Abnormal   Collection Time: 11/08/19  3:00 PM  Result Value Ref Range   Color, Urine YELLOW YELLOW   APPearance HAZY (A) CLEAR   Specific Gravity, Urine 1.008 1.005 - 1.030   pH 7.0 5.0 - 8.0   Glucose, UA NEGATIVE NEGATIVE mg/dL   Hgb urine dipstick NEGATIVE NEGATIVE   Bilirubin Urine NEGATIVE NEGATIVE   Ketones, ur 5 (A) NEGATIVE mg/dL   Protein, ur NEGATIVE NEGATIVE mg/dL   Nitrite NEGATIVE NEGATIVE   Leukocytes,Ua NEGATIVE NEGATIVE    Comment: Performed at [place] [address], [place],  [address]  Urine rapid drug screen (hosp performed)     Status: Abnormal   Collection Time: 11/08/19  3:00  PM  Result Value Ref Range   Opiates NONE DETECTED NONE DETECTED   Cocaine NONE DETECTED NONE DETECTED   Benzodiazepines POSITIVE (A) NONE DETECTED   Amphetamines NONE DETECTED NONE DETECTED   Tetrahydrocannabinol NONE DETECTED NONE DETECTED   Barbiturates NONE DETECTED NONE DETECTED    Comment: (NOTE) DRUG SCREEN FOR MEDICAL PURPOSES ONLY.  IF CONFIRMATION IS NEEDED FOR ANY PURPOSE, NOTIFY LAB WITHIN 5 DAYS. LOWEST DETECTABLE LIMITS FOR URINE DRUG SCREEN Drug Class                     Cutoff (ng/mL) Amphetamine and metabolites    1000 Barbiturate and  metabolites    200 Benzodiazepine                 [id number] Tricyclics and metabolites     300 Opiates and metabolites        300 Cocaine and metabolites        300 THC                            50 Performed at [facility], [place] [address] [place], [place] [address]     Medications:  Current Facility-Administered Medications  Medication Dose Route Frequency Provider Last Rate Last Admin  . ALPRAZolam Duanne Moron) tablet 0.5 mg  0.5 mg Oral Daily PRN Isla Pence, MD      . apixaban Arne Cleveland) tablet 5 mg  5 mg Oral BID Isla Pence, MD   5 mg at 11/09/19 0950  . atorvastatin (LIPITOR) tablet 20 mg  20 mg Oral QHS Isla Pence, MD   20 mg at 11/08/19 2354  . levothyroxine (SYNTHROID) tablet 100 mcg  100 mcg Oral QAC breakfast Isla Pence, MD   100 mcg at 11/09/19 0950  . OLANZapine (ZYPREXA) tablet 5 mg  5 mg Oral QHS Isla Pence, MD   5 mg at 11/08/19 2347  . pantoprazole (PROTONIX) EC tablet 40 mg  40 mg Oral Daily Isla Pence, MD   40 mg at 11/09/19 [id number]   Current Outpatient Medications  Medication Sig Dispense Refill  . ALPRAZolam (XANAX) 0.25 MG tablet Take 1 tablet (0.25 mg total) by mouth daily as needed for sleep. (Patient taking differently: Take 0.5 mg by mouth daily as needed for sleep. ) 30 tablet 0  . apixaban (ELIQUIS) 5 MG TABS tablet Take 1 tablet (5 mg total) by mouth 2 (two) times daily. 90 tablet 1  . atorvastatin (LIPITOR) 40 MG tablet Take 20 mg by mouth at bedtime.     . beta carotene w/minerals (OCUVITE) tablet Take 1 tablet by mouth 2 (two) times daily.     Marland Kitchen levothyroxine (SYNTHROID, LEVOTHROID) 100 MCG tablet Take 100 mcg by mouth daily before breakfast.     . azithromycin (ZITHROMAX) 250 MG tablet Take (2) tablets by mouth 30 minutes before dental procedure (Patient not taking: Reported on 10/11/2019) 6 each 1  . escitalopram (LEXAPRO) 5 MG tablet Take 1 tablet (5 mg total) by mouth daily. (Patient not taking: Reported on 11/08/2019)  30 tablet 5  . pantoprazole (PROTONIX) 40 MG tablet Take 1 tablet (40 mg total) by mouth daily. 45 tablet 0    Musculoskeletal: Strength & Muscle Tone: within normal limits Gait & Station: normal Patient leans: N/A  Psychiatric Specialty Exam: Physical Exam  Nursing note and vitals reviewed. Constitutional: She is oriented to person, place, and time. She appears well-developed and well-nourished.  Respiratory: Effort normal.  Musculoskeletal:     Cervical back: Normal range of motion.  Neurological: She is alert and oriented to person, place, and time.  Psychiatric: Her speech is normal and behavior is normal. Her mood appears anxious. Thought content is paranoid. She expresses no homicidal and no suicidal ideation.    Review of Systems  Psychiatric/Behavioral: Negative for self-injury. Agitation: Denies. Behavioral problem: Denies. Confusion: States that she does have some confusion about what is going on with her or why she is paranoid. Hallucinations: Denies. Sleep disturbance: States she has to take Xanax to help her sleep. Suicidal ideas: Reports that she has had thoughts that she doesn' t care if she doesn't wake up. The patient is nervous/anxious.   All other systems reviewed and are negative.   Blood pressure (!) 125/54, pulse 64, temperature 99.1 F (37.3 C), temperature source Oral, resp. rate 18, SpO2 97 %.There is no height or weight on file to calculate BMI.  General Appearance: Casual  Eye Contact:  Good  Speech:  Clear and Coherent and Normal Rate  Volume:  Normal  Mood:  Anxious  Affect:  Congruent  Thought Process:  Coherent, Goal Directed and Descriptions of Associations: Intact  Orientation:  Full (Time, Place, and Person)  Thought Content:  Hallucinations: Auditory and Paranoid Ideation  Suicidal Thoughts:  Reports some passive suicidal ideation but states she would never do it because she is to afraid to do it  Homicidal Thoughts:  No  Memory:  Immediate;    Good Recent;   Good  Judgement:  Impaired  Insight:  Fair  Psychomotor Activity:  Normal  Concentration:  Concentration: Fair and Attention Span: Fair  Recall:  Good  Fund of Knowledge:  Good  Language:  Good  Akathisia:  No  Handed:  Right  AIMS (if indicated):     Assets:  Communication Skills Desire for Improvement Housing Leisure Time Resilience Social Support Transportation  ADL's:  Intact  Cognition:  WNL  Sleep:        Treatment Plan Summary: Daily contact with patient to assess and evaluate symptoms and progress in treatment, Medication management and Plan Inpatient psychiatric treatment  Patient started on Zyprexa 2.5 mg; tolerated well; EDP increased to 5 mg last night.    Will order Zyprexa 2.5 mg in Q AM and 5 mg Q hs.    Disposition: Recommend psychiatric Inpatient admission when medically cleared.  This service was provided via telemedicine using a 2-way, interactive audio and video technology.  Names of all persons participating in this telemedicine service and their role in this encounter. Name: Earleen Newport Role: NP  Name: Dr. Mallie Darting Role: Psychiatrist  Name: Becky Winters Role: Patient   Name: Hanley Seamen Role: Patient husband    Earleen Newport, NP 11/09/2019 10:23 AM

## 2019-11-09 NOTE — Progress Notes (Signed)
CSW received a phone call from [place] at [facility]. They can extend a bed offer to the patient today. If the patient is not alert and oriented she will need to be IVC.   CSW called and spoke with RN, Eustaquio Maize. CSW relayed the information. RN was busy at the moment but would complete it when she had a moment.   CSW will continue to follow and assist with disposition.   Domenic Schwab, MSW, LCSW-A Clinical Disposition Social Worker [facility]/TTS [phone]

## 2019-11-09 NOTE — BH Assessment (Addendum)
Becky Winters   Per Becky Rankin, FNP, this voluntary pt requires psychiatric hospitalization at this time.  The following facilities have been contacted to seek placement for this pt, with results as noted:  Beds available, information sent, decision pending: Becky Winters  Unable to reach: Becky Winters (left message at 14:52) Becky Winters (left message at 15:25) Becky Winters (left message at 15:26) Becky Winters (left message at 15:51)  At capacity: Becky Winters, Becky Winters [phone]

## 2019-11-09 NOTE — Progress Notes (Signed)
CSW received a call from pt's RN stating that the R had attempted X3 to fax pt's IVC paperwork to [facility] as requeste by Boykin Nearing, but that for some reason the fax had not, "gone through" successfully.  This CSW who is working from off-campus stated that if the RN will place IVC paperwork in the chart, the CSW would leave a handoff for the 1st shift ED CSW, so that the ED CSW or disposition could:  1. Fax the IVC paperwork to [facility] for their perusal on 3/18 and: 2. Request the accepting information (# for report/room #, etc) so that pt can D/C to [facility] on the morning of 3/18.  CSW spoke to the EDP who is agreeable to placing a TOC consult so that this issue will alert TOC to this issue on 3/18.  CSW will also send a SECURE message to 1st shift Disposition.  RN/EDP updated.  CSW will continue to follow for D/C needs.  Alphonse Guild. Shylin Keizer  MSW, LCSW, LCAS, CSI Transitions of Care Clinical Social Worker Care Coordination Department Ph: [phone]

## 2019-11-09 NOTE — ED Notes (Signed)
Pt alert. Pt confused and disorganized. Pt worried, scared, paranoid thought process. Cooperative with care and mediation .

## 2019-11-09 NOTE — Progress Notes (Signed)
CSW called Kristen at [facility]. IVC was still in progress and had not been served to the patient.   CSW called Caryl Pina at [facility] to provide update. CSW spoke with other staff saying that Caryl Pina had gone home for the evening and they would not have night coverage.   Please fax IVC to [facility] at [phone] once completed. Disposition CSW will need to follow up in the morning for accepting information.   Domenic Schwab, MSW, LCSW-A Clinical Disposition Social Worker [facility]/TTS [phone]

## 2019-11-09 NOTE — Progress Notes (Signed)
Tried to fax IVC paperwork 3x without success. Will pass to attempt faxing in the morning to CSW.

## 2019-11-10 DIAGNOSIS — Z7901 Long term (current) use of anticoagulants: Secondary | ICD-10-CM | POA: Diagnosis not present

## 2019-11-10 DIAGNOSIS — B9620 Unspecified Escherichia coli [E. coli] as the cause of diseases classified elsewhere: Secondary | ICD-10-CM | POA: Diagnosis present

## 2019-11-10 DIAGNOSIS — I509 Heart failure, unspecified: Secondary | ICD-10-CM | POA: Diagnosis present

## 2019-11-10 DIAGNOSIS — K219 Gastro-esophageal reflux disease without esophagitis: Secondary | ICD-10-CM | POA: Diagnosis present

## 2019-11-10 DIAGNOSIS — I480 Paroxysmal atrial fibrillation: Secondary | ICD-10-CM | POA: Diagnosis not present

## 2019-11-10 DIAGNOSIS — I5032 Chronic diastolic (congestive) heart failure: Secondary | ICD-10-CM | POA: Diagnosis not present

## 2019-11-10 DIAGNOSIS — I951 Orthostatic hypotension: Secondary | ICD-10-CM | POA: Diagnosis not present

## 2019-11-10 DIAGNOSIS — I4891 Unspecified atrial fibrillation: Secondary | ICD-10-CM | POA: Diagnosis present

## 2019-11-10 DIAGNOSIS — R45851 Suicidal ideations: Secondary | ICD-10-CM | POA: Diagnosis present

## 2019-11-10 DIAGNOSIS — K52831 Collagenous colitis: Secondary | ICD-10-CM | POA: Diagnosis not present

## 2019-11-10 DIAGNOSIS — F333 Major depressive disorder, recurrent, severe with psychotic symptoms: Secondary | ICD-10-CM | POA: Diagnosis present

## 2019-11-10 DIAGNOSIS — Z20822 Contact with and (suspected) exposure to covid-19: Secondary | ICD-10-CM | POA: Diagnosis not present

## 2019-11-10 DIAGNOSIS — K5900 Constipation, unspecified: Secondary | ICD-10-CM | POA: Diagnosis not present

## 2019-11-10 DIAGNOSIS — I350 Nonrheumatic aortic (valve) stenosis: Secondary | ICD-10-CM | POA: Diagnosis not present

## 2019-11-10 DIAGNOSIS — G459 Transient cerebral ischemic attack, unspecified: Secondary | ICD-10-CM | POA: Diagnosis not present

## 2019-11-10 DIAGNOSIS — Q231 Congenital insufficiency of aortic valve: Secondary | ICD-10-CM | POA: Diagnosis not present

## 2019-11-10 DIAGNOSIS — H04129 Dry eye syndrome of unspecified lacrimal gland: Secondary | ICD-10-CM | POA: Diagnosis present

## 2019-11-10 DIAGNOSIS — I2510 Atherosclerotic heart disease of native coronary artery without angina pectoris: Secondary | ICD-10-CM | POA: Diagnosis present

## 2019-11-10 DIAGNOSIS — K449 Diaphragmatic hernia without obstruction or gangrene: Secondary | ICD-10-CM | POA: Diagnosis not present

## 2019-11-10 DIAGNOSIS — N3000 Acute cystitis without hematuria: Secondary | ICD-10-CM | POA: Diagnosis present

## 2019-11-10 DIAGNOSIS — H04123 Dry eye syndrome of bilateral lacrimal glands: Secondary | ICD-10-CM | POA: Diagnosis not present

## 2019-11-10 DIAGNOSIS — R0600 Dyspnea, unspecified: Secondary | ICD-10-CM | POA: Diagnosis not present

## 2019-11-10 DIAGNOSIS — E039 Hypothyroidism, unspecified: Secondary | ICD-10-CM | POA: Diagnosis not present

## 2019-11-10 DIAGNOSIS — F419 Anxiety disorder, unspecified: Secondary | ICD-10-CM | POA: Diagnosis present

## 2019-11-10 DIAGNOSIS — E785 Hyperlipidemia, unspecified: Secondary | ICD-10-CM | POA: Diagnosis present

## 2019-11-10 DIAGNOSIS — E079 Disorder of thyroid, unspecified: Secondary | ICD-10-CM | POA: Diagnosis present

## 2019-11-10 DIAGNOSIS — Z8673 Personal history of transient ischemic attack (TIA), and cerebral infarction without residual deficits: Secondary | ICD-10-CM | POA: Diagnosis not present

## 2019-11-10 DIAGNOSIS — F22 Delusional disorders: Secondary | ICD-10-CM | POA: Diagnosis not present

## 2019-11-10 DIAGNOSIS — Z79899 Other long term (current) drug therapy: Secondary | ICD-10-CM | POA: Diagnosis not present

## 2019-11-10 NOTE — Progress Notes (Signed)
TOC consult received for patient regarding [place] needs. Of note, patient is to be admitted to [facility] for geropsych this morning. CSW contacted EDP Dr. Roderic Palau to discuss (order was put in by another provider in the evening). EDP reports there is no need for [facility] for patient as patient is going to inpatient psych. No other TOC needs noted, CSW signing off.   [place], LCSW Transitions of Care Department [facility] ED [phone]

## 2019-11-10 NOTE — ED Notes (Signed)
Pt has accepting bed at [facility]. Accepting is Dr . Ananias Pilgrim. Going to room 422 A

## 2019-11-10 NOTE — ED Notes (Signed)
Pt DCd off unit to [facility] per provider. Pt calm, cooperative, paranoid, no s/s of distress. DC information given to sheriff for facility. Belongings given to sheriff for facility. Pt used w/c off unit. Pt escorted by MHT. Pt transported by sheriff.

## 2019-11-10 NOTE — ED Provider Notes (Signed)
Emergency Medicine Observation Re-evaluation Note  Becky Winters is a 79 y.o. female, seen on rounds today.  Pt initially presented to the ED for complaints of No chief complaint on file. Currently, the patient is awaiting placement  Physical Exam  BP 133/71 (BP Location: Left Arm)   Pulse 62   Temp (!) 96.4 F (35.8 C) (Oral)   Resp 18   SpO2 98%  Physical Exam Awake alert and in nad ED Course / MDM  EKG:    I have reviewed the labs performed to date as well as medications administered while in observation.   Plan  Current plan is for placement    Milton Ferguson, MD 11/10/19 [phone]

## 2019-11-28 ENCOUNTER — Telehealth: Payer: Medicare Other | Admitting: Internal Medicine

## 2019-11-28 ENCOUNTER — Ambulatory Visit (INDEPENDENT_AMBULATORY_CARE_PROVIDER_SITE_OTHER): Payer: Medicare Other | Admitting: *Deleted

## 2019-11-28 DIAGNOSIS — R002 Palpitations: Secondary | ICD-10-CM | POA: Diagnosis not present

## 2019-11-28 LAB — CUP PACEART REMOTE DEVICE CHECK
Date Time Interrogation Session: 20210404035414
Implantable Pulse Generator Implant Date: 20190516

## 2019-11-29 NOTE — Progress Notes (Signed)
ILR Remote 

## 2019-12-01 MED ORDER — APIXABAN 5 MG PO TABS: 5.0000 mg | ORAL_TABLET | Freq: Two times a day (BID) | ORAL | 1 refills | Status: DC

## 2019-12-05 DIAGNOSIS — F323 Major depressive disorder, single episode, severe with psychotic features: Secondary | ICD-10-CM | POA: Diagnosis not present

## 2019-12-05 DIAGNOSIS — F332 Major depressive disorder, recurrent severe without psychotic features: Secondary | ICD-10-CM | POA: Diagnosis not present

## 2019-12-07 DIAGNOSIS — K222 Esophageal obstruction: Secondary | ICD-10-CM | POA: Diagnosis not present

## 2019-12-07 DIAGNOSIS — I779 Disorder of arteries and arterioles, unspecified: Secondary | ICD-10-CM | POA: Diagnosis not present

## 2019-12-07 DIAGNOSIS — K529 Noninfective gastroenteritis and colitis, unspecified: Secondary | ICD-10-CM | POA: Diagnosis not present

## 2019-12-07 DIAGNOSIS — E038 Other specified hypothyroidism: Secondary | ICD-10-CM | POA: Diagnosis not present

## 2019-12-07 DIAGNOSIS — F333 Major depressive disorder, recurrent, severe with psychotic symptoms: Secondary | ICD-10-CM | POA: Diagnosis not present

## 2019-12-07 DIAGNOSIS — H547 Unspecified visual loss: Secondary | ICD-10-CM | POA: Diagnosis not present

## 2019-12-07 DIAGNOSIS — F332 Major depressive disorder, recurrent severe without psychotic features: Secondary | ICD-10-CM | POA: Diagnosis not present

## 2019-12-07 DIAGNOSIS — E559 Vitamin D deficiency, unspecified: Secondary | ICD-10-CM | POA: Diagnosis not present

## 2019-12-07 DIAGNOSIS — E7849 Other hyperlipidemia: Secondary | ICD-10-CM | POA: Diagnosis not present

## 2019-12-07 DIAGNOSIS — I480 Paroxysmal atrial fibrillation: Secondary | ICD-10-CM | POA: Diagnosis not present

## 2019-12-07 DIAGNOSIS — Z952 Presence of prosthetic heart valve: Secondary | ICD-10-CM | POA: Diagnosis not present

## 2019-12-07 DIAGNOSIS — N390 Urinary tract infection, site not specified: Secondary | ICD-10-CM | POA: Diagnosis not present

## 2019-12-07 DIAGNOSIS — R413 Other amnesia: Secondary | ICD-10-CM | POA: Diagnosis not present

## 2019-12-08 ENCOUNTER — Ambulatory Visit: Payer: Medicare Other | Admitting: Neurology

## 2019-12-14 DIAGNOSIS — F332 Major depressive disorder, recurrent severe without psychotic features: Secondary | ICD-10-CM | POA: Diagnosis not present

## 2019-12-20 DIAGNOSIS — F332 Major depressive disorder, recurrent severe without psychotic features: Secondary | ICD-10-CM | POA: Diagnosis not present

## 2019-12-22 DIAGNOSIS — F332 Major depressive disorder, recurrent severe without psychotic features: Secondary | ICD-10-CM | POA: Diagnosis not present

## 2019-12-28 DIAGNOSIS — N390 Urinary tract infection, site not specified: Secondary | ICD-10-CM | POA: Diagnosis not present

## 2019-12-28 DIAGNOSIS — R300 Dysuria: Secondary | ICD-10-CM | POA: Diagnosis not present

## 2019-12-29 LAB — CUP PACEART REMOTE DEVICE CHECK
Date Time Interrogation Session: 20210505035828
Implantable Pulse Generator Implant Date: 20190516

## 2020-01-02 ENCOUNTER — Ambulatory Visit (INDEPENDENT_AMBULATORY_CARE_PROVIDER_SITE_OTHER): Payer: Medicare Other | Admitting: *Deleted

## 2020-01-02 DIAGNOSIS — I480 Paroxysmal atrial fibrillation: Secondary | ICD-10-CM | POA: Diagnosis not present

## 2020-01-03 DIAGNOSIS — F332 Major depressive disorder, recurrent severe without psychotic features: Secondary | ICD-10-CM | POA: Diagnosis not present

## 2020-01-03 NOTE — Progress Notes (Signed)
Carelink Summary Report / Loop Recorder 

## 2020-02-06 ENCOUNTER — Ambulatory Visit (INDEPENDENT_AMBULATORY_CARE_PROVIDER_SITE_OTHER): Payer: Medicare Other | Admitting: *Deleted

## 2020-02-06 DIAGNOSIS — I480 Paroxysmal atrial fibrillation: Secondary | ICD-10-CM

## 2020-02-06 LAB — CUP PACEART REMOTE DEVICE CHECK
Date Time Interrogation Session: 20210613234648
Implantable Pulse Generator Implant Date: 20190516

## 2020-02-08 NOTE — Progress Notes (Signed)
Carelink Summary Report / Loop Recorder 

## 2020-03-06 ENCOUNTER — Other Ambulatory Visit: Payer: Self-pay

## 2020-03-06 ENCOUNTER — Ambulatory Visit (INDEPENDENT_AMBULATORY_CARE_PROVIDER_SITE_OTHER): Payer: Medicare Other | Admitting: Cardiology

## 2020-03-06 ENCOUNTER — Encounter: Payer: Self-pay | Admitting: Cardiology

## 2020-03-06 VITALS — BP 120/60 | HR 62 | Ht 64.5 in | Wt 136.0 lb

## 2020-03-06 DIAGNOSIS — Z953 Presence of xenogenic heart valve: Secondary | ICD-10-CM

## 2020-03-06 DIAGNOSIS — I5032 Chronic diastolic (congestive) heart failure: Secondary | ICD-10-CM | POA: Diagnosis not present

## 2020-03-06 DIAGNOSIS — I480 Paroxysmal atrial fibrillation: Secondary | ICD-10-CM

## 2020-03-06 NOTE — Progress Notes (Signed)
Cardiology Office Note:    Date:  03/06/2020   ID:  [name], DOB [birth year], MRN [id number]  PCP:  Reynold Bowen, MD  [facility] Cardiologist:  Candee Furbish, MD  [facility] Electrophysiologist:  None   Referring MD: Reynold Bowen, MD     History of Present Illness:    Becky Winters is a 79 y.o. female post atrial fibrillation ablation 05/19/2019 with Dr. Rayann Heman.  Doing well.  No fevers chills nausea vomiting syncope bleeding.  Seems to be doing quite well.  Walking at home.  Past Medical History:  Diagnosis Date  . Anxiety    conditional, taking in preparation for surgery   . 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 Bicuspid aortic valve 05/10/2014  . Chronic diastolic congestive heart failure ([place])   . Collagenous colitis   . Compression, esophagus 2013   stretched in the past   . Encounter for therapeutic drug monitoring 06/15/2014  . Exertional dyspnea 05/10/2014  . Foot swelling    - LEFT-ever since she had an injury as a child   . GERD (gastroesophageal reflux disease)    prevacid on occasion  . H/O hiatal hernia   . HLD (hyperlipidemia)   . Hx of echocardiogram    post AVR >> Echo (12/15):  Mild LVH, EF 55%, AVR ok (Mean gradient (S): 10 mm Hg), mild MR, mild LAE  . Hypothyroidism   . Orthostatic hypotension 05/10/2014  . Postoperative atrial fibrillation ([place]) 06/09/2014  . 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/07/2014   23 mm [facility] Ease bovine pericardial tissue valve  . Severe aortic stenosis 05/21/2014   s/p AVR 05/2014  . TIA (transient ischemic attack)   . UTI (urinary tract infection)    frequent UTI-----pt. reports that she takes Cipro if needed, last UTI- July 2015    Past Surgical History:  Procedure Laterality Date  . AORTIC VALVE REPLACEMENT N/A 06/07/2014   Procedure: AORTIC VALVE REPLACEMENT  (AVR) with 55mm Aortic Perimount Magna Ease;  Surgeon: Rexene Alberts, MD;  Location: [place];  Service: Open Heart Surgery;  Laterality: N/A;  . APPENDECTOMY    . ATRIAL FIBRILLATION ABLATION N/A 05/19/2019   Procedure: ATRIAL FIBRILLATION ABLATION;  Surgeon: Thompson Grayer, MD;  Location: [place] CV [facility];  Service: Cardiovascular;  Laterality: N/A;  . BLADDER SURGERY     x2 for tacking  . CARDIAC CATHETERIZATION    . cardiolite  2003, 2006  . CHOLECYSTECTOMY    . ESOPHAGEAL DILATION    . INTRAOPERATIVE TRANSESOPHAGEAL ECHOCARDIOGRAM N/A 06/07/2014   Procedure: INTRAOPERATIVE TRANSESOPHAGEAL ECHOCARDIOGRAM;  Surgeon: Rexene Alberts, MD;  Location: [place];  Service: Open Heart Surgery;  Laterality: N/A;  . LEFT AND RIGHT HEART CATHETERIZATION WITH CORONARY ANGIOGRAM N/A 05/25/2014   Procedure: LEFT AND RIGHT HEART CATHETERIZATION WITH CORONARY ANGIOGRAM;  Surgeon: Larey Dresser, MD;  Location: [facility] CATH LAB;  Service: Cardiovascular;  Laterality: N/A;  . LOOP RECORDER INSERTION N/A 01/07/2018   Procedure: LOOP RECORDER INSERTION;  Surgeon: Thompson Grayer, MD;  Location: [place] CV LAB;  Service: Cardiovascular;  Laterality: N/A;  . NASAL SEPTUM SURGERY  1995  . THUMB ARTHROSCOPY Left 2006   ? replacement of ligament   . TONSILLECTOMY    . TOTAL ABDOMINAL HYSTERECTOMY     PARTIAL    Current Medications: Current Meds  Medication Sig  . ALPRAZolam (XANAX) 0.25 MG  tablet Take 1 tablet (0.25 mg total) by mouth daily as needed for sleep.  Marland Kitchen apixaban (ELIQUIS) 5 MG TABS tablet Take 1 tablet (5 mg total) by mouth 2 (two) times daily.  Marland Kitchen atorvastatin (LIPITOR) 40 MG tablet Take 20 mg by mouth at bedtime.   Marland Kitchen azithromycin (ZITHROMAX) 250 MG tablet Take (2) tablets by mouth 30 minutes before dental procedure  . beta carotene w/minerals (OCUVITE) tablet Take 1 tablet by mouth 2 (two) times daily.   Marland Kitchen levothyroxine (SYNTHROID, LEVOTHROID) 100 MCG tablet Take 100 mcg by mouth daily before breakfast.   . mirtazapine (REMERON) 15 MG tablet Take 15 mg by mouth at  bedtime.  Marland Kitchen QUEtiapine (SEROQUEL) 25 MG tablet Take 25 mg by mouth at bedtime.  Marland Kitchen venlafaxine (EFFEXOR) 25 MG tablet Take 25 mg by mouth daily.     Allergies:   Amiodarone, Clindamycin/lincomycin, Asacol [mesalamine], Codeine, Pentasa [mesalamine er], Sulfa antibiotics, and Amoxicillin   Social History   Socioeconomic History  . Marital status: Married    Spouse name: Not on file  . Number of children: 2  . Years of education: Not on file  . Highest education level: Not on file  Occupational History  . Not on file  Tobacco Use  . Smoking status: Never Smoker  . Smokeless tobacco: Never Used  Vaping Use  . Vaping Use: Never used  Substance and Sexual Activity  . Alcohol use: No  . Drug use: No  . Sexual activity: Not on file  Other Topics Concern  . Not on file  Social History Narrative   Right handed    Lives with husband      Caffeine use: some coffee/soft drinks   Social Determinants of Health   Financial Resource Strain:   . Difficulty of Paying Living Expenses:   Food Insecurity:   . Worried About Charity fundraiser in the Last Year:   . Arboriculturist in the Last Year:   Transportation Needs:   . Film/video editor (Medical):   Marland Kitchen Lack of Transportation (Non-Medical):   Physical Activity:   . Days of Exercise per Week:   . Minutes of Exercise per Session:   Stress:   . Feeling of Stress :   Social Connections:   . Frequency of Communication with Friends and Family:   . Frequency of Social Gatherings with Friends and Family:   . Attends Religious Services:   . Active Member of Clubs or Organizations:   . Attends Archivist Meetings:   Marland Kitchen Marital Status:      Family History: The patient's family history includes CAD (age of onset: 72) in her father; CVA in an other family member; Heart attack in her father and paternal grandfather; Stroke in her paternal grandmother. There is no history of Hypertension.  ROS:   Please see the history of  present illness.     All other systems reviewed and are negative.  EKGs/Labs/Other Studies Reviewed:    The following studies were reviewed today:  ECHO 2020 1. The left ventricle has low normal systolic function, with an ejection  fraction of 50-55%. The cavity size was mildly dilated. Left ventricular  diastolic Doppler parameters are indeterminate.  2. Abnormal septal motion.  3. The right ventricle has normal systolic function. The cavity was  normal. There is no increase in right ventricular wall thickness.  4. Left atrial size was mildly dilated.  5. Right atrial size was mildly dilated.  6. Mild  thickening of the mitral valve leaflet.  7. 23 Perimount AVR bioprosthetic Gradients a bit high but stable since  echo done 03/19/17 No PVL.  8. The aorta is abnormal unless otherwise noted.  9. There is mild dilatation of the aortic root measuring 38 mm.   EKG:  EKG is  ordered today.  The ekg ordered today demonstrates sinus rhythm 62 no evidence of atrial fibrillation  Recent Labs: 11/08/2019: ALT 16; BUN 15; Creatinine, Ser 0.84; Hemoglobin 14.2; Platelets 190; Potassium 3.5; Sodium 139; TSH 1.281  Recent Lipid Panel    Component Value Date/Time   CHOL 117 06/15/2017 0845   TRIG 112 06/15/2017 0845   HDL 41 06/15/2017 0845   CHOLHDL 2.9 06/15/2017 0845   CHOLHDL 3.1 03/12/2016 1447   VLDL 27 03/12/2016 1447   LDLCALC 54 06/15/2017 0845    Physical Exam:    VS:  BP 120/60   Pulse 62   Ht 5' 4.5" (1.638 m)   Wt 136 lb (61.7 kg)   BMI 22.98 kg/m     Wt Readings from Last 3 Encounters:  03/06/20 136 lb (61.7 kg)  10/11/19 152 lb 8 oz (69.2 kg)  08/22/19 157 lb (71.2 kg)     GEN:  Well nourished, well developed in no acute distress HEENT: Normal NECK: No JVD; No carotid bruits LYMPHATICS: No lymphadenopathy CARDIAC: RRR, no murmurs, rubs, gallops RESPIRATORY:  Clear to auscultation without rales, wheezing or rhonchi  ABDOMEN: Soft, non-tender,  non-distended MUSCULOSKELETAL:  No edema; No deformity  SKIN: Warm and dry NEUROLOGIC:  Alert and oriented x 3 PSYCHIATRIC:  Normal affect   ASSESSMENT:    1. Paroxysmal atrial fibrillation (HCC)   2. S/P aortic valve replacement with bioprosthetic valve   3. Chronic diastolic heart failure (HCC)    PLAN:    In order of problems listed above:  Paroxysmal atrial fibrillation/flutter -Post ablation 2020 off of antiarrhythmic therapy.  Implantable loop recorder in place.  Dr. Rayann Heman has been monitoring. -Previous stated that she had been short of breath since she was born. -She is doing very well currently.  Sinus rhythm.  She is relieved.  Chronic anticoagulation -CHA2DS2-VASc score is 5.  Felt as though recent nausea was due to Xarelto and Eliquis.  Try to continue Eliquis but Dr. Rayann Heman did state that she may need to return to Coumadin but preferred Eliquis.  Snoring -She had had sleep study arranged previously by Roderic Palau in the atrial fibrillation clinic but she did not go forward with this.  Discussed this again with her.  May wish to desire to do this in the future.  Status post aortic valve replacement bioprosthetic -Dental prophylaxis.  Stable.  TIA -Currently stable secondary risk factor prevention  Amiodarone intolerance  Nonobstructive CAD -2015 left heart catheterization small circumflex with 40% proximal stenosis.  Normal right sided pressures.  Chronic diastolic heart failure -Has had shortness of breath with prior NYHA class III type symptoms.  States that this has been present since birth.  Currently, she is doing well.    Medication Adjustments/Labs and Tests Ordered: Current medicines are reviewed at length with the patient today.  Concerns regarding medicines are outlined above.  Orders Placed This Encounter  Procedures  . EKG 12-Lead   No orders of the defined types were placed in this encounter.   Patient Instructions  Medication  Instructions:  The current medical regimen is effective;  continue present plan and medications.  *If you need a refill on your  cardiac medications before your next appointment, please call your pharmacy*  Follow-Up: At [facility], you and your health needs are our priority.  As part of our continuing mission to provide you with exceptional heart care, we have created designated Provider Care Teams.  These Care Teams include your primary Cardiologist (physician) and Advanced Practice Providers (APPs -  Physician Assistants and Nurse Practitioners) who all work together to provide you with the care you need, when you need it.  We recommend signing up for the patient portal called "MyChart".  Sign up information is provided on this After Visit Summary.  MyChart is used to connect with patients for Virtual Visits (Telemedicine).  Patients are able to view lab/test results, encounter notes, upcoming appointments, etc.  Non-urgent messages can be sent to your provider as well.   To learn more about what you can do with MyChart, go to [email or web address].    Your next appointment:   12 month(s)  The format for your next appointment:   In Person  Provider:   Candee Furbish, MD   Thank you for choosing [facility]!!        Signed, Candee Furbish, MD  03/06/2020 10:02 AM    [place]

## 2020-03-06 NOTE — Patient Instructions (Signed)
Medication Instructions:  The current medical regimen is effective;  continue present plan and medications.  *If you need a refill on your cardiac medications before your next appointment, please call your pharmacy*  Follow-Up: At [facility], you and your health needs are our priority.  As part of our continuing mission to provide you with exceptional heart care, we have created designated Provider Care Teams.  These Care Teams include your primary Cardiologist (physician) and Advanced Practice Providers (APPs -  Physician Assistants and Nurse Practitioners) who all work together to provide you with the care you need, when you need it.  We recommend signing up for the patient portal called "MyChart".  Sign up information is provided on this After Visit Summary.  MyChart is used to connect with patients for Virtual Visits (Telemedicine).  Patients are able to view lab/test results, encounter notes, upcoming appointments, etc.  Non-urgent messages can be sent to your provider as well.   To learn more about what you can do with MyChart, go to [email or web address].    Your next appointment:   12 month(s)  The format for your next appointment:   In Person  Provider:   Mark Skains, MD   Thank you for choosing [facility]!!      

## 2020-03-12 ENCOUNTER — Ambulatory Visit (INDEPENDENT_AMBULATORY_CARE_PROVIDER_SITE_OTHER): Payer: Medicare Other | Admitting: *Deleted

## 2020-03-12 DIAGNOSIS — I480 Paroxysmal atrial fibrillation: Secondary | ICD-10-CM | POA: Diagnosis not present

## 2020-03-12 LAB — CUP PACEART REMOTE DEVICE CHECK
Date Time Interrogation Session: 20210718232250
Implantable Pulse Generator Implant Date: 20190516

## 2020-03-14 NOTE — Progress Notes (Signed)
Carelink Summary Report / Loop Recorder 

## 2020-04-15 LAB — CUP PACEART REMOTE DEVICE CHECK
Date Time Interrogation Session: 20210819001422
Implantable Pulse Generator Implant Date: 20190516

## 2020-04-16 ENCOUNTER — Ambulatory Visit (INDEPENDENT_AMBULATORY_CARE_PROVIDER_SITE_OTHER): Payer: Medicare Other | Admitting: *Deleted

## 2020-04-16 DIAGNOSIS — I495 Sick sinus syndrome: Secondary | ICD-10-CM | POA: Diagnosis not present

## 2020-04-20 NOTE — Progress Notes (Signed)
Carelink Summary Report / Loop Recorder 

## 2020-05-01 DIAGNOSIS — H524 Presbyopia: Secondary | ICD-10-CM | POA: Diagnosis not present

## 2020-05-01 DIAGNOSIS — H26493 Other secondary cataract, bilateral: Secondary | ICD-10-CM | POA: Diagnosis not present

## 2020-05-17 DIAGNOSIS — F332 Major depressive disorder, recurrent severe without psychotic features: Secondary | ICD-10-CM | POA: Diagnosis not present

## 2020-05-21 ENCOUNTER — Ambulatory Visit (INDEPENDENT_AMBULATORY_CARE_PROVIDER_SITE_OTHER): Payer: Medicare Other | Admitting: Emergency Medicine

## 2020-05-21 DIAGNOSIS — I480 Paroxysmal atrial fibrillation: Secondary | ICD-10-CM | POA: Diagnosis not present

## 2020-05-22 LAB — CUP PACEART REMOTE DEVICE CHECK
Date Time Interrogation Session: 20210919002959
Implantable Pulse Generator Implant Date: 20190516

## 2020-05-23 NOTE — Progress Notes (Signed)
Carelink Summary Report / Loop Recorder 

## 2020-05-31 DIAGNOSIS — H26492 Other secondary cataract, left eye: Secondary | ICD-10-CM | POA: Diagnosis not present

## 2020-06-05 DIAGNOSIS — N390 Urinary tract infection, site not specified: Secondary | ICD-10-CM | POA: Diagnosis not present

## 2020-06-05 DIAGNOSIS — K222 Esophageal obstruction: Secondary | ICD-10-CM | POA: Diagnosis not present

## 2020-06-05 DIAGNOSIS — I2510 Atherosclerotic heart disease of native coronary artery without angina pectoris: Secondary | ICD-10-CM | POA: Diagnosis not present

## 2020-06-05 DIAGNOSIS — E785 Hyperlipidemia, unspecified: Secondary | ICD-10-CM | POA: Diagnosis not present

## 2020-06-05 DIAGNOSIS — I700 Atherosclerosis of aorta: Secondary | ICD-10-CM | POA: Diagnosis not present

## 2020-06-05 DIAGNOSIS — Z23 Encounter for immunization: Secondary | ICD-10-CM | POA: Diagnosis not present

## 2020-06-05 DIAGNOSIS — R300 Dysuria: Secondary | ICD-10-CM | POA: Diagnosis not present

## 2020-06-05 DIAGNOSIS — I480 Paroxysmal atrial fibrillation: Secondary | ICD-10-CM | POA: Diagnosis not present

## 2020-06-05 DIAGNOSIS — I679 Cerebrovascular disease, unspecified: Secondary | ICD-10-CM | POA: Diagnosis not present

## 2020-06-05 DIAGNOSIS — Z7901 Long term (current) use of anticoagulants: Secondary | ICD-10-CM | POA: Diagnosis not present

## 2020-06-05 DIAGNOSIS — I6523 Occlusion and stenosis of bilateral carotid arteries: Secondary | ICD-10-CM | POA: Diagnosis not present

## 2020-06-05 DIAGNOSIS — G459 Transient cerebral ischemic attack, unspecified: Secondary | ICD-10-CM | POA: Diagnosis not present

## 2020-06-05 DIAGNOSIS — E039 Hypothyroidism, unspecified: Secondary | ICD-10-CM | POA: Diagnosis not present

## 2020-06-05 DIAGNOSIS — Z952 Presence of prosthetic heart valve: Secondary | ICD-10-CM | POA: Diagnosis not present

## 2020-06-11 ENCOUNTER — Other Ambulatory Visit: Payer: Self-pay | Admitting: Cardiology

## 2020-06-12 NOTE — Telephone Encounter (Signed)
Pt's pharmacy is requesting a refill on azithromycin. Would Dr. Marlou Porch like to refill this medication for a dental procedure? Please address

## 2020-06-13 ENCOUNTER — Ambulatory Visit (INDEPENDENT_AMBULATORY_CARE_PROVIDER_SITE_OTHER): Payer: Medicare Other

## 2020-06-13 DIAGNOSIS — I480 Paroxysmal atrial fibrillation: Secondary | ICD-10-CM

## 2020-06-13 LAB — CUP PACEART REMOTE DEVICE CHECK
Date Time Interrogation Session: 20211020021328
Implantable Pulse Generator Implant Date: 20190516

## 2020-06-14 DIAGNOSIS — H26491 Other secondary cataract, right eye: Secondary | ICD-10-CM | POA: Diagnosis not present

## 2020-06-19 NOTE — Progress Notes (Signed)
Carelink Summary Report / Loop Recorder 

## 2020-07-16 ENCOUNTER — Ambulatory Visit (INDEPENDENT_AMBULATORY_CARE_PROVIDER_SITE_OTHER): Payer: Medicare Other

## 2020-07-16 DIAGNOSIS — I480 Paroxysmal atrial fibrillation: Secondary | ICD-10-CM | POA: Diagnosis not present

## 2020-07-16 DIAGNOSIS — N3941 Urge incontinence: Secondary | ICD-10-CM | POA: Diagnosis not present

## 2020-07-16 DIAGNOSIS — N3021 Other chronic cystitis with hematuria: Secondary | ICD-10-CM | POA: Diagnosis not present

## 2020-07-16 DIAGNOSIS — N8111 Cystocele, midline: Secondary | ICD-10-CM | POA: Diagnosis not present

## 2020-07-16 DIAGNOSIS — R8271 Bacteriuria: Secondary | ICD-10-CM | POA: Diagnosis not present

## 2020-07-16 LAB — CUP PACEART REMOTE DEVICE CHECK
Date Time Interrogation Session: 20211120013202
Implantable Pulse Generator Implant Date: 20190516

## 2020-07-18 MED ORDER — APIXABAN 5 MG PO TABS: 5.0000 mg | ORAL_TABLET | Freq: Two times a day (BID) | ORAL | 1 refills | Status: DC

## 2020-07-18 NOTE — Progress Notes (Signed)
Carelink Summary Report / Loop Recorder 

## 2020-07-18 NOTE — Telephone Encounter (Signed)
I did call the patient and speak with her and her husband. I explained the copay card and the plan for next year.

## 2020-07-26 DIAGNOSIS — F332 Major depressive disorder, recurrent severe without psychotic features: Secondary | ICD-10-CM | POA: Diagnosis not present

## 2020-08-14 ENCOUNTER — Ambulatory Visit (INDEPENDENT_AMBULATORY_CARE_PROVIDER_SITE_OTHER): Payer: Medicare Other

## 2020-08-14 DIAGNOSIS — R3915 Urgency of urination: Secondary | ICD-10-CM | POA: Diagnosis not present

## 2020-08-14 DIAGNOSIS — I480 Paroxysmal atrial fibrillation: Secondary | ICD-10-CM | POA: Diagnosis not present

## 2020-08-14 DIAGNOSIS — N3021 Other chronic cystitis with hematuria: Secondary | ICD-10-CM | POA: Diagnosis not present

## 2020-08-14 DIAGNOSIS — N8111 Cystocele, midline: Secondary | ICD-10-CM | POA: Diagnosis not present

## 2020-08-14 LAB — CUP PACEART REMOTE DEVICE CHECK
Date Time Interrogation Session: 20211221013306
Implantable Pulse Generator Implant Date: 20190516

## 2020-08-29 ENCOUNTER — Other Ambulatory Visit: Payer: Self-pay

## 2020-08-29 ENCOUNTER — Telehealth: Payer: Self-pay | Admitting: Pharmacist

## 2020-08-29 ENCOUNTER — Ambulatory Visit (INDEPENDENT_AMBULATORY_CARE_PROVIDER_SITE_OTHER): Payer: Medicare Other | Admitting: Internal Medicine

## 2020-08-29 VITALS — BP 124/54 | HR 64 | Ht 64.5 in | Wt 135.0 lb

## 2020-08-29 DIAGNOSIS — I480 Paroxysmal atrial fibrillation: Secondary | ICD-10-CM | POA: Diagnosis not present

## 2020-08-29 DIAGNOSIS — R0683 Snoring: Secondary | ICD-10-CM | POA: Diagnosis not present

## 2020-08-29 DIAGNOSIS — D6869 Other thrombophilia: Secondary | ICD-10-CM | POA: Diagnosis not present

## 2020-08-29 DIAGNOSIS — I483 Typical atrial flutter: Secondary | ICD-10-CM | POA: Diagnosis not present

## 2020-08-29 DIAGNOSIS — Z953 Presence of xenogenic heart valve: Secondary | ICD-10-CM | POA: Diagnosis not present

## 2020-08-29 LAB — CUP PACEART INCLINIC DEVICE CHECK
Date Time Interrogation Session: 20220105171506
Implantable Pulse Generator Implant Date: 20190516

## 2020-08-29 NOTE — Progress Notes (Signed)
Carelink Summary Report / Loop Recorder 

## 2020-08-29 NOTE — Telephone Encounter (Signed)
Eliquis prior authorization has been submitted and approved through 08/29/21. Called pt and left message advising her of approval.

## 2020-08-29 NOTE — Patient Instructions (Addendum)
Medication Instructions:  Your physician recommends that you continue on your current medications as directed. Please refer to the Current Medication list given to you today.  *If you need a refill on your cardiac medications before your next appointment, please call your pharmacy*  Lab Work: BMP, CBC  If you have labs (blood work) drawn today and your tests are completely normal, you will receive your results only by: Marland Kitchen MyChart Message (if you have MyChart) OR . A paper copy in the mail If you have any lab test that is abnormal or we need to change your treatment, we will call you to review the results.  Testing/Procedures: None ordered.  Follow-Up: At [facility], you and your health needs are our priority.  As part of our continuing mission to provide you with exceptional heart care, we have created designated Provider Care Teams.  These Care Teams include your primary Cardiologist (physician) and Advanced Practice Providers (APPs -  Physician Assistants and Nurse Practitioners) who all work together to provide you with the care you need, when you need it.  We recommend signing up for the patient portal called "MyChart".  Sign up information is provided on this After Visit Summary.  MyChart is used to connect with patients for Virtual Visits (Telemedicine).  Patients are able to view lab/test results, encounter notes, upcoming appointments, etc.  Non-urgent messages can be sent to your provider as well.   To learn more about what you can do with MyChart, go to [email or web address].    Your next appointment:   Your physician wants you to follow-up in: as needed with Dr. Johney Frame.    Other Instructions:

## 2020-08-29 NOTE — Progress Notes (Signed)
PCP: Reynold Bowen, MD Primary Cardiologist: Dr Marlou Porch Primary EP: Dr Rayann Heman  Becky Winters is a 80 y.o. female who presents today for routine electrophysiology followup.  Since last being seen in our clinic, the patient reports doing very well.  She has stable SOB.  Today, she denies symptoms of palpitations, chest pain,  lower extremity edema, dizziness, presyncope, or syncope.  The patient is otherwise without complaint today.   Past Medical History:  Diagnosis Date  . Anxiety    conditional, taking in preparation for surgery   . 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 Bicuspid aortic valve 05/10/2014  . Chronic diastolic congestive heart failure ([place])   . Collagenous colitis   . Compression, esophagus 2013   stretched in the past   . Encounter for therapeutic drug monitoring 06/15/2014  . Exertional dyspnea 05/10/2014  . Foot swelling    - LEFT-ever since she had an injury as a child   . GERD (gastroesophageal reflux disease)    prevacid on occasion  . H/O hiatal hernia   . HLD (hyperlipidemia)   . Hx of echocardiogram    post AVR >> Echo (12/15):  Mild LVH, EF 55%, AVR ok (Mean gradient (S): 10 mm Hg), mild MR, mild LAE  . Hypothyroidism   . Orthostatic hypotension 05/10/2014  . Postoperative atrial fibrillation ([place]) 06/09/2014  . 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/07/2014   23 mm [facility] Ease bovine pericardial tissue valve  . Severe aortic stenosis 05/21/2014   s/p AVR 05/2014  . TIA (transient ischemic attack)   . UTI (urinary tract infection)    frequent UTI-----pt. reports that she takes Cipro if needed, last UTI- July 2015   Past Surgical History:  Procedure Laterality Date  . AORTIC VALVE REPLACEMENT N/A 06/07/2014   Procedure: AORTIC VALVE REPLACEMENT  (AVR) with 1mm Aortic Perimount Magna Ease;  Surgeon: Rexene Alberts, MD;  Location: [place];  Service: Open Heart Surgery;  Laterality:  N/A;  . APPENDECTOMY    . ATRIAL FIBRILLATION ABLATION N/A 05/19/2019   Procedure: ATRIAL FIBRILLATION ABLATION;  Surgeon: Thompson Grayer, MD;  Location: [place] CV LAB;  Service: Cardiovascular;  Laterality: N/A;  . BLADDER SURGERY     x2 for tacking  . CARDIAC CATHETERIZATION    . cardiolite  2003, 2006  . CHOLECYSTECTOMY    . ESOPHAGEAL DILATION    . INTRAOPERATIVE TRANSESOPHAGEAL ECHOCARDIOGRAM N/A 06/07/2014   Procedure: INTRAOPERATIVE TRANSESOPHAGEAL ECHOCARDIOGRAM;  Surgeon: Rexene Alberts, MD;  Location: [place];  Service: Open Heart Surgery;  Laterality: N/A;  . LEFT AND RIGHT HEART CATHETERIZATION WITH CORONARY ANGIOGRAM N/A 05/25/2014   Procedure: LEFT AND RIGHT HEART CATHETERIZATION WITH CORONARY ANGIOGRAM;  Surgeon: Larey Dresser, MD;  Location: [facility] CATH LAB;  Service: Cardiovascular;  Laterality: N/A;  . LOOP RECORDER INSERTION N/A 01/07/2018   Procedure: LOOP RECORDER INSERTION;  Surgeon: Thompson Grayer, MD;  Location: [place] CV LAB;  Service: Cardiovascular;  Laterality: N/A;  . NASAL SEPTUM SURGERY  1995  . THUMB ARTHROSCOPY Left 2006   ? replacement of ligament   . TONSILLECTOMY    . TOTAL ABDOMINAL HYSTERECTOMY     PARTIAL    ROS- all systems are reviewed and negatives except as per HPI above  Current Outpatient Medications  Medication Sig Dispense Refill  . apixaban (ELIQUIS) 5 MG TABS tablet Take 1 tablet (5 mg total) by  mouth 2 (two) times daily. 180 tablet 1  . atorvastatin (LIPITOR) 40 MG tablet Take 20 mg by mouth at bedtime.    Marland Kitchen azithromycin (ZITHROMAX) 250 MG tablet TAKE TWO TABLETS BY MOUTH 30 MINUTES BEFORE DENTAL PROCEDURE 2 tablet 2  . Cephalexin 250 MG tablet Take 250 mg by mouth daily.    Marland Kitchen levothyroxine (SYNTHROID, LEVOTHROID) 100 MCG tablet Take 100 mcg by mouth daily before breakfast.      No current facility-administered medications for this visit.    Physical Exam: Vitals:   08/29/20 1553  BP: (!) 124/54  Pulse: 64  SpO2: 94%   Weight: 135 lb (61.2 kg)  Height: 5' 4.5" (1.638 m)    GEN- The patient is well appearing, alert and oriented x 3 today.   Head- normocephalic, atraumatic Eyes-  Sclera clear, conjunctiva pink Ears- hearing intact Oropharynx- clear Lungs- Clear to ausculation bilaterally, normal work of breathing Heart- Regular rate and rhythm, no murmurs, rubs or gallops, PMI not laterally displaced GI- soft, NT, ND, + BS Extremities- no clubbing, cyanosis, or edema  Wt Readings from Last 3 Encounters:  08/29/20 135 lb (61.2 kg)  03/06/20 136 lb (61.7 kg)  10/11/19 152 lb 8 oz (69.2 kg)    EKG tracing ordered today is personally reviewed and shows sinus rhythm  Assessment and Plan:  1. Paroxysmal atrial fibrillation/ atrial flutter Well controlled (burden 2.9% by ILR) chads2vasc score is 5.  She states that her insurance will no longer cover eliquis and she wishes to switch to xarelto.  Though she thinks she had an intolerance to xarelto previously, she wishes to try this again. I will obtain bmet, cbc to allow for adequate dosing of her xarelto.  2. S/p AVR (bioprosthetic) Stable No change required today Follows with Dr Anne Fu  3. Chronic diastolic dysfunction Stable No change required today   Risks, benefits and potential toxicities for medications prescribed and/or refilled reviewed with patient today.   She wishes to follow with Dr Anne Fu and only see me as needed. I will follow her remotely with ILR.  She is aware that I am happy to see her anytime that she or Dr Anne Fu feels would be beneficial.  Hillis Range MD, [facility] 08/29/2020 4:23 PM

## 2020-08-30 LAB — BASIC METABOLIC PANEL
BUN/Creatinine Ratio: 25 (ref 12–28)
BUN: 25 mg/dL (ref 8–27)
CO2: 23 mmol/L (ref 20–29)
Calcium: 9.4 mg/dL (ref 8.7–10.3)
Chloride: 106 mmol/L (ref 96–106)
Creatinine, Ser: 1.01 mg/dL — ABNORMAL HIGH (ref 0.57–1.00)
GFR calc Af Amer: 62 mL/min/{1.73_m2} (ref >59)
GFR calc non Af Amer: 54 mL/min/{1.73_m2} — ABNORMAL LOW (ref >59)
Glucose: 90 mg/dL (ref 65–99)
Potassium: 4.3 mmol/L (ref 3.5–5.2)
Sodium: 141 mmol/L (ref 134–144)

## 2020-08-30 LAB — CBC WITH DIFFERENTIAL/PLATELET
Basophils Absolute: 0.1 10*3/uL (ref 0.0–0.2)
Basos: 1 %
EOS (ABSOLUTE): 0.1 10*3/uL (ref 0.0–0.4)
Eos: 1 %
Hematocrit: 41.7 % (ref 34.0–46.6)
Hemoglobin: 14 g/dL (ref 11.1–15.9)
Immature Grans (Abs): 0 10*3/uL (ref 0.0–0.1)
Immature Granulocytes: 0 %
Lymphocytes Absolute: 3 10*3/uL (ref 0.7–3.1)
Lymphs: 42 %
MCH: 28.6 pg (ref 26.6–33.0)
MCHC: 33.6 g/dL (ref 31.5–35.7)
MCV: 85 fL (ref 79–97)
Monocytes Absolute: 0.6 10*3/uL (ref 0.1–0.9)
Monocytes: 9 %
Neutrophils Absolute: 3.4 10*3/uL (ref 1.4–7.0)
Neutrophils: 47 %
Platelets: 182 10*3/uL (ref 150–450)
RBC: 4.89 x10E6/uL (ref 3.77–5.28)
RDW: 13.1 % (ref 11.7–15.4)
WBC: 7.2 10*3/uL (ref 3.4–10.8)

## 2020-09-14 ENCOUNTER — Ambulatory Visit (INDEPENDENT_AMBULATORY_CARE_PROVIDER_SITE_OTHER): Payer: Medicare Other

## 2020-09-14 DIAGNOSIS — I480 Paroxysmal atrial fibrillation: Secondary | ICD-10-CM

## 2020-09-14 LAB — CUP PACEART REMOTE DEVICE CHECK
Date Time Interrogation Session: 20220121013714
Implantable Pulse Generator Implant Date: 20190516

## 2020-09-26 NOTE — Progress Notes (Signed)
Carelink Summary Report / Loop Recorder 

## 2020-10-15 ENCOUNTER — Ambulatory Visit (INDEPENDENT_AMBULATORY_CARE_PROVIDER_SITE_OTHER): Payer: Medicare Other

## 2020-10-15 DIAGNOSIS — I480 Paroxysmal atrial fibrillation: Secondary | ICD-10-CM

## 2020-10-15 LAB — CUP PACEART REMOTE DEVICE CHECK
Date Time Interrogation Session: 20220221025639
Implantable Pulse Generator Implant Date: 20190516

## 2020-10-16 DIAGNOSIS — Z1231 Encounter for screening mammogram for malignant neoplasm of breast: Secondary | ICD-10-CM | POA: Diagnosis not present

## 2020-10-23 NOTE — Progress Notes (Signed)
Carelink Summary Report / Loop Recorder 

## 2020-11-15 ENCOUNTER — Ambulatory Visit (INDEPENDENT_AMBULATORY_CARE_PROVIDER_SITE_OTHER): Payer: Medicare Other

## 2020-11-15 DIAGNOSIS — I480 Paroxysmal atrial fibrillation: Secondary | ICD-10-CM | POA: Diagnosis not present

## 2020-11-15 LAB — CUP PACEART REMOTE DEVICE CHECK
Date Time Interrogation Session: 20220324035734
Implantable Pulse Generator Implant Date: 20190516

## 2020-11-27 NOTE — Progress Notes (Signed)
Carelink Summary Report / Loop Recorder 

## 2020-12-04 DIAGNOSIS — E039 Hypothyroidism, unspecified: Secondary | ICD-10-CM | POA: Diagnosis not present

## 2020-12-04 DIAGNOSIS — E785 Hyperlipidemia, unspecified: Secondary | ICD-10-CM | POA: Diagnosis not present

## 2020-12-04 DIAGNOSIS — E559 Vitamin D deficiency, unspecified: Secondary | ICD-10-CM | POA: Diagnosis not present

## 2020-12-11 DIAGNOSIS — I679 Cerebrovascular disease, unspecified: Secondary | ICD-10-CM | POA: Diagnosis not present

## 2020-12-11 DIAGNOSIS — Z7901 Long term (current) use of anticoagulants: Secondary | ICD-10-CM | POA: Diagnosis not present

## 2020-12-11 DIAGNOSIS — I700 Atherosclerosis of aorta: Secondary | ICD-10-CM | POA: Diagnosis not present

## 2020-12-11 DIAGNOSIS — Z23 Encounter for immunization: Secondary | ICD-10-CM | POA: Diagnosis not present

## 2020-12-11 DIAGNOSIS — I480 Paroxysmal atrial fibrillation: Secondary | ICD-10-CM | POA: Diagnosis not present

## 2020-12-11 DIAGNOSIS — Z0000 Encounter for general adult medical examination without abnormal findings: Secondary | ICD-10-CM | POA: Diagnosis not present

## 2020-12-11 DIAGNOSIS — Z1339 Encounter for screening examination for other mental health and behavioral disorders: Secondary | ICD-10-CM | POA: Diagnosis not present

## 2020-12-11 DIAGNOSIS — Z1331 Encounter for screening for depression: Secondary | ICD-10-CM | POA: Diagnosis not present

## 2020-12-11 DIAGNOSIS — I6523 Occlusion and stenosis of bilateral carotid arteries: Secondary | ICD-10-CM | POA: Diagnosis not present

## 2020-12-11 DIAGNOSIS — I2510 Atherosclerotic heart disease of native coronary artery without angina pectoris: Secondary | ICD-10-CM | POA: Diagnosis not present

## 2020-12-11 DIAGNOSIS — E785 Hyperlipidemia, unspecified: Secondary | ICD-10-CM | POA: Diagnosis not present

## 2020-12-11 DIAGNOSIS — N1831 Chronic kidney disease, stage 3a: Secondary | ICD-10-CM | POA: Diagnosis not present

## 2020-12-11 DIAGNOSIS — Z952 Presence of prosthetic heart valve: Secondary | ICD-10-CM | POA: Diagnosis not present

## 2020-12-11 DIAGNOSIS — R82998 Other abnormal findings in urine: Secondary | ICD-10-CM | POA: Diagnosis not present

## 2020-12-11 DIAGNOSIS — G459 Transient cerebral ischemic attack, unspecified: Secondary | ICD-10-CM | POA: Diagnosis not present

## 2020-12-17 ENCOUNTER — Ambulatory Visit (INDEPENDENT_AMBULATORY_CARE_PROVIDER_SITE_OTHER): Payer: Medicare Other

## 2020-12-17 DIAGNOSIS — G459 Transient cerebral ischemic attack, unspecified: Secondary | ICD-10-CM | POA: Diagnosis not present

## 2020-12-18 LAB — CUP PACEART REMOTE DEVICE CHECK
Date Time Interrogation Session: 20220424041051
Implantable Pulse Generator Implant Date: 20190516

## 2021-01-04 NOTE — Progress Notes (Signed)
Carelink Summary Report / Loop Recorder 

## 2021-01-14 ENCOUNTER — Other Ambulatory Visit: Payer: Self-pay | Admitting: Internal Medicine

## 2021-01-14 NOTE — Telephone Encounter (Signed)
Eliquis 5mg  refill request received. Patient is 80 years old, weight-61.2kg, Crea-1.01 on 08/29/2020, Diagnosis-Afib, and last seen by Dr. Rayann Heman on 08/29/2020. Dose is appropriate based on dosing criteria. Will send in refill to requested pharmacy.

## 2021-01-17 ENCOUNTER — Ambulatory Visit (INDEPENDENT_AMBULATORY_CARE_PROVIDER_SITE_OTHER): Payer: Medicare Other

## 2021-01-17 DIAGNOSIS — I480 Paroxysmal atrial fibrillation: Secondary | ICD-10-CM | POA: Diagnosis not present

## 2021-01-17 LAB — CUP PACEART REMOTE DEVICE CHECK
Date Time Interrogation Session: 20220525041231
Implantable Pulse Generator Implant Date: 20190516

## 2021-02-11 NOTE — Progress Notes (Signed)
Carelink Summary Report / Loop Recorder 

## 2021-02-12 DIAGNOSIS — N3021 Other chronic cystitis with hematuria: Secondary | ICD-10-CM | POA: Diagnosis not present

## 2021-02-12 DIAGNOSIS — R3915 Urgency of urination: Secondary | ICD-10-CM | POA: Diagnosis not present

## 2021-02-12 DIAGNOSIS — N8111 Cystocele, midline: Secondary | ICD-10-CM | POA: Diagnosis not present

## 2021-02-16 LAB — CUP PACEART REMOTE DEVICE CHECK
Date Time Interrogation Session: 20220625041548
Implantable Pulse Generator Implant Date: 20190516

## 2021-02-18 ENCOUNTER — Ambulatory Visit (INDEPENDENT_AMBULATORY_CARE_PROVIDER_SITE_OTHER): Payer: Medicare Other

## 2021-02-18 DIAGNOSIS — G459 Transient cerebral ischemic attack, unspecified: Secondary | ICD-10-CM

## 2021-03-08 NOTE — Progress Notes (Signed)
Carelink Summary Report / Loop Recorder 

## 2021-03-21 ENCOUNTER — Ambulatory Visit (INDEPENDENT_AMBULATORY_CARE_PROVIDER_SITE_OTHER): Payer: Medicare Other

## 2021-03-21 DIAGNOSIS — G459 Transient cerebral ischemic attack, unspecified: Secondary | ICD-10-CM | POA: Diagnosis not present

## 2021-03-22 LAB — CUP PACEART REMOTE DEVICE CHECK
Date Time Interrogation Session: 20220726042527
Implantable Pulse Generator Implant Date: 20190516

## 2021-04-04 ENCOUNTER — Encounter: Payer: Self-pay | Admitting: Cardiology

## 2021-04-04 ENCOUNTER — Ambulatory Visit (INDEPENDENT_AMBULATORY_CARE_PROVIDER_SITE_OTHER): Payer: Medicare Other | Admitting: Cardiology

## 2021-04-04 ENCOUNTER — Other Ambulatory Visit: Payer: Self-pay

## 2021-04-04 VITALS — BP 120/60 | HR 49 | Ht 64.5 in | Wt 126.0 lb

## 2021-04-04 DIAGNOSIS — I480 Paroxysmal atrial fibrillation: Secondary | ICD-10-CM

## 2021-04-04 DIAGNOSIS — Z953 Presence of xenogenic heart valve: Secondary | ICD-10-CM

## 2021-04-04 DIAGNOSIS — Z7901 Long term (current) use of anticoagulants: Secondary | ICD-10-CM

## 2021-04-04 DIAGNOSIS — I77810 Thoracic aortic ectasia: Secondary | ICD-10-CM | POA: Diagnosis not present

## 2021-04-04 DIAGNOSIS — Z5181 Encounter for therapeutic drug level monitoring: Secondary | ICD-10-CM | POA: Diagnosis not present

## 2021-04-04 DIAGNOSIS — I2510 Atherosclerotic heart disease of native coronary artery without angina pectoris: Secondary | ICD-10-CM | POA: Diagnosis not present

## 2021-04-04 MED ORDER — AZITHROMYCIN 250 MG PO TABS: ORAL_TABLET | ORAL | 1 refills | Status: DC

## 2021-04-04 NOTE — Assessment & Plan Note (Signed)
23 mm [facility] Ease bovine pericardial tissue valve.  Checking echocardiogram.  Murmur heard on exam.  Dental prophylaxis prescribed.  She requested 6 tablets in total versus 2

## 2021-04-04 NOTE — Assessment & Plan Note (Signed)
I asked Extensively about TIA history.  Searched epic. She has had no recollection of this.  Her MRI of brain was normal.  No prior evidence of stroke.  I will go ahead and take this off of her past medical history.  This does reduce her CHA2DS2-VASc score to 3.

## 2021-04-04 NOTE — Assessment & Plan Note (Signed)
-  2015 left heart catheterization small circumflex with 40% proximal stenosis.  Normal right sided pressures.

## 2021-04-04 NOTE — Patient Instructions (Addendum)
Medication Instructions:  Your physician recommends that you continue on your current medications as directed. Please refer to the Current Medication list given to you today.   We have sent in Azithromycin 250 mg --6 tablets with 1 refill *If you need a refill on your cardiac medications before your next appointment, please call your pharmacy*   Lab Work: NONE If you have labs (blood work) drawn today and your tests are completely normal, you will receive your results only by: [place] (if you have MyChart) OR A paper copy in the mail If you have any lab test that is abnormal or we need to change your treatment, we will call you to review the results.   Testing/Procedures: Your physician has requested that you have an echocardiogram. Echocardiography is a painless test that uses sound waves to create images of your heart. It provides your doctor with information about the size and shape of your heart and how well your heart's chambers and valves are working. This procedure takes approximately one hour. There are no restrictions for this procedure.    Follow-Up: At [facility], you and your health needs are our priority.  As part of our continuing mission to provide you with exceptional heart care, we have created designated Provider Care Teams.  These Care Teams include your primary Cardiologist (physician) and Advanced Practice Providers (APPs -  Physician Assistants and Nurse Practitioners) who all work together to provide you with the care you need, when you need it.  Your next appointment:   1 year(s)  The format for your next appointment:   In Person  Provider:   You may see Candee Furbish, MD or one of the following Advanced Practice Providers on your designated Care Team:   Cecilie Kicks, NP

## 2021-04-04 NOTE — Progress Notes (Signed)
Cardiology Office Note:    Date:  04/04/2021   ID:  Becky Winters, DOB 1940-12-02, MRN [id number]  PCP:  Becky Bowen, MD  [facility] Cardiologist:  Becky Furbish, MD  [facility] Electrophysiologist:  None   Referring MD: Becky Bowen, MD     History of Present Illness:    She is here for a 1 year f/u of congestive heart failure, coronary heart disease and atrial fibrillation.  Becky Winters is a 80 y.o. female post atrial fibrillation ablation 05/19/2019 with Dr. Rayann Winters.  Doing well.  No fevers chills nausea vomiting syncope bleeding.  Seems to be doing quite well.  Walking at home.  Today, she is accompanied by her husband.  She mentions she is feeling well. She does not feel atrial fibrillation when she has them. She is requesting a refill of 250 mg Zithromax and would like more pills.  She denies chest pain, shortness of breath, palpitations, lightheadedness, headaches, syncope, orthopnea, PND.   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 , R knee, degenerative spine ,L hip    Bicuspid aortic valve 05/10/2014   Chronic diastolic congestive heart failure ([place])    Collagenous colitis    Compression, esophagus 08/26/2011   stretched in the past    Encounter for therapeutic drug monitoring 06/15/2014   Exertional dyspnea 05/10/2014   Foot swelling    - LEFT-ever since she had an injury as a child    GERD (gastroesophageal reflux disease)    prevacid on occasion   H/O hiatal hernia    HLD (hyperlipidemia)    Hx of echocardiogram    post AVR >> Echo (12/15):  Mild LVH, EF 55%, AVR ok (Mean gradient (S): 10 mm Hg), mild MR, mild LAE   Hypothyroidism    Orthostatic hypotension 05/10/2014   Postoperative atrial fibrillation ([place]) 06/09/2014   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/07/2014   23 mm [facility] Ease bovine pericardial  tissue valve   Severe aortic stenosis 05/21/2014   s/p AVR 05/2014   UTI (urinary tract infection)    frequent UTI-----pt. reports that she takes Cipro if needed, last UTI- July 2015    Past Surgical History:  Procedure Laterality Date   AORTIC VALVE REPLACEMENT N/A 06/07/2014   Procedure: AORTIC VALVE REPLACEMENT  (AVR) with 24m Aortic Perimount Magna Ease;  Surgeon: CRexene Alberts MD;  [place]  Service: Open Heart Surgery;  Laterality: N/A;   APPENDECTOMY     ATRIAL FIBRILLATION ABLATION N/A 05/19/2019   Procedure: ATRIAL FIBRILLATION ABLATION;  Surgeon: AThompson Grayer MD;  [place];  Service: Cardiovascular;  Laterality: N/A;   BLADDER SURGERY     x2 for tacking   CARDIAC CATHETERIZATION     cardiolite  2003, 2006   CHOLECYSTECTOMY     ESOPHAGEAL DILATION     INTRAOPERATIVE TRANSESOPHAGEAL ECHOCARDIOGRAM N/A 06/07/2014   Procedure: INTRAOPERATIVE TRANSESOPHAGEAL ECHOCARDIOGRAM;  Surgeon: CRexene Alberts MD;  [place]  Service: Open Heart Surgery;  Laterality: N/A;   LEFT AND RIGHT HEART CATHETERIZATION WITH CORONARY ANGIOGRAM N/A 05/25/2014   Procedure: LEFT AND RIGHT HEART CATHETERIZATION WITH CORONARY ANGIOGRAM;  Surgeon: DLarey Dresser MD;  [facility];  Service: Cardiovascular;  Laterality: N/A;   LOOP RECORDER INSERTION N/A 01/07/2018   Procedure: LOOP RECORDER INSERTION;  Surgeon:  Thompson Grayer, MD;  Location: [place] CV LAB;  Service: Cardiovascular;  Laterality: N/A;   NASAL SEPTUM SURGERY  1995   THUMB ARTHROSCOPY Left 2006   ? replacement of ligament    TONSILLECTOMY     TOTAL ABDOMINAL HYSTERECTOMY     PARTIAL    Current Medications: Current Meds  Medication Sig   atorvastatin (LIPITOR) 20 MG tablet Take 20 mg by mouth daily.   ELIQUIS 5 MG TABS tablet TAKE ONE TABLET BY MOUTH TWICE DAILY   levothyroxine (SYNTHROID, LEVOTHROID) 100 MCG tablet Take 100 mcg by mouth daily before breakfast.    [DISCONTINUED]  azithromycin (ZITHROMAX) 250 MG tablet TAKE TWO TABLETS BY MOUTH 30 MINUTES BEFORE DENTAL PROCEDURE     Allergies:   Amiodarone, Clindamycin/lincomycin, Asacol [mesalamine], Codeine, Pentasa [mesalamine er], Sulfa antibiotics, and Amoxicillin   Social History   Socioeconomic History   Marital status: Married    Spouse name: Not on file   Number of children: 2   Years of education: Not on file   Highest education level: Not on file  Occupational History   Not on file  Tobacco Use   Smoking status: Never   Smokeless tobacco: Never  Vaping Use   Vaping Use: Never used  Substance and Sexual Activity   Alcohol use: No   Drug use: No   Sexual activity: Not on file  Other Topics Concern   Not on file  Social History Narrative   Right handed    Lives with husband      Caffeine use: some coffee/soft drinks   Social Determinants of Health   Financial Resource Strain: Not on file  Food Insecurity: Not on file  Transportation Needs: Not on file  Physical Activity: Not on file  Stress: Not on file  Social Connections: Not on file     Family History: The patient's family history includes CAD (age of onset: 64) in her father; CVA in an other family member; Heart attack in her father and paternal grandfather; Stroke in her paternal grandmother. There is no history of Hypertension.  ROS:   Please see the history of present illness.  (+)  LLE edema  All other systems reviewed and are negative.  EKGs/Labs/Other Studies Reviewed:    The following studies were reviewed today:  ECHO 2020  1. The left ventricle has low normal systolic function, with an ejection  fraction of 50-55%. The cavity size was mildly dilated. Left ventricular  diastolic Doppler parameters are indeterminate.   2. Abnormal septal motion.   3. The right ventricle has normal systolic function. The cavity was  normal. There is no increase in right ventricular wall thickness.   4. Left atrial size was mildly  dilated.   5. Right atrial size was mildly dilated.   6. Mild thickening of the mitral valve leaflet.   7. 23 Perimount AVR bioprosthetic Gradients a bit high but stable since  echo done 03/19/17 No PVL.   8. The aorta is abnormal unless otherwise noted.   9. There is mild dilatation of the aortic root measuring 38 mm.   EKG:   04/04/2021: Sinus bradycardia 49 bpm 03/06/2020:  The ekg ordered today demonstrates sinus rhythm 62 no evidence of atrial fibrillation  Recent Labs: 08/29/2020: BUN 25; Creatinine, Ser 1.01; Hemoglobin 14.0; Platelets 182; Potassium 4.3; Sodium 141  Recent Lipid Panel    Component Value Date/Time   CHOL 117 06/15/2017 0845   TRIG 112 06/15/2017 0845   HDL 41 06/15/2017  0845   CHOLHDL 2.9 06/15/2017 0845   CHOLHDL 3.1 03/12/2016 1447   VLDL 27 03/12/2016 1447   LDLCALC 54 06/15/2017 0845    Physical Exam:    VS:  BP 120/60 (BP Location: Left Arm, Patient Position: Sitting, Cuff Size: Normal)   Pulse (!) 49   Ht 5' 4.5" (1.638 m)   Wt 126 lb (57.2 kg)   SpO2 96%   BMI 21.29 kg/m     Wt Readings from Last 3 Encounters:  04/04/21 126 lb (57.2 kg)  08/29/20 135 lb (61.2 kg)  03/06/20 136 lb (61.7 kg)     GEN:  Well nourished, well developed in no acute distress HEENT: Normal NECK: No JVD; No carotid bruits LYMPHATICS: No lymphadenopathy CARDIAC: RRR, 1/6 systolic murmur, rubs, gallops RESPIRATORY:  Clear to auscultation without rales, wheezing or rhonchi  ABDOMEN: Soft, non-tender, non-distended MUSCULOSKELETAL:  LLE foot edema; No deformity  SKIN: Warm and dry NEUROLOGIC:  Alert and oriented x 3 PSYCHIATRIC:  Normal affect   ASSESSMENT:    1. Paroxysmal atrial fibrillation (HCC)   2. S/P aortic valve replacement with bioprosthetic valve   3. Encounter for therapeutic drug monitoring   4. Chronic anticoagulation   5. Coronary artery disease involving native coronary artery of native heart without angina pectoris   6. Dilated aortic root  (HCC)     PLAN:    In order of problems listed above: Coronary artery disease involving native coronary artery of native heart without angina pectoris -2015 left heart catheterization small circumflex with 40% proximal stenosis.  Normal right sided pressures.  S/P aortic valve replacement with bioprosthetic valve 23 mm [facility] Ease bovine pericardial tissue valve.  Checking echocardiogram.  Murmur heard on exam.  Dental prophylaxis prescribed.  She requested 6 tablets in total versus 2  Paroxysmal atrial fibrillation ([place]) -Post ablation 2020 off of antiarrhythmic therapy.  Implantable loop recorder in place.  Dr. Rayann Winters has been monitoring. -Previous stated that she had been short of breath since she was born. -She is doing very well currently.  Sinus rhythm.  She is relieved.  Chronic anticoagulation  I asked Extensively about TIA history.  Searched epic. She has had no recollection of this.  Her MRI of brain was normal.  No prior evidence of stroke.  I will go ahead and take this off of her past medical history.  This does reduce her CHA2DS2-VASc score to 3.     Follow-up in 1 year.  Medication Adjustments/Labs and Tests Ordered: Current medicines are reviewed at length with the patient today.  Concerns regarding medicines are outlined above.  Orders Placed This Encounter  Procedures   EKG 12-Lead   ECHOCARDIOGRAM COMPLETE    Meds ordered this encounter  Medications   azithromycin (ZITHROMAX) 250 MG tablet    Sig: TAKE TWO TABLETS BY MOUTH 30 MINUTES BEFORE DENTAL PROCEDURE    Dispense:  6 tablet    Refill:  1     Patient Instructions  Medication Instructions:  Your physician recommends that you continue on your current medications as directed. Please refer to the Current Medication list given to you today.   We have sent in Azithromycin 250 mg --6 tablets with 1 refill *If you need a refill on your cardiac medications before your next appointment, please call  your pharmacy*   Lab Work: NONE If you have labs (blood work) drawn today and your tests are completely normal, you will receive your results only by: [employer] (if  you have MyChart) OR A paper copy in the mail If you have any lab test that is abnormal or we need to change your treatment, we will call you to review the results.   Testing/Procedures: Your physician has requested that you have an echocardiogram. Echocardiography is a painless test that uses sound waves to create images of your heart. It provides your doctor with information about the size and shape of your heart and how well your heart's chambers and valves are working. This procedure takes approximately one hour. There are no restrictions for this procedure.    Follow-Up: At [facility], you and your health needs are our priority.  As part of our continuing mission to provide you with exceptional heart care, we have created designated Provider Care Teams.  These Care Teams include your primary Cardiologist (physician) and Advanced Practice Providers (APPs -  Physician Assistants and Nurse Practitioners) who all work together to provide you with the care you need, when you need it.  Your next appointment:   1 year(s)  The format for your next appointment:   In Person  Provider:   You may see Becky Furbish, MD or one of the following Advanced Practice Providers on your designated Care Team:   Cecilie Kicks, NP         I,Zite Okoli,acting as a scribe for Becky Furbish, MD.,have documented all relevant documentation on the behalf of Becky Furbish, MD,as directed by  Becky Furbish, MD while in the presence of Becky Furbish, MD.   I, Becky Furbish, MD, have reviewed all documentation for this visit. The documentation on 04/04/21 for the exam, diagnosis, procedures, and orders are all accurate and complete.   Signed, Becky Furbish, MD  04/04/2021 10:34 AM    [facility]

## 2021-04-04 NOTE — Assessment & Plan Note (Signed)
-  Post ablation 2020 off of antiarrhythmic therapy.  Implantable loop recorder in place.  Dr. Rayann Heman has been monitoring. -Previous stated that she had been short of breath since she was born. -She is doing very well currently.  Sinus rhythm.  She is relieved.

## 2021-04-17 NOTE — Progress Notes (Signed)
Carelink Summary Report / Loop Recorder 

## 2021-04-22 ENCOUNTER — Ambulatory Visit (INDEPENDENT_AMBULATORY_CARE_PROVIDER_SITE_OTHER): Payer: Medicare Other

## 2021-04-22 DIAGNOSIS — I480 Paroxysmal atrial fibrillation: Secondary | ICD-10-CM

## 2021-04-23 LAB — CUP PACEART REMOTE DEVICE CHECK
Date Time Interrogation Session: 20220826043452
Implantable Pulse Generator Implant Date: 20190516

## 2021-04-24 ENCOUNTER — Ambulatory Visit (HOSPITAL_COMMUNITY): Payer: Medicare Other | Attending: Cardiology

## 2021-04-24 ENCOUNTER — Other Ambulatory Visit: Payer: Self-pay

## 2021-04-24 DIAGNOSIS — I480 Paroxysmal atrial fibrillation: Secondary | ICD-10-CM | POA: Insufficient documentation

## 2021-04-24 DIAGNOSIS — I2510 Atherosclerotic heart disease of native coronary artery without angina pectoris: Secondary | ICD-10-CM | POA: Diagnosis not present

## 2021-04-24 LAB — ECHOCARDIOGRAM COMPLETE
AR max vel: 1.19 cm2
AV Area VTI: 1.03 cm2
AV Area mean vel: 1.13 cm2
AV Mean grad: 20 mmHg
AV Peak grad: 36.4 mmHg
Ao pk vel: 3.02 m/s
Area-P 1/2: 1.66 cm2
S' Lateral: 2.75 cm

## 2021-04-26 ENCOUNTER — Telehealth: Payer: Self-pay | Admitting: *Deleted

## 2021-04-26 DIAGNOSIS — Z953 Presence of xenogenic heart valve: Secondary | ICD-10-CM

## 2021-04-26 DIAGNOSIS — I77810 Thoracic aortic ectasia: Secondary | ICD-10-CM

## 2021-04-26 NOTE — Telephone Encounter (Signed)
Replaced aortic valve has gotten tighter (murmur heard on exam). Let's keep an eye on this with repeat ECHO in one year.  Written by Jerline Pain, MD on 04/24/2021  8:55 PM EDT  Order placed for repeat echo in 1 yr.

## 2021-05-03 NOTE — Progress Notes (Signed)
Carelink Summary Report / Loop Recorder 

## 2021-05-16 DIAGNOSIS — Z23 Encounter for immunization: Secondary | ICD-10-CM | POA: Diagnosis not present

## 2021-05-22 LAB — CUP PACEART REMOTE DEVICE CHECK
Date Time Interrogation Session: 20220926043607
Implantable Pulse Generator Implant Date: 20190516

## 2021-05-23 ENCOUNTER — Ambulatory Visit (INDEPENDENT_AMBULATORY_CARE_PROVIDER_SITE_OTHER): Payer: Medicare Other

## 2021-05-23 DIAGNOSIS — I495 Sick sinus syndrome: Secondary | ICD-10-CM | POA: Diagnosis not present

## 2021-05-31 NOTE — Progress Notes (Signed)
Carelink Summary Report / Loop Recorder 

## 2021-06-05 DIAGNOSIS — D649 Anemia, unspecified: Secondary | ICD-10-CM | POA: Diagnosis not present

## 2021-06-05 DIAGNOSIS — R634 Abnormal weight loss: Secondary | ICD-10-CM | POA: Diagnosis not present

## 2021-06-05 DIAGNOSIS — K5289 Other specified noninfective gastroenteritis and colitis: Secondary | ICD-10-CM | POA: Diagnosis not present

## 2021-06-05 DIAGNOSIS — R197 Diarrhea, unspecified: Secondary | ICD-10-CM | POA: Diagnosis not present

## 2021-06-06 DIAGNOSIS — R197 Diarrhea, unspecified: Secondary | ICD-10-CM | POA: Diagnosis not present

## 2021-06-14 DIAGNOSIS — Z1339 Encounter for screening examination for other mental health and behavioral disorders: Secondary | ICD-10-CM | POA: Diagnosis not present

## 2021-06-14 DIAGNOSIS — I6523 Occlusion and stenosis of bilateral carotid arteries: Secondary | ICD-10-CM | POA: Diagnosis not present

## 2021-06-14 DIAGNOSIS — N1831 Chronic kidney disease, stage 3a: Secondary | ICD-10-CM | POA: Diagnosis not present

## 2021-06-14 DIAGNOSIS — K529 Noninfective gastroenteritis and colitis, unspecified: Secondary | ICD-10-CM | POA: Diagnosis not present

## 2021-06-14 DIAGNOSIS — I700 Atherosclerosis of aorta: Secondary | ICD-10-CM | POA: Diagnosis not present

## 2021-06-14 DIAGNOSIS — G459 Transient cerebral ischemic attack, unspecified: Secondary | ICD-10-CM | POA: Diagnosis not present

## 2021-06-14 DIAGNOSIS — I2510 Atherosclerotic heart disease of native coronary artery without angina pectoris: Secondary | ICD-10-CM | POA: Diagnosis not present

## 2021-06-14 DIAGNOSIS — Z7901 Long term (current) use of anticoagulants: Secondary | ICD-10-CM | POA: Diagnosis not present

## 2021-06-14 DIAGNOSIS — I480 Paroxysmal atrial fibrillation: Secondary | ICD-10-CM | POA: Diagnosis not present

## 2021-06-14 DIAGNOSIS — I679 Cerebrovascular disease, unspecified: Secondary | ICD-10-CM | POA: Diagnosis not present

## 2021-06-14 DIAGNOSIS — Z952 Presence of prosthetic heart valve: Secondary | ICD-10-CM | POA: Diagnosis not present

## 2021-06-20 LAB — CUP PACEART REMOTE DEVICE CHECK
Date Time Interrogation Session: 20221027044026
Implantable Pulse Generator Implant Date: 20190516

## 2021-06-24 ENCOUNTER — Ambulatory Visit (INDEPENDENT_AMBULATORY_CARE_PROVIDER_SITE_OTHER): Payer: Medicare Other

## 2021-06-24 DIAGNOSIS — I495 Sick sinus syndrome: Secondary | ICD-10-CM

## 2021-07-02 NOTE — Progress Notes (Signed)
Carelink Summary Report / Loop Recorder 

## 2021-07-09 DIAGNOSIS — Z961 Presence of intraocular lens: Secondary | ICD-10-CM | POA: Diagnosis not present

## 2021-07-09 DIAGNOSIS — H52203 Unspecified astigmatism, bilateral: Secondary | ICD-10-CM | POA: Diagnosis not present

## 2021-07-21 LAB — CUP PACEART REMOTE DEVICE CHECK
Date Time Interrogation Session: 20221127034503
Implantable Pulse Generator Implant Date: 20190516

## 2021-07-23 ENCOUNTER — Telehealth: Payer: Self-pay | Admitting: *Deleted

## 2021-07-23 ENCOUNTER — Other Ambulatory Visit: Payer: Self-pay | Admitting: Physician Assistant

## 2021-07-23 DIAGNOSIS — D509 Iron deficiency anemia, unspecified: Secondary | ICD-10-CM | POA: Diagnosis not present

## 2021-07-23 DIAGNOSIS — R1032 Left lower quadrant pain: Secondary | ICD-10-CM | POA: Diagnosis not present

## 2021-07-23 DIAGNOSIS — K648 Other hemorrhoids: Secondary | ICD-10-CM | POA: Diagnosis not present

## 2021-07-23 DIAGNOSIS — K5900 Constipation, unspecified: Secondary | ICD-10-CM | POA: Diagnosis not present

## 2021-07-23 DIAGNOSIS — K5289 Other specified noninfective gastroenteritis and colitis: Secondary | ICD-10-CM | POA: Diagnosis not present

## 2021-07-23 NOTE — Telephone Encounter (Signed)
   [facility] Pre-operative Risk Assessment    Patient Name: KHRYSTYNE ARPIN  DOB: 1941/03/19 MRN: [id number]  HEARTCARE STAFF:  - IMPORTANT!!!!!! Under Visit Info/Reason for Call, type in Other and utilize the format Clearance MM/DD/YY or Clearance TBD. Do not use dashes or single digits. - Please review there is not already an duplicate clearance open for this procedure. - If request is for dental extraction, please clarify the # of teeth to be extracted. - If the patient is currently at the dentist's office, call Pre-Op Callback Staff (MA/nurse) to input urgent request.  - If the patient is not currently in the dentist office, please route to the Pre-Op pool.  Request for surgical clearance:  What type of surgery is being performed?  COLONOSCOPY / ENDOSCOPY  When is this surgery scheduled?  TBD  What type of clearance is required (medical clearance vs. Pharmacy clearance to hold med vs. Both)?  BOTH  Are there any medications that need to be held prior to surgery and how long?  [place] name and name of physician performing surgery?  [facility] / Deliah Goody, PA  What is the office phone number?  [phone]   7.   What is the office fax number?  [phone]  8.   Anesthesia type (None, local, MAC, general) ?  PROPOFOL   Jeanann Lewandowsky 07/23/2021, 2:16 PM  _________________________________________________________________   (provider comments below)

## 2021-07-24 NOTE — Telephone Encounter (Signed)
Patient with diagnosis of afib on Eliquis for anticoagulation.    Procedure: colonoscopy/endoscopy Date of procedure: TBD  CHA2DS2-VASc Score = 5  This indicates a 7.2% annual risk of stroke. The patient's score is based upon: CHF History: 1 HTN History: 0 Diabetes History: 0 Stroke History: 0 Vascular Disease History: 1 Age Score: 2 Gender Score: 1   CrCl 57mL/min Platelet count 182K  Per office protocol, patient can hold Eliquis for 1-2 days prior to procedure.

## 2021-07-24 NOTE — Telephone Encounter (Signed)
Pharmacy, can you please comment on how long Eliquis can be held for upcoming procedure?  Thank you! 

## 2021-07-25 ENCOUNTER — Ambulatory Visit (INDEPENDENT_AMBULATORY_CARE_PROVIDER_SITE_OTHER): Payer: Medicare Other

## 2021-07-25 DIAGNOSIS — I495 Sick sinus syndrome: Secondary | ICD-10-CM

## 2021-07-25 NOTE — Telephone Encounter (Signed)
    Patient Name: Becky Winters  DOB: [birth year] MRN: [id number]  Primary Cardiologist: Candee Furbish, MD  Chart reviewed as part of pre-operative protocol coverage. Patient was last seen by Dr. Marlou Porch in 03/2021 at which time she was doing well from a cardiac standpoint. Patient was contacted today for further pre-op evaluation and reported doing well from a cardiac standpoint since last visit. She denied any chest pain, shortness of breath, orthopnea/PND, palpitations, syncope. She is able to complete >4.0 METS without any problems. Given past medical history and time since last visit, based on ACC/AHA guidelines, SERIN THORNELL would be at acceptable risk for the planned procedure without further cardiovascular testing.   Per Pharmacy and office protocol, patient can hold Eliquis for 1-2 days prior to procedure. Please restart this as soon as safely possible after procedure.   I will route this recommendation to the requesting party via Epic fax function and remove from pre-op pool.  Please call with questions.  Darreld Mclean, PA-C 07/25/2021, 8:37 AM

## 2021-07-25 NOTE — Telephone Encounter (Signed)
Left message to call back and ask to speak with pre-op team.  Darreld Mclean, PA-C 07/25/2021 8:24 AM

## 2021-08-01 DIAGNOSIS — Z23 Encounter for immunization: Secondary | ICD-10-CM | POA: Diagnosis not present

## 2021-08-06 NOTE — Progress Notes (Signed)
Carelink Summary Report / Loop Recorder 

## 2021-08-07 ENCOUNTER — Other Ambulatory Visit: Payer: Self-pay

## 2021-08-07 ENCOUNTER — Ambulatory Visit
Admission: RE | Admit: 2021-08-07 | Discharge: 2021-08-07 | Disposition: A | Discharge status: Home / Self Care | Payer: Medicare Other | Source: Ambulatory Visit | Attending: Physician Assistant | Admitting: Physician Assistant

## 2021-08-07 DIAGNOSIS — R634 Abnormal weight loss: Secondary | ICD-10-CM | POA: Diagnosis not present

## 2021-08-07 DIAGNOSIS — K5900 Constipation, unspecified: Secondary | ICD-10-CM | POA: Diagnosis not present

## 2021-08-07 DIAGNOSIS — K449 Diaphragmatic hernia without obstruction or gangrene: Secondary | ICD-10-CM | POA: Diagnosis not present

## 2021-08-07 DIAGNOSIS — R197 Diarrhea, unspecified: Secondary | ICD-10-CM | POA: Diagnosis not present

## 2021-08-07 DIAGNOSIS — R1032 Left lower quadrant pain: Secondary | ICD-10-CM

## 2021-08-07 MED ORDER — IOPAMIDOL (ISOVUE-300) INJECTION 61%
100.0000 mL | Freq: Once | INTRAVENOUS | Status: AC | PRN
  Administered 2021-08-07: 14:00:00 100 mL via INTRAVENOUS

## 2021-08-27 ENCOUNTER — Ambulatory Visit (INDEPENDENT_AMBULATORY_CARE_PROVIDER_SITE_OTHER): Payer: Medicare Other

## 2021-08-27 DIAGNOSIS — K648 Other hemorrhoids: Secondary | ICD-10-CM | POA: Diagnosis not present

## 2021-08-27 DIAGNOSIS — K5900 Constipation, unspecified: Secondary | ICD-10-CM | POA: Diagnosis not present

## 2021-08-27 DIAGNOSIS — Z1211 Encounter for screening for malignant neoplasm of colon: Secondary | ICD-10-CM | POA: Diagnosis not present

## 2021-08-27 DIAGNOSIS — K529 Noninfective gastroenteritis and colitis, unspecified: Secondary | ICD-10-CM | POA: Diagnosis not present

## 2021-08-27 DIAGNOSIS — I495 Sick sinus syndrome: Secondary | ICD-10-CM

## 2021-08-27 DIAGNOSIS — D509 Iron deficiency anemia, unspecified: Secondary | ICD-10-CM | POA: Diagnosis not present

## 2021-08-27 LAB — CUP PACEART REMOTE DEVICE CHECK
Date Time Interrogation Session: 20221231230705
Implantable Pulse Generator Implant Date: 20190516

## 2021-09-06 NOTE — Progress Notes (Signed)
Carelink Summary Report / Loop Recorder 

## 2021-09-26 ENCOUNTER — Ambulatory Visit (INDEPENDENT_AMBULATORY_CARE_PROVIDER_SITE_OTHER): Payer: Medicare Other

## 2021-09-26 DIAGNOSIS — I495 Sick sinus syndrome: Secondary | ICD-10-CM

## 2021-09-26 LAB — CUP PACEART REMOTE DEVICE CHECK
Date Time Interrogation Session: 20230201232501
Implantable Pulse Generator Implant Date: 20190516

## 2021-10-02 ENCOUNTER — Telehealth: Payer: Self-pay

## 2021-10-02 NOTE — Progress Notes (Signed)
Carelink Summary Report / Loop Recorder 

## 2021-10-02 NOTE — Telephone Encounter (Signed)
ILR reached RRT 09/30/21.   Patient called and made aware. Options discussed to leave in or explant.  Patient voiced she would like to the device explanted d/t discomfort at her bra line. Advised I will forward her info to scheduling and someone will call to set up apt with EP provider. Voiced understanding.

## 2021-10-14 ENCOUNTER — Other Ambulatory Visit: Payer: Self-pay | Admitting: Internal Medicine

## 2021-10-14 DIAGNOSIS — I480 Paroxysmal atrial fibrillation: Secondary | ICD-10-CM

## 2021-10-14 NOTE — Telephone Encounter (Signed)
Eliquis 5mg  refill request received. Patient is 82 years old, weight-57.2kg, Crea-0.87 via [place] from [place] on 07/23/2021, Diagnosis-Afib, and last seen by Dr. Marlou Porch on 04/04/2021. Dose is appropriate based on dosing criteria. Will send in refill to requested pharmacy.

## 2021-10-22 DIAGNOSIS — Z1231 Encounter for screening mammogram for malignant neoplasm of breast: Secondary | ICD-10-CM | POA: Diagnosis not present

## 2021-11-14 ENCOUNTER — Encounter: Payer: Medicare Other | Admitting: Internal Medicine

## 2021-12-09 DIAGNOSIS — E039 Hypothyroidism, unspecified: Secondary | ICD-10-CM | POA: Diagnosis not present

## 2021-12-09 DIAGNOSIS — I2510 Atherosclerotic heart disease of native coronary artery without angina pectoris: Secondary | ICD-10-CM | POA: Diagnosis not present

## 2021-12-09 DIAGNOSIS — E785 Hyperlipidemia, unspecified: Secondary | ICD-10-CM | POA: Diagnosis not present

## 2021-12-09 DIAGNOSIS — E559 Vitamin D deficiency, unspecified: Secondary | ICD-10-CM | POA: Diagnosis not present

## 2021-12-16 DIAGNOSIS — Z7901 Long term (current) use of anticoagulants: Secondary | ICD-10-CM | POA: Diagnosis not present

## 2021-12-16 DIAGNOSIS — Z1331 Encounter for screening for depression: Secondary | ICD-10-CM | POA: Diagnosis not present

## 2021-12-16 DIAGNOSIS — E039 Hypothyroidism, unspecified: Secondary | ICD-10-CM | POA: Diagnosis not present

## 2021-12-16 DIAGNOSIS — I700 Atherosclerosis of aorta: Secondary | ICD-10-CM | POA: Diagnosis not present

## 2021-12-16 DIAGNOSIS — Z1339 Encounter for screening examination for other mental health and behavioral disorders: Secondary | ICD-10-CM | POA: Diagnosis not present

## 2021-12-16 DIAGNOSIS — M5090 Cervical disc disorder, unspecified, unspecified cervical region: Secondary | ICD-10-CM | POA: Diagnosis not present

## 2021-12-16 DIAGNOSIS — E785 Hyperlipidemia, unspecified: Secondary | ICD-10-CM | POA: Diagnosis not present

## 2021-12-16 DIAGNOSIS — K529 Noninfective gastroenteritis and colitis, unspecified: Secondary | ICD-10-CM | POA: Diagnosis not present

## 2021-12-16 DIAGNOSIS — R82998 Other abnormal findings in urine: Secondary | ICD-10-CM | POA: Diagnosis not present

## 2021-12-16 DIAGNOSIS — I679 Cerebrovascular disease, unspecified: Secondary | ICD-10-CM | POA: Diagnosis not present

## 2021-12-16 DIAGNOSIS — Z0000 Encounter for general adult medical examination without abnormal findings: Secondary | ICD-10-CM | POA: Diagnosis not present

## 2021-12-16 DIAGNOSIS — I2510 Atherosclerotic heart disease of native coronary artery without angina pectoris: Secondary | ICD-10-CM | POA: Diagnosis not present

## 2021-12-16 DIAGNOSIS — I480 Paroxysmal atrial fibrillation: Secondary | ICD-10-CM | POA: Diagnosis not present

## 2022-01-15 ENCOUNTER — Encounter: Payer: Self-pay | Admitting: Internal Medicine

## 2022-01-15 ENCOUNTER — Ambulatory Visit (INDEPENDENT_AMBULATORY_CARE_PROVIDER_SITE_OTHER): Payer: Medicare Other | Admitting: Internal Medicine

## 2022-01-15 VITALS — BP 132/70 | HR 54 | Ht 64.5 in | Wt 123.4 lb

## 2022-01-15 DIAGNOSIS — I480 Paroxysmal atrial fibrillation: Secondary | ICD-10-CM | POA: Diagnosis not present

## 2022-01-15 DIAGNOSIS — D6869 Other thrombophilia: Secondary | ICD-10-CM | POA: Diagnosis not present

## 2022-01-15 HISTORY — PX: OTHER SURGICAL HISTORY: SHX169

## 2022-01-15 NOTE — Progress Notes (Signed)
PCP: Reynold Bowen, MD Primary Cardiologist: Dr Marlou Porch Primary EP: Dr Rolanda Lundborg is a 81 y.o. female who presents today for routine electrophysiology followup.  Since last being seen in our clinic, the patient reports doing very well.  SOB is stable.  Today, she denies symptoms of palpitations, chest pain,   lower extremity edema, dizziness, presyncope, or syncope.  The patient is otherwise without complaint today.   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 , R knee, degenerative spine ,L hip    Bicuspid aortic valve 05/10/2014   Chronic diastolic congestive heart failure ([place])    Collagenous colitis    Compression, esophagus 08/26/2011   stretched in the past    Encounter for therapeutic drug monitoring 06/15/2014   Exertional dyspnea 05/10/2014   Foot swelling    - LEFT-ever since she had an injury as a child    GERD (gastroesophageal reflux disease)    prevacid on occasion   H/O hiatal hernia    HLD (hyperlipidemia)    Hx of echocardiogram    post AVR >> Echo (12/15):  Mild LVH, EF 55%, AVR ok (Mean gradient (S): 10 mm Hg), mild MR, mild LAE   Hypothyroidism    Orthostatic hypotension 05/10/2014   Postoperative atrial fibrillation ([place]) 06/09/2014   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 reports that she takes Cipro if needed, last UTI- July 2015   Past Surgical History:  Procedure Laterality Date   AORTIC VALVE REPLACEMENT N/A 06/07/2014   Procedure: AORTIC VALVE REPLACEMENT  (AVR) with 18m Aortic Perimount Magna Ease;  Surgeon: CRexene Alberts MD;  [place]  Service: Open Heart Surgery;  Laterality: N/A;   APPENDECTOMY     ATRIAL FIBRILLATION  ABLATION N/A 05/19/2019   Procedure: ATRIAL FIBRILLATION ABLATION;  Surgeon: AThompson Grayer MD;  [place];  Service: Cardiovascular;  Laterality: N/A;   BLADDER SURGERY     x2 for tacking   CARDIAC CATHETERIZATION     cardiolite  2003, 2006   CHOLECYSTECTOMY     ESOPHAGEAL DILATION     INTRAOPERATIVE TRANSESOPHAGEAL ECHOCARDIOGRAM N/A 06/07/2014   Procedure: INTRAOPERATIVE TRANSESOPHAGEAL ECHOCARDIOGRAM;  Surgeon: CRexene Alberts MD;  [place]  Service: Open Heart Surgery;  Laterality: N/A;   LEFT AND RIGHT HEART CATHETERIZATION WITH CORONARY ANGIOGRAM N/A 05/25/2014   Procedure: LEFT AND RIGHT HEART CATHETERIZATION WITH CORONARY ANGIOGRAM;  Surgeon: DLarey Dresser MD;  [facility];  Service: Cardiovascular;  Laterality: N/A;   LOOP RECORDER INSERTION N/A 01/07/2018   Procedure: LOOP RECORDER INSERTION;  Surgeon: AThompson Grayer MD;  [place] [facility];  Service: Cardiovascular;  Laterality: N/A;   NASAL SEPTUM SURGERY  1995   THUMB ARTHROSCOPY Left 2006   ? replacement of ligament    TONSILLECTOMY     TOTAL ABDOMINAL HYSTERECTOMY     PARTIAL    ROS- all systems are reviewed and negatives except as per HPI above  Current Outpatient Medications  Medication Sig Dispense Refill   apixaban (ELIQUIS) 5 MG TABS tablet TAKE ONE TABLET BY MOUTH TWICE DAILY 180 tablet 1   atorvastatin (LIPITOR)  20 MG tablet Take 20 mg by mouth daily.     azithromycin (ZITHROMAX) 250 MG tablet TAKE TWO TABLETS BY MOUTH 30 MINUTES BEFORE DENTAL PROCEDURE 6 tablet 1   levothyroxine (SYNTHROID) 112 MCG tablet Take 1 tablet by mouth daily.     RESTASIS 0.05 % ophthalmic emulsion Place 1 drop into both eyes in the morning and at bedtime.     No current facility-administered medications for this visit.    Physical Exam: Vitals:   01/15/22 1152  BP: 132/70  Pulse: (!) 54  SpO2: 97%  Weight: 123 lb 6.4 oz (56 kg)  Height: 5' 4.5" (1.638 m)    GEN- The patient  is well appearing, alert and oriented x 3 today.   Head- normocephalic, atraumatic Eyes-  Sclera clear, conjunctiva pink Ears- hearing intact Oropharynx- clear Lungs- Clear to ausculation bilaterally, normal work of breathing Heart- Regular rate and rhythm, no murmurs, rubs or gallops, PMI not laterally displaced GI- soft, NT, ND, + BS Extremities- no clubbing, cyanosis, or edema  Wt Readings from Last 3 Encounters:  01/15/22 123 lb 6.4 oz (56 kg)  04/04/21 126 lb (57.2 kg)  08/29/20 135 lb (61.2 kg)    EKG tracing ordered today is personally reviewed and shows sinus bradycardia 54 bpm  Assessment and Plan:  Paroxysmal atrial fibrillation/ atrial flutter AF is stable her ILR is at RRT We discussed options at length today.  She would like to have this removed without a new device implanted.  I think that this is an appropriate approach. She is on eliquis  2. S/p AVR (Bioprosthetic) Stable No change required today  3. Chronic diastolic dysfunction Stable No change required today   Risks, benefits and potential toxicities for medications prescribed and/or refilled reviewed with patient today.   Follow-up in AF clinic in a year  Thompson Grayer MD, [facility] 01/15/2022 12:04 PM   PROCEDURES:   1. Implantable loop recorder explantation       DESCRIPTION OF PROCEDURE:  Informed written consent was obtained.  The patient required no sedation for the procedure today.   The patients left chest was therefore prepped and draped in the usual sterile fashion.  The skin overlying the ILR monitor was infiltrated with lidocaine for local analgesia.  A 0.5-cm incision was made over the site.  The previously implanted ILR was exposed and removed using a combination of sharp and blunt dissection.  Steri- Strips and a sterile dressing were then applied. EBL<76m.  There were no early apparent complications.     CONCLUSIONS:   1. Successful explantation of a [employer] Reveal LINQ implantable loop  recorder   2. No early apparent complications.     Return to AF clinic in a year    JThompson GrayerMD, [facility]5/24/2023 12:11 PM

## 2022-01-15 NOTE — Patient Instructions (Addendum)
Medication Instructions:  Your physician recommends that you continue on your current medications as directed. Please refer to the Current Medication list given to you today.  *If you need a refill on your cardiac medications before your next appointment, please call your pharmacy*   Lab Work: None ordered   Testing/Procedures: None ordered   Follow-Up: At [facility], you and your health needs are our priority.  As part of our continuing mission to provide you with exceptional heart care, we have created designated Provider Care Teams.  These Care Teams include your primary Cardiologist (physician) and Advanced Practice Providers (APPs -  Physician Assistants and Nurse Practitioners) who all work together to provide you with the care you need, when you need it.   Your next appointment:   1 year(s)  The format for your next appointment:   In Person  Provider:   You will follow up in the [facility] located at [facility]. Your provider will be: Roderic Palau, NP or Clint R. Fenton, PA-C    Thank you for choosing [facility]!!   [phone]  Other Instructions    Important Information About Sugar       Implantable Loop Recorder Removal, Care After This sheet gives you information about how to care for yourself after your procedure. Your health care provider may also give you more specific instructions. If you have problems or questions, contact your health care provider. What can I expect after the procedure? After the procedure, it is common to have: Soreness or discomfort near the incision. Some swelling or bruising near the incision.  Follow these instructions at home: Incision care  Monitor your cardiac device site for redness, swelling, and drainage. Call the device clinic at [phone] if you experience these symptoms or fever/chills.  Keep the large square bandage on your site for 24 hours and then you may remove it  yourself. Keep the steri-strips underneath in place.   You may shower after 24 hours with the steri-strips in place. They will usually fall off on their own, or may be removed after 10 days. Pat dry.   Avoid lotions, ointments, or perfumes over your incision until it is well-healed.  Please do not submerge in water until your site is completely healed.   If your wound site starts to bleed apply pressure.      If you have any questions/concerns please call the device clinic at [phone].  Activity  Return to your normal activities.  Contact a health care provider if: You have redness, swelling, or pain around your incision. You have a fever.

## 2022-02-12 DIAGNOSIS — N3021 Other chronic cystitis with hematuria: Secondary | ICD-10-CM | POA: Diagnosis not present

## 2022-02-12 DIAGNOSIS — N3941 Urge incontinence: Secondary | ICD-10-CM | POA: Diagnosis not present

## 2022-03-25 ENCOUNTER — Ambulatory Visit (HOSPITAL_COMMUNITY): Payer: Medicare Other | Attending: Cardiovascular Disease

## 2022-03-25 DIAGNOSIS — I350 Nonrheumatic aortic (valve) stenosis: Secondary | ICD-10-CM | POA: Diagnosis not present

## 2022-03-25 DIAGNOSIS — I77810 Thoracic aortic ectasia: Secondary | ICD-10-CM | POA: Diagnosis not present

## 2022-03-25 DIAGNOSIS — Z953 Presence of xenogenic heart valve: Secondary | ICD-10-CM | POA: Diagnosis not present

## 2022-03-25 LAB — ECHOCARDIOGRAM COMPLETE
AR max vel: 0.9 cm2
AV Area VTI: 0.92 cm2
AV Area mean vel: 0.92 cm2
AV Mean grad: 23 mmHg
AV Peak grad: 37.9 mmHg
Ao pk vel: 3.08 m/s
Area-P 1/2: 3.01 cm2
P 1/2 time: 721 msec
S' Lateral: 2.8 cm

## 2022-03-29 DIAGNOSIS — M25532 Pain in left wrist: Secondary | ICD-10-CM | POA: Diagnosis not present

## 2022-04-01 ENCOUNTER — Ambulatory Visit (INDEPENDENT_AMBULATORY_CARE_PROVIDER_SITE_OTHER): Payer: Medicare Other | Admitting: Cardiology

## 2022-04-01 ENCOUNTER — Encounter: Payer: Self-pay | Admitting: Cardiology

## 2022-04-01 VITALS — BP 90/50 | HR 57 | Ht 64.5 in | Wt 124.0 lb

## 2022-04-01 DIAGNOSIS — Z953 Presence of xenogenic heart valve: Secondary | ICD-10-CM

## 2022-04-01 DIAGNOSIS — I77810 Thoracic aortic ectasia: Secondary | ICD-10-CM

## 2022-04-01 DIAGNOSIS — I480 Paroxysmal atrial fibrillation: Secondary | ICD-10-CM

## 2022-04-01 DIAGNOSIS — Z7901 Long term (current) use of anticoagulants: Secondary | ICD-10-CM | POA: Diagnosis not present

## 2022-04-01 NOTE — Patient Instructions (Signed)

## 2022-04-01 NOTE — Progress Notes (Signed)
Cardiology Office Note:    Date:  04/01/2022   ID:  Becky Winters, DOB 09/21/1940, MRN [id number]  PCP:  Reynold Bowen, MD   [facility] Providers Cardiologist:  Candee Furbish, MD     Referring MD: Reynold Bowen, MD    History of Present Illness:    Becky Winters is a 81 y.o. female here for the follow-up of paroxysmal atrial fibrillation post ablation in 2020 with Dr. Rayann Heman.  Had aortic valve replacement 2015 23 mm [facility] Ease bovine pericardial tissue valve secondary to bicuspid aortic valve.  Had loop recorder inserted in 2019.  It has subsequently been removed.  Hurt left hand, tried to pick up rocking chair.  Seeing Dr. Amedeo Plenty.  No syncope, no dizzy. BP is low normally.  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 , R knee, degenerative spine ,L hip    Bicuspid aortic valve 05/10/2014   Chronic diastolic congestive heart failure ([place])    Collagenous colitis    Compression, esophagus 08/26/2011   stretched in the past    Encounter for therapeutic drug monitoring 06/15/2014   Exertional dyspnea 05/10/2014   Foot swelling    - LEFT-ever since she had an injury as a child    GERD (gastroesophageal reflux disease)    prevacid on occasion   H/O hiatal hernia    HLD (hyperlipidemia)    Hx of echocardiogram    post AVR >> Echo (12/15):  Mild LVH, EF 55%, AVR ok (Mean gradient (S): 10 mm Hg), mild MR, mild LAE   Hypothyroidism    Orthostatic hypotension 05/10/2014   Postoperative atrial fibrillation ([place]) 06/09/2014   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 reports that she takes Cipro if needed, last UTI- July 2015    Past Surgical History:  Procedure  Laterality Date   AORTIC VALVE REPLACEMENT N/A 06/07/2014   Procedure: AORTIC VALVE REPLACEMENT  (AVR) with 78m Aortic Perimount Magna Ease;  Surgeon: CRexene Alberts MD;  [place]  Service: Open Heart Surgery;  Laterality: N/A;   APPENDECTOMY     ATRIAL FIBRILLATION ABLATION N/A 05/19/2019   Procedure: ATRIAL FIBRILLATION ABLATION;  Surgeon: AThompson Grayer MD;  [place];  Service: Cardiovascular;  Laterality: N/A;   BLADDER SURGERY     x2 for tacking   CARDIAC CATHETERIZATION     cardiolite  2003, 2006   CHOLECYSTECTOMY     ESOPHAGEAL DILATION     implantable loop recorder removal  01/15/2022   MDT Reveal LINQ removed   INTRAOPERATIVE TRANSESOPHAGEAL ECHOCARDIOGRAM N/A 06/07/2014   Procedure: INTRAOPERATIVE TRANSESOPHAGEAL ECHOCARDIOGRAM;  Surgeon: CRexene Alberts MD;  [place]  Service: Open Heart Surgery;  Laterality: N/A;   LEFT AND RIGHT HEART CATHETERIZATION WITH CORONARY ANGIOGRAM N/A 05/25/2014   Procedure: LEFT AND RIGHT HEART CATHETERIZATION WITH CORONARY ANGIOGRAM;  Surgeon: DLarey Dresser MD;  [facility];  Service: Cardiovascular;  Laterality: N/A;   LOOP RECORDER INSERTION N/A 01/07/2018   Procedure: LOOP RECORDER INSERTION;  Surgeon: AThompson Grayer MD;  [place];  Service: Cardiovascular;  Laterality: N/A;   NASAL SEPTUM SURGERY  08/25/1993   THUMB ARTHROSCOPY Left 08/25/2004   ? replacement of ligament    TONSILLECTOMY     TOTAL ABDOMINAL HYSTERECTOMY     PARTIAL    Current Medications: Current Meds  Medication Sig   apixaban (ELIQUIS) 5 MG TABS tablet TAKE ONE TABLET BY MOUTH TWICE DAILY   atorvastatin (LIPITOR) 20 MG tablet Take 20 mg by mouth daily.   azithromycin (ZITHROMAX) 250 MG tablet TAKE TWO TABLETS BY MOUTH 30 MINUTES BEFORE DENTAL PROCEDURE   levothyroxine (SYNTHROID) 112 MCG tablet Take 1 tablet by mouth daily.   RESTASIS 0.05 % ophthalmic emulsion Place 1 drop into both eyes in the  morning and at bedtime.     Allergies:   Amiodarone, Clindamycin/lincomycin, Asacol [mesalamine], Codeine, Pentasa [mesalamine er], Sulfa antibiotics, Tramadol, and Amoxicillin   Social History   Socioeconomic History   Marital status: Married    Spouse name: Not on file   Number of children: 2   Years of education: Not on file   Highest education level: Not on file  Occupational History   Not on file  Tobacco Use   Smoking status: Never   Smokeless tobacco: Never  Vaping Use   Vaping Use: Never used  Substance and Sexual Activity   Alcohol use: No   Drug use: No   Sexual activity: Not on file  Other Topics Concern   Not on file  Social History Narrative   Right handed    Lives with husband      Caffeine use: some coffee/soft drinks   Social Determinants of Health   Financial Resource Strain: Not on file  Food Insecurity: Not on file  Transportation Needs: Not on file  Physical Activity: Not on file  Stress: Not on file  Social Connections: Not on file     Family History: The patient's family history includes CAD (age of onset: 1) in her father; CVA in an other family member; Heart attack in her father and paternal grandfather; Stroke in her paternal grandmother. There is no history of Hypertension.  ROS:   Please see the history of present illness.     All other systems reviewed and are negative.  EKGs/Labs/Other Studies Reviewed:    The following studies were reviewed today: Echocardiogram 03/25/2022:   1. Left ventricular ejection fraction, by estimation, is 60 to 65%. The  left ventricle has normal function. The left ventricle has no regional  wall motion abnormalities. Left ventricular diastolic parameters were  normal.   2. Right ventricular systolic function is normal. The right ventricular  size is normal. There is normal pulmonary artery systolic pressure.   3. Right atrial size was mildly dilated.   4. The mitral valve is degenerative. Trivial  mitral valve regurgitation.  No evidence of mitral stenosis. There is mild holosystolic prolapse of  both leaflets of the mitral valve.   5. Compared with the echo 03/2021, mean prosthetic valve gradient has  increased from 20 mmHg to 23 mmHg. . The aortic valve has been  repaired/replaced. Aortic valve regurgitation is trivial. Moderate aortic  valve stenosis. There is a 23 mm Perimount  bioprosthetic valve present in the aortic position. Echo findings are  consistent with stenosis of the aortic prosthesis. Aortic valve area, by  VTI measures 0.92 cm. Aortic valve mean gradient measures 23.0 mmHg.  Aortic valve Vmax measures 3.08 m/s.   6. The inferior vena cava is normal in size with greater than 50%  respiratory variability, suggesting right atrial pressure of  3 mmHg.   EKG:  EKG is  ordered today.  The ekg ordered today demonstrates SB 57 NSSTW changes.   Recent Labs: No results found for requested labs within last 365 days.  Recent Lipid Panel    Component Value Date/Time   CHOL 117 06/15/2017 0845   TRIG 112 06/15/2017 0845   HDL 41 06/15/2017 0845   CHOLHDL 2.9 06/15/2017 0845   CHOLHDL 3.1 03/12/2016 1447   VLDL 27 03/12/2016 1447   LDLCALC 54 06/15/2017 0845     Risk Assessment/Calculations:              Physical Exam:    VS:  BP (!) 90/50 (BP Location: Left Arm, Patient Position: Sitting, Cuff Size: Normal)   Pulse (!) 57   Ht 5' 4.5" (1.638 m)   Wt 124 lb (56.2 kg)   SpO2 97%   BMI 20.96 kg/m     Wt Readings from Last 3 Encounters:  04/01/22 124 lb (56.2 kg)  01/15/22 123 lb 6.4 oz (56 kg)  04/04/21 126 lb (57.2 kg)     GEN:  Well nourished, well developed in no acute distress HEENT: Normal NECK: No JVD; No carotid bruits LYMPHATICS: No lymphadenopathy CARDIAC: RRR, 3/6 systolic murmur, no rubs, gallops RESPIRATORY:  Clear to auscultation without rales, wheezing or rhonchi  ABDOMEN: Soft, non-tender, non-distended MUSCULOSKELETAL:  No edema; No  deformity  SKIN: Warm and dry NEUROLOGIC:  Alert and oriented x 3 PSYCHIATRIC:  Normal affect   ASSESSMENT:    1. Paroxysmal atrial fibrillation (HCC)   2. S/P aortic valve replacement with bioprosthetic valve   3. Dilated aortic root ([place])   4. Chronic anticoagulation    PLAN:    In order of problems listed above:   Coronary artery disease involving native coronary artery of native heart without angina pectoris -2015 left heart catheterization small circumflex with 40% proximal stenosis.  Normal right sided pressures.  This is nonobstructive disease.  Continue with goal-directed medical therapy.   S/P aortic valve replacement with bioprosthetic valve 2015 23 mm [facility] Ease bovine pericardial tissue valve.  Moderate overall stenosis with mean gradient from 20 up to 23 mmHg.  Stable.  Murmur heard on exam.  Dental prophylaxis prescribed.  She requested 6 tablets in total versus 2   Paroxysmal atrial fibrillation ([place]) -Post ablation 2020 off of antiarrhythmic therapy.  Implantable loop recorder is out now.  Dr. Allred/EP team has been monitoring. -Previous stated that she had been short of breath since she was born. -She is doing very well currently.  Sinus rhythm.  She is relieved.   Chronic anticoagulation  I asked Extensively about TIA history.  Searched epic. She has had no recollection of this.  Her MRI of brain was normal.  No prior evidence of stroke.  I will go ahead and take this off of her past medical history.  This does reduce her CHA2DS2-VASc score to 3.  Lets go ahead and continue with her Eliquis.       Follow-up in 1 year.         Medication Adjustments/Labs and Tests Ordered: Current medicines are reviewed at length with the patient today.  Concerns regarding medicines are outlined above.  Orders Placed This Encounter  Procedures   EKG 12-Lead   No orders of the defined types were placed in this encounter.   Patient Instructions  Medication  Instructions:  The current medical regimen is effective;  continue present plan and medications.  *If  you need a refill on your cardiac medications before your next appointment, please call your pharmacy*  Follow-Up: At [facility], you and your health needs are our priority.  As part of our continuing mission to provide you with exceptional heart care, we have created designated Provider Care Teams.  These Care Teams include your primary Cardiologist (physician) and Advanced Practice Providers (APPs -  Physician Assistants and Nurse Practitioners) who all work together to provide you with the care you need, when you need it.  We recommend signing up for the patient portal called "MyChart".  Sign up information is provided on this After Visit Summary.  MyChart is used to connect with patients for Virtual Visits (Telemedicine).  Patients are able to view lab/test results, encounter notes, upcoming appointments, etc.  Non-urgent messages can be sent to your provider as well.   To learn more about what you can do with MyChart, go to [email or web address].    Your next appointment:   1 year(s)  The format for your next appointment:   In Person  Provider:   Candee Furbish, MD {   Important Information About Sugar         Signed, Candee Furbish, MD  04/01/2022 9:47 AM    [place]

## 2022-04-03 DIAGNOSIS — M25532 Pain in left wrist: Secondary | ICD-10-CM | POA: Diagnosis not present

## 2022-04-03 DIAGNOSIS — M65832 Other synovitis and tenosynovitis, left forearm: Secondary | ICD-10-CM | POA: Diagnosis not present

## 2022-04-03 DIAGNOSIS — S60512A Abrasion of left hand, initial encounter: Secondary | ICD-10-CM | POA: Diagnosis not present

## 2022-04-14 ENCOUNTER — Other Ambulatory Visit: Payer: Self-pay | Admitting: Cardiology

## 2022-04-14 DIAGNOSIS — I480 Paroxysmal atrial fibrillation: Secondary | ICD-10-CM

## 2022-04-14 NOTE — Telephone Encounter (Signed)
Prescription refill request for Eliquis received. Indication: PAF Last office visit: 04/01/22  Derl Barrow MD Scr: 0.87 on 07/23/21 Age:  81 Weight: 56.2kg   Based on above findings Eliquis '5mg'$  twice daily is the appropriate dose.  Refill approved.

## 2022-06-24 DIAGNOSIS — F333 Major depressive disorder, recurrent, severe with psychotic symptoms: Secondary | ICD-10-CM | POA: Diagnosis not present

## 2022-06-24 DIAGNOSIS — I6523 Occlusion and stenosis of bilateral carotid arteries: Secondary | ICD-10-CM | POA: Diagnosis not present

## 2022-06-24 DIAGNOSIS — E039 Hypothyroidism, unspecified: Secondary | ICD-10-CM | POA: Diagnosis not present

## 2022-06-24 DIAGNOSIS — Z952 Presence of prosthetic heart valve: Secondary | ICD-10-CM | POA: Diagnosis not present

## 2022-06-24 DIAGNOSIS — E785 Hyperlipidemia, unspecified: Secondary | ICD-10-CM | POA: Diagnosis not present

## 2022-06-24 DIAGNOSIS — I700 Atherosclerosis of aorta: Secondary | ICD-10-CM | POA: Diagnosis not present

## 2022-06-24 DIAGNOSIS — I679 Cerebrovascular disease, unspecified: Secondary | ICD-10-CM | POA: Diagnosis not present

## 2022-06-24 DIAGNOSIS — G459 Transient cerebral ischemic attack, unspecified: Secondary | ICD-10-CM | POA: Diagnosis not present

## 2022-06-24 DIAGNOSIS — N1831 Chronic kidney disease, stage 3a: Secondary | ICD-10-CM | POA: Diagnosis not present

## 2022-06-24 DIAGNOSIS — E559 Vitamin D deficiency, unspecified: Secondary | ICD-10-CM | POA: Diagnosis not present

## 2022-06-24 DIAGNOSIS — I480 Paroxysmal atrial fibrillation: Secondary | ICD-10-CM | POA: Diagnosis not present

## 2022-06-24 DIAGNOSIS — I2510 Atherosclerotic heart disease of native coronary artery without angina pectoris: Secondary | ICD-10-CM | POA: Diagnosis not present

## 2022-07-29 DIAGNOSIS — H52203 Unspecified astigmatism, bilateral: Secondary | ICD-10-CM | POA: Diagnosis not present

## 2022-07-29 DIAGNOSIS — Z961 Presence of intraocular lens: Secondary | ICD-10-CM | POA: Diagnosis not present

## 2022-07-29 DIAGNOSIS — H04123 Dry eye syndrome of bilateral lacrimal glands: Secondary | ICD-10-CM | POA: Diagnosis not present

## 2022-10-13 ENCOUNTER — Other Ambulatory Visit: Payer: Self-pay | Admitting: Cardiology

## 2022-10-13 DIAGNOSIS — I480 Paroxysmal atrial fibrillation: Secondary | ICD-10-CM

## 2022-10-13 NOTE — Telephone Encounter (Signed)
Prescription refill request for Eliquis received. Indication: a fib Last office visit: 04/01/22 Scr: overdue for labs Age: 82 Weight: 56kg

## 2022-10-14 NOTE — Telephone Encounter (Signed)
Labs received via fax from PCP. Placed in pt's chart under media tab.   Scr: 0.8 (12/09/21 via PCP)

## 2022-10-14 NOTE — Telephone Encounter (Signed)
Called PCP. Confirmed labs were drawn at Dr Baldwin Crown office in April, 20233. Requested labs to be faxed to [facility]. Will wait for labs.

## 2022-10-28 DIAGNOSIS — Z1231 Encounter for screening mammogram for malignant neoplasm of breast: Secondary | ICD-10-CM | POA: Diagnosis not present

## 2022-10-31 ENCOUNTER — Encounter: Payer: Self-pay | Admitting: Gastroenterology

## 2022-10-31 ENCOUNTER — Other Ambulatory Visit (INDEPENDENT_AMBULATORY_CARE_PROVIDER_SITE_OTHER): Payer: Medicare Other

## 2022-10-31 ENCOUNTER — Ambulatory Visit (INDEPENDENT_AMBULATORY_CARE_PROVIDER_SITE_OTHER): Payer: Medicare Other | Admitting: Gastroenterology

## 2022-10-31 ENCOUNTER — Ambulatory Visit (INDEPENDENT_AMBULATORY_CARE_PROVIDER_SITE_OTHER)
Admission: RE | Admit: 2022-10-31 | Discharge: 2022-10-31 | Disposition: A | Discharge status: Home / Self Care | Payer: Medicare Other | Source: Ambulatory Visit | Attending: Gastroenterology | Admitting: Gastroenterology

## 2022-10-31 VITALS — BP 118/64 | HR 72 | Ht 64.5 in | Wt 129.0 lb

## 2022-10-31 DIAGNOSIS — R197 Diarrhea, unspecified: Secondary | ICD-10-CM | POA: Diagnosis not present

## 2022-10-31 DIAGNOSIS — R109 Unspecified abdominal pain: Secondary | ICD-10-CM

## 2022-10-31 LAB — COMPREHENSIVE METABOLIC PANEL
ALT: 19 U/L (ref 0–35)
AST: 21 U/L (ref 0–37)
Albumin: 3.1 g/dL — ABNORMAL LOW (ref 3.5–5.2)
Alkaline Phosphatase: 70 U/L (ref 39–117)
BUN: 10 mg/dL (ref 6–23)
CO2: 25 mEq/L (ref 19–32)
Calcium: 8.6 mg/dL (ref 8.4–10.5)
Chloride: 102 mEq/L (ref 96–112)
Creatinine, Ser: 0.69 mg/dL (ref 0.40–1.20)
GFR: 82.56 mL/min (ref >60.00)
Glucose, Bld: 99 mg/dL (ref 70–99)
Potassium: 3.3 mEq/L — ABNORMAL LOW (ref 3.5–5.1)
Sodium: 136 mEq/L (ref 135–145)
Total Bilirubin: 0.6 mg/dL (ref 0.2–1.2)
Total Protein: 6.1 g/dL (ref 6.0–8.3)

## 2022-10-31 LAB — CBC WITH DIFFERENTIAL/PLATELET
Basophils Absolute: 0 10*3/uL (ref 0.0–0.1)
Basophils Relative: 0.3 % (ref 0.0–3.0)
Eosinophils Absolute: 0 10*3/uL (ref 0.0–0.7)
Eosinophils Relative: 0.3 % (ref 0.0–5.0)
HCT: 34.7 % — ABNORMAL LOW (ref 36.0–46.0)
Hemoglobin: 11.5 g/dL — ABNORMAL LOW (ref 12.0–15.0)
Lymphocytes Relative: 21.9 % (ref 12.0–46.0)
Lymphs Abs: 2.1 10*3/uL (ref 0.7–4.0)
MCHC: 33.3 g/dL (ref 30.0–36.0)
MCV: 83.7 fl (ref 78.0–100.0)
Monocytes Absolute: 1.2 10*3/uL — ABNORMAL HIGH (ref 0.1–1.0)
Monocytes Relative: 12.5 % — ABNORMAL HIGH (ref 3.0–12.0)
Neutro Abs: 6.2 10*3/uL (ref 1.4–7.7)
Neutrophils Relative %: 65 % (ref 43.0–77.0)
Platelets: 252 10*3/uL (ref 150.0–400.0)
RBC: 4.14 Mil/uL (ref 3.87–5.11)
RDW: 15.7 % — ABNORMAL HIGH (ref 11.5–15.5)
WBC: 9.6 10*3/uL (ref 4.0–10.5)

## 2022-10-31 LAB — TSH: TSH: 3.73 u[IU]/mL (ref 0.35–5.50)

## 2022-10-31 LAB — C-REACTIVE PROTEIN: CRP: 6.7 mg/dL (ref 0.5–20.0)

## 2022-10-31 MED ORDER — CHOLESTYRAMINE 4 G PO PACK: 4.0000 g | PACK | Freq: Every day | ORAL | 6 refills | Status: AC

## 2022-10-31 NOTE — Patient Instructions (Addendum)
_______________________________________________________  If your blood pressure at your visit was 140/90 or greater, please contact your primary care physician to follow up on this.  _______________________________________________________  If you are age 80 or older, your body mass index should be between 23-30. Your Body mass index is 21.8 kg/m. If this is out of the aforementioned range listed, please consider follow up with your Primary Care Provider.  If you are age 76 or younger, your body mass index should be between 19-25. Your Body mass index is 21.8 kg/m. If this is out of the aformentioned range listed, please consider follow up with your Primary Care Provider.   ________________________________________________________  The [facility] providers would like to encourage you to use [facility] to communicate with providers for non-urgent requests or questions.  Due to long hold times on the telephone, sending your provider a message by [facility] may be a faster and more efficient way to get a response.  Please allow 48 business hours for a response.  Please remember that this is for non-urgent requests.  _______________________________________________________  Your provider has ordered "Diatherix" stool testing for you. You have received a kit from our office today containing all necessary supplies to complete this test. Please carefully read the stool collection instructions provided in the kit before opening the accompanying materials. In addition, be sure to place the label from the top left corner of the laboratory request sheet onto the "puritan opti-swab" tube that is supplied in the kit. This label should include your full name and date of birth. After completing the test, you should secure the purtian tube into the specimen biohazard bag. The laboratory request information sheet (including date and time of specimen collection) should be placed into the outside pocket of the specimen  biohazard bag and returned to the [place] lab with 2 days of collection.   Your provider has requested that you have an abdominal x ray before leaving today. Please go to the basement floor to our Radiology department for the test.  Your provider has requested that you go to the basement level for lab work before leaving today. Press "B" on the elevator. The lab is located at the first door on the left as you exit the elevator.  Stop all supplements  You have been scheduled for an appointment with Dr. Lyndel Safe on 02-04-2023 at 830am . Please arrive 10 minutes early for your appointment.  We have sent the following medications to your pharmacy for you to pick up at your convenience: Annabelle Harman 1 packet daily take 2 hours before or after all other medications  Please purchase the following medications over the counter and take as directed: Imodium AD 1 tablet with breakfast and dinner  Thank you,  Dr. Jackquline Denmark

## 2022-10-31 NOTE — Progress Notes (Signed)
Chief Complaint: Diarrhea  Referring Provider:  Reynold Bowen, MD      ASSESSMENT AND PLAN;   #1. Diarrhea - H/O Collagenous colitis. D/d Post chole, IBS-D. R/O any infectious etiology.  #2. R inguinal hernia.   Plan: -Stool studies for GI Pathogen (includes C. Diff), fecal elastase and Calprotectin. -Stop all supplements.  Please make sure that she is not taking Mg in any way. -CBC, CMP, CRP, TSH -1 Imodium AD with breakfast and another 1 with dinner -Cholestyramine 4g po QD, 2hrs before or after meds. #30, 6 RF -X ray KUB 2V today -FU in 12 weeks. If still with problems, consider CT, rpt colon after cardio clearence (and holding eliquis) -Previous GI records. -D/W husband in detail    HPI:    Becky Winters is a 82 y.o. female  With A Fib on eliquis, AVR (bovine valve, 2015, Nl EF 03/2022), CAD, OA, anxiety/depression, HLD, hypothyroidism, cholecystectomy, partial hysterectomy  Longstanding intermittent diarrhea x over 20 yrs Dx with collagenous colitis at one of the colonoscopies Failed budesenide 3/day (no effect) and cholestyamine. Now episodic diarrhea-very severe 1 or 2 times per month-when she has uncontrollable diarrhea throughout.  This is associated with incontinence.  Unfortunately, at times she has to clean all the bedsheets.  Gets very embarrassed. These episodes are assoc with abdominal bloating, left lower quadrant pain which gets better with BMs.  She does get constipated intermittently as well.  No bowel movements yesterday.  No melena or hematochezia  No weight loss.  No nausea, vomiting, heartburn, regurgitation, odynophagia or dysphagia.  No abdominal pain in between these episodes.  CT Abdo/pelvis December 2022 did show large amount of fecal matter throughout the colon.    Wt Readings from Last 3 Encounters:  10/31/22 129 lb (58.5 kg)  04/01/22 124 lb (56.2 kg)  01/15/22 123 lb 6.4 oz (56 kg)      Past GI WU: - Multiple  colonoscopies @ [facility]: 1 small hyperplastic rectal polyp removed in 2001. NO polyps in 2004, 2007, 2012 and 2017. Dx with collagenous colitis previously.  Given trial of budesonide 9 mg/day without any benefit.  -IDA: Improved quickly on oral iron   CT AP 08/08/2021 Large amount of fecal matter throughout the colon which could go along with the clinical history of constipation. Small bowel pattern is normal. Small hiatal hernia. Previous cholecystectomy and hysterectomy.   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 , R knee, degenerative spine ,L hip    Bicuspid aortic valve 05/10/2014   Chronic diastolic congestive heart failure ([place])    Collagenous colitis    Compression, esophagus 08/26/2011   stretched in the past    Encounter for therapeutic drug monitoring 06/15/2014   Exertional dyspnea 05/10/2014   Foot swelling    - LEFT-ever since she had an injury as a child    GERD (gastroesophageal reflux disease)    prevacid on occasion   H/O hiatal hernia    HLD (hyperlipidemia)    Hx of echocardiogram    post AVR >> Echo (12/15):  Mild LVH, EF 55%, AVR ok (Mean gradient (S): 10 mm Hg), mild MR, mild LAE   Hypothyroidism    Orthostatic hypotension 05/10/2014   Postoperative atrial fibrillation ([place]) 06/09/2014   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/07/2014  23 mm [facility] Ease bovine pericardial tissue valve   Severe aortic stenosis 05/21/2014   s/p AVR 05/2014   UTI (urinary tract infection)    frequent UTI-----pt. reports that she takes Cipro if needed, last UTI- July 2015    Past Surgical History:  Procedure Laterality Date   AORTIC VALVE REPLACEMENT N/A 06/07/2014   Procedure: AORTIC VALVE REPLACEMENT  (AVR) with 43m Aortic Perimount Magna Ease;  Surgeon: CRexene Alberts MD;  [facility]   Service: Open Heart Surgery;  Laterality: N/A;   APPENDECTOMY     ATRIAL FIBRILLATION ABLATION N/A 05/19/2019   Procedure: ATRIAL FIBRILLATION ABLATION;  Surgeon: AThompson Grayer MD;  [facility];  Service: Cardiovascular;  Laterality: N/A;   BLADDER SURGERY     x2 for tacking   CARDIAC CATHETERIZATION     cardiolite  2003, 2006   CHOLECYSTECTOMY     ESOPHAGEAL DILATION     implantable loop recorder removal  01/15/2022   MDT Reveal LINQ removed   INTRAOPERATIVE TRANSESOPHAGEAL ECHOCARDIOGRAM N/A 06/07/2014   Procedure: INTRAOPERATIVE TRANSESOPHAGEAL ECHOCARDIOGRAM;  Surgeon: CRexene Alberts MD;  [facility]  Service: Open Heart Surgery;  Laterality: N/A;   LEFT AND RIGHT HEART CATHETERIZATION WITH CORONARY ANGIOGRAM N/A 05/25/2014   Procedure: LEFT AND RIGHT HEART CATHETERIZATION WITH CORONARY ANGIOGRAM;  Surgeon: DLarey Dresser MD;  [facility];  Service: Cardiovascular;  Laterality: N/A;   LOOP RECORDER INSERTION N/A 01/07/2018   Procedure: LOOP RECORDER INSERTION;  Surgeon: AThompson Grayer MD;  [facility];  Service: Cardiovascular;  Laterality: N/A;   NASAL SEPTUM SURGERY  08/25/1993   THUMB ARTHROSCOPY Left 08/25/2004   ? replacement of ligament    TONSILLECTOMY     TOTAL ABDOMINAL HYSTERECTOMY     PARTIAL    Family History  Problem Relation Age of Onset   CAD Father 62  Heart attack Father    Stroke Paternal Grandmother    Heart attack Paternal Grandfather    CVA Other    Hypertension Neg Hx    Colon cancer Neg Hx    Stomach cancer Neg Hx    Esophageal cancer Neg Hx     Social History   Tobacco Use   Smoking status: Never   Smokeless tobacco: Never  Vaping Use   Vaping Use: Never used  Substance Use Topics   Alcohol use: No   Drug use: No    Current Outpatient Medications  Medication Sig Dispense Refill   apixaban (ELIQUIS) 5 MG TABS tablet TAKE ONE TABLET BY MOUTH TWICE DAILY 180 tablet 1   atorvastatin  (LIPITOR) 20 MG tablet Take 20 mg by mouth daily.     co-enzyme Q-10 30 MG capsule Take 30 mg by mouth 3 (three) times daily.     COLOSTRUM PO Take by mouth.     doxylamine, Sleep, (UNISOM) 25 MG tablet Take 25 mg by mouth at bedtime as needed.     Emollient (COLLAGEN EX) Apply topically.     Iron Combinations (CHROMAGEN) capsule Take 1 capsule by mouth daily.     levothyroxine (SYNTHROID) 112 MCG tablet Take 1 tablet by mouth daily.     Melatonin 5 MG CAPS Take by mouth.     NON FORMULARY Hyperbiotics 285 mg     Omega-3 Fatty Acids (FISH OIL OMEGA-3 PO) Take by mouth.     RESTASIS 0.05 % ophthalmic emulsion Place 1 drop into both eyes in the morning and  at bedtime.     azithromycin (ZITHROMAX) 250 MG tablet TAKE TWO TABLETS BY MOUTH 30 MINUTES BEFORE DENTAL PROCEDURE (Patient not taking: Reported on 10/31/2022) 6 tablet 1   No current facility-administered medications for this visit.    Allergies  Allergen Reactions   Amiodarone Other (See Comments)    Dizziness, impaired vision (felt like eyes were crossed)   Clindamycin/Lincomycin Itching and Rash    Full body rash   Asacol [Mesalamine] Other (See Comments)    GI Upset  Severe diarrhea    Codeine Nausea And Vomiting   Pentasa [Mesalamine Er] Other (See Comments)    Severe diarrhea    Sulfa Antibiotics Rash   Tramadol Nausea And Vomiting   Amoxicillin Rash    Did it involve swelling of the face/tongue/throat, SOB, or low BP? No Did it involve sudden or severe rash/hives, skin peeling, or any reaction on the inside of your mouth or nose? No Did you need to seek medical attention at a hospital or doctor's office? No When did it last happen?      10 + years If all above answers are "NO", may proceed with cephalosporin use.     Review of Systems:  Constitutional: Denies fever, chills, diaphoresis, appetite change and has fatigue.  HEENT: Denies photophobia, eye pain, redness, hearing loss, ear pain, congestion, sore throat,  rhinorrhea, sneezing, mouth sores, neck pain, neck stiffness and tinnitus.   Respiratory: Denies SOB, DOE, cough, chest tightness,  and wheezing.   Cardiovascular: Denies chest pain, palpitations and leg swelling.  Genitourinary: Denies dysuria, urgency, frequency, hematuria, flank pain and difficulty urinating.  Musculoskeletal: Denies myalgias, back pain, joint swelling, arthralgias and gait problem.  Skin: No rash.  Neurological: Denies dizziness, seizures, syncope, weakness, light-headedness, numbness and headaches.  Hematological: Denies adenopathy. Easy bruising, personal or family bleeding history  Psychiatric/Behavioral: Has anxiety or depression.  Has sleeping problems     Physical Exam:    BP 118/64   Pulse 72   Ht 5' 4.5" (1.638 m)   Wt 129 lb (58.5 kg)   BMI 21.80 kg/m  Wt Readings from Last 3 Encounters:  10/31/22 129 lb (58.5 kg)  04/01/22 124 lb (56.2 kg)  01/15/22 123 lb 6.4 oz (56 kg)   Constitutional:  Well-developed, in no acute distress. Psychiatric: Normal mood and affect. Behavior is normal. HEENT: Pupils normal.  Conjunctivae are normal. No scleral icterus.  Cardiovascular: Normal rate, regular rhythm.  Heart murmur.-Aortic area. Pulmonary/chest: Effort normal and breath sounds normal. No wheezing, rales or rhonchi. Abdominal: Soft, nondistended. Nontender. Bowel sounds active throughout. There are no masses palpable. No hepatomegaly.  Right inguinal hernia 4 cm, best visualized on standing. Rectal: Deferred Neurological: Alert and oriented to person place and time. Skin: Skin is warm and dry. No rashes noted.  Data Reviewed: I have personally reviewed following labs and imaging studies  CBC:    Latest Ref Rng & Units 08/29/2020    4:37 PM 11/08/2019   12:05 PM 05/16/2019    7:48 AM  CBC  WBC 3.4 - 10.8 x10E3/uL 7.2  8.4  10.1   Hemoglobin 11.1 - 15.9 g/dL 14.0  14.2  14.5   Hematocrit 34.0 - 46.6 % 41.7  43.5  42.6   Platelets 150 - 450 x10E3/uL 182   190  209     CMP:    Latest Ref Rng & Units 08/29/2020    4:37 PM 11/08/2019   12:05 PM 05/16/2019    7:48 AM  CMP  Glucose 65 - 99 mg/dL 90  95  95   BUN 8 - 27 mg/dL '25  15  15   '$ Creatinine 0.57 - 1.00 mg/dL 1.01  0.84  0.95   Sodium 134 - 144 mmol/L 141  139  141   Potassium 3.5 - 5.2 mmol/L 4.3  3.5  4.4   Chloride 96 - 106 mmol/L 106  108  104   CO2 20 - 29 mmol/L '23  20  21   '$ Calcium 8.7 - 10.3 mg/dL 9.4  9.2  9.4   Total Protein 6.5 - 8.1 g/dL  6.6    Total Bilirubin 0.3 - 1.2 mg/dL  1.2    Alkaline Phos 38 - 126 U/L  62    AST 15 - 41 U/L  21    ALT 0 - 44 U/L  16      CT reviewed independently and with patient.    Carmell Austria, MD 10/31/2022, 8:52 AM  Cc: Reynold Bowen, MD

## 2022-11-03 ENCOUNTER — Other Ambulatory Visit: Payer: Self-pay

## 2022-11-03 ENCOUNTER — Other Ambulatory Visit: Payer: Medicare Other

## 2022-11-03 DIAGNOSIS — R197 Diarrhea, unspecified: Secondary | ICD-10-CM

## 2022-11-03 DIAGNOSIS — R109 Unspecified abdominal pain: Secondary | ICD-10-CM

## 2022-11-03 DIAGNOSIS — E876 Hypokalemia: Secondary | ICD-10-CM

## 2022-11-03 MED ORDER — POTASSIUM CHLORIDE CRYS ER 20 MEQ PO TBCR: 20.0000 meq | EXTENDED_RELEASE_TABLET | Freq: Every day | ORAL | 0 refills | Status: AC

## 2022-11-06 ENCOUNTER — Other Ambulatory Visit: Payer: Self-pay

## 2022-11-06 ENCOUNTER — Telehealth: Payer: Self-pay

## 2022-11-06 ENCOUNTER — Telehealth: Payer: Self-pay | Admitting: Gastroenterology

## 2022-11-06 DIAGNOSIS — R197 Diarrhea, unspecified: Secondary | ICD-10-CM

## 2022-11-06 DIAGNOSIS — A0472 Enterocolitis due to Clostridium difficile, not specified as recurrent: Secondary | ICD-10-CM

## 2022-11-06 MED ORDER — VANCOMYCIN HCL 125 MG PO CAPS: 125.0000 mg | ORAL_CAPSULE | Freq: Two times a day (BID) | ORAL | 0 refills | Status: AC

## 2022-11-06 NOTE — Telephone Encounter (Signed)
Please see noted below and advise: Pt called back and requested to cancel CT.  CT canceled Please  review and decide about rescheduling / timing of rescheduling.

## 2022-11-06 NOTE — Telephone Encounter (Signed)
Pt made aware of Dr. Silvio Pate recommendations: Pt stated that she did not want to cancel her CT scan and wanted to proceed with it. Please advise

## 2022-11-06 NOTE — Telephone Encounter (Signed)
-----   Message from [place], DO sent at 11/04/2022  5:07 PM EDT ----- Received a call from [facility] lab on this patient with C diff positive.   Remo Lipps, can you please call in the following in the AM: - Vancomycin 125 mg PO BID x10 days - Start probiotic and resume 4 weeks beyond completion of Abx course  Thanks.  RG, FYI on your patient.   VC

## 2022-11-06 NOTE — Telephone Encounter (Signed)
Cancel CT scan for 3/16. RG to review and decide about rescheduling / timing of rescheduling.

## 2022-11-06 NOTE — Telephone Encounter (Signed)
Patient called in requesting to speak with you regarding CT appointment.Please advise

## 2022-11-06 NOTE — Telephone Encounter (Signed)
Dr. Lyndel Safe Pt Pt made aware of recent results and Dr. Bryan Lemma recommendations:  Prescription sent to pharmacy. Pt made aware. Pt notified to Start probiotic and resume 4 weeks beyond completion of Abx course:  Pt stated that she is worried about the CT scan that is ordered for her on 11/08/2022 at 3:00 PM due to the fact that she has the diarrhea so frequent.    In regard to recent results Please advise if pt is to continue with the CT scan    Please advise as DOD.   Dr. Lyndel Safe and Dr. Bryan Lemma are out of the office today.

## 2022-11-06 NOTE — Telephone Encounter (Signed)
Spoke with Pt. Documented in other phone note Pt verbalized understanding with all questions answered.

## 2022-11-07 LAB — CALPROTECTIN, FECAL: Calprotectin, Fecal: 3300 ug/g — ABNORMAL HIGH (ref 0–120)

## 2022-11-07 NOTE — Telephone Encounter (Signed)
Noted  

## 2022-11-07 NOTE — Telephone Encounter (Signed)
Cancel CT Abdo/pelvis for now RG

## 2022-11-08 ENCOUNTER — Ambulatory Visit (HOSPITAL_BASED_OUTPATIENT_CLINIC_OR_DEPARTMENT_OTHER): Payer: Medicare Other

## 2022-11-08 LAB — PANCREATIC ELASTASE, FECAL: Pancreatic Elastase-1, Stool: 94 mcg/g — ABNORMAL LOW

## 2022-11-10 ENCOUNTER — Encounter: Payer: Self-pay | Admitting: Gastroenterology

## 2022-11-11 ENCOUNTER — Encounter: Payer: Self-pay | Admitting: Gastroenterology

## 2022-11-13 NOTE — Telephone Encounter (Signed)
-  Diarrhea may get little worse before it starts getting better.  Please finish vancomycin -Can take 2 pouches of cholestyramine.  Please remember to take it 2 hours before or after taking medications. -Can take 2 probiotics per day -Restart iron tablets  If still with problems, will give her a trial of Creon (she had low pancreatic elastase) RG

## 2022-11-14 ENCOUNTER — Telehealth: Payer: Self-pay | Admitting: Gastroenterology

## 2022-11-14 NOTE — Telephone Encounter (Signed)
Noted. Documented in result note. 

## 2022-11-14 NOTE — Telephone Encounter (Signed)
Inbound call from patient, wishing to let Remo Lipps know the medications that were prescribed are working and she is having firmer bowel movements.

## 2022-11-18 ENCOUNTER — Other Ambulatory Visit (INDEPENDENT_AMBULATORY_CARE_PROVIDER_SITE_OTHER): Payer: Medicare Other

## 2022-11-18 DIAGNOSIS — R197 Diarrhea, unspecified: Secondary | ICD-10-CM

## 2022-11-18 DIAGNOSIS — E876 Hypokalemia: Secondary | ICD-10-CM | POA: Diagnosis not present

## 2022-11-18 LAB — MAGNESIUM: Magnesium: 1.8 mg/dL (ref 1.5–2.5)

## 2022-11-18 LAB — BASIC METABOLIC PANEL
BUN: 13 mg/dL (ref 6–23)
CO2: 26 mEq/L (ref 19–32)
Calcium: 9.1 mg/dL (ref 8.4–10.5)
Chloride: 103 mEq/L (ref 96–112)
Creatinine, Ser: 0.77 mg/dL (ref 0.40–1.20)
GFR: 73.35 mL/min (ref >60.00)
Glucose, Bld: 111 mg/dL — ABNORMAL HIGH (ref 70–99)
Potassium: 4.7 mEq/L (ref 3.5–5.1)
Sodium: 136 mEq/L (ref 135–145)

## 2022-11-18 NOTE — Telephone Encounter (Signed)
Inbound call from patient states she is returning a call. Please advise

## 2022-11-18 NOTE — Telephone Encounter (Signed)
Pt stated that when she has a BM now it seems to explode out of her. Pt recently treated for C diff. Pt has no complaints of diarrhea, no abdominal pain, no fever, no nausea. Pt was recommended to take the probiotic twice a day as advised by Dr. Lyndel Safe. Pt notified to call office back if symptoms don't resolve.  Pt verbalized understanding with all questions answered.

## 2022-12-23 DIAGNOSIS — R7989 Other specified abnormal findings of blood chemistry: Secondary | ICD-10-CM | POA: Diagnosis not present

## 2022-12-23 DIAGNOSIS — K219 Gastro-esophageal reflux disease without esophagitis: Secondary | ICD-10-CM | POA: Diagnosis not present

## 2022-12-23 DIAGNOSIS — D500 Iron deficiency anemia secondary to blood loss (chronic): Secondary | ICD-10-CM | POA: Diagnosis not present

## 2022-12-23 DIAGNOSIS — E559 Vitamin D deficiency, unspecified: Secondary | ICD-10-CM | POA: Diagnosis not present

## 2022-12-23 DIAGNOSIS — I700 Atherosclerosis of aorta: Secondary | ICD-10-CM | POA: Diagnosis not present

## 2022-12-23 DIAGNOSIS — E785 Hyperlipidemia, unspecified: Secondary | ICD-10-CM | POA: Diagnosis not present

## 2022-12-23 DIAGNOSIS — Z1212 Encounter for screening for malignant neoplasm of rectum: Secondary | ICD-10-CM | POA: Diagnosis not present

## 2022-12-23 DIAGNOSIS — I480 Paroxysmal atrial fibrillation: Secondary | ICD-10-CM | POA: Diagnosis not present

## 2022-12-23 DIAGNOSIS — E039 Hypothyroidism, unspecified: Secondary | ICD-10-CM | POA: Diagnosis not present

## 2022-12-30 DIAGNOSIS — I2510 Atherosclerotic heart disease of native coronary artery without angina pectoris: Secondary | ICD-10-CM | POA: Diagnosis not present

## 2022-12-30 DIAGNOSIS — I679 Cerebrovascular disease, unspecified: Secondary | ICD-10-CM | POA: Diagnosis not present

## 2022-12-30 DIAGNOSIS — N1831 Chronic kidney disease, stage 3a: Secondary | ICD-10-CM | POA: Diagnosis not present

## 2022-12-30 DIAGNOSIS — Z1331 Encounter for screening for depression: Secondary | ICD-10-CM | POA: Diagnosis not present

## 2022-12-30 DIAGNOSIS — I700 Atherosclerosis of aorta: Secondary | ICD-10-CM | POA: Diagnosis not present

## 2022-12-30 DIAGNOSIS — I6523 Occlusion and stenosis of bilateral carotid arteries: Secondary | ICD-10-CM | POA: Diagnosis not present

## 2022-12-30 DIAGNOSIS — E785 Hyperlipidemia, unspecified: Secondary | ICD-10-CM | POA: Diagnosis not present

## 2022-12-30 DIAGNOSIS — Z0000 Encounter for general adult medical examination without abnormal findings: Secondary | ICD-10-CM | POA: Diagnosis not present

## 2022-12-30 DIAGNOSIS — K529 Noninfective gastroenteritis and colitis, unspecified: Secondary | ICD-10-CM | POA: Diagnosis not present

## 2022-12-30 DIAGNOSIS — I480 Paroxysmal atrial fibrillation: Secondary | ICD-10-CM | POA: Diagnosis not present

## 2022-12-30 DIAGNOSIS — E039 Hypothyroidism, unspecified: Secondary | ICD-10-CM | POA: Diagnosis not present

## 2022-12-30 DIAGNOSIS — R82998 Other abnormal findings in urine: Secondary | ICD-10-CM | POA: Diagnosis not present

## 2022-12-30 DIAGNOSIS — F333 Major depressive disorder, recurrent, severe with psychotic symptoms: Secondary | ICD-10-CM | POA: Diagnosis not present

## 2023-01-27 ENCOUNTER — Ambulatory Visit (INDEPENDENT_AMBULATORY_CARE_PROVIDER_SITE_OTHER): Payer: Medicare Other | Admitting: Gastroenterology

## 2023-01-27 ENCOUNTER — Encounter: Payer: Self-pay | Admitting: Gastroenterology

## 2023-01-27 VITALS — BP 120/64 | HR 53 | Ht 64.5 in | Wt 121.0 lb

## 2023-01-27 DIAGNOSIS — K4090 Unilateral inguinal hernia, without obstruction or gangrene, not specified as recurrent: Secondary | ICD-10-CM

## 2023-01-27 DIAGNOSIS — R197 Diarrhea, unspecified: Secondary | ICD-10-CM | POA: Diagnosis not present

## 2023-01-27 NOTE — Patient Instructions (Addendum)
_______________________________________________________  If your blood pressure at your visit was 140/90 or greater, please contact your primary care physician to follow up on this.  _______________________________________________________  If you are age 80 or older, your body mass index should be between 23-30. Your Body mass index is 20.45 kg/m. If this is out of the aforementioned range listed, please consider follow up with your Primary Care Provider.  If you are age 109 or younger, your body mass index should be between 19-25. Your Body mass index is 20.45 kg/m. If this is out of the aformentioned range listed, please consider follow up with your Primary Care Provider.   ________________________________________________________  The [facility] providers would like to encourage you to use [facility] to communicate with providers for non-urgent requests or questions.  Due to long hold times on the telephone, sending your provider a message by [facility] may be a faster and more efficient way to get a response.  Please allow 48 business hours for a response.  Please remember that this is for non-urgent requests.  _______________________________________________________  Continue current medications  Follow up as needed. Call with any questions or concerns.  Kegel Exercises  Kegel exercises can help strengthen your pelvic floor muscles. The pelvic floor is a group of muscles that support your rectum, small intestine, and bladder. In females, pelvic floor muscles also help support the uterus. These muscles help you control the flow of urine and stool (feces). Kegel exercises are painless and simple. They do not require any equipment. Your provider may suggest Kegel exercises to: Improve bladder and bowel control. Improve sexual response. Improve weak pelvic floor muscles after surgery to remove the uterus (hysterectomy) or after pregnancy, in females. Improve weak pelvic floor muscles after  prostate gland removal or surgery, in males. Kegel exercises involve squeezing your pelvic floor muscles. These are the same muscles you squeeze when you try to stop the flow of urine or keep from passing gas. The exercises can be done while sitting, standing, or lying down, but it is best to vary your position. Ask your health care provider which exercises are safe for you. Do exercises exactly as told by your health care provider and adjust them as directed. Do not begin these exercises until told by your health care provider. Exercises How to do Kegel exercises: Squeeze your pelvic floor muscles tight. You should feel a tight lift in your rectal area. If you are a female, you should also feel a tightness in your vaginal area. Keep your stomach, buttocks, and legs relaxed. Hold the muscles tight for up to 10 seconds. Breathe normally. Relax your muscles for up to 10 seconds. Repeat as told by your health care provider. Repeat this exercise daily as told by your health care provider. Continue to do this exercise for at least 4-6 weeks, or for as long as told by your health care provider. You may be referred to a physical therapist who can help you learn more about how to do Kegel exercises. Depending on your condition, your health care provider may recommend: Varying how long you squeeze your muscles. Doing several sets of exercises every day. Doing exercises for several weeks. Making Kegel exercises a part of your regular exercise routine. This information is not intended to replace advice given to you by your health care provider. Make sure you discuss any questions you have with your health care provider. Document Revised: 12/20/2020 Document Reviewed: 12/20/2020 Elsevier Patient Education  2024 [employer].

## 2023-01-27 NOTE — Progress Notes (Signed)
Chief Complaint: Diarrhea  Referring Provider:  Adrian Prince, MD      ASSESSMENT AND PLAN;   #1. Diarrhea (resolved) - H/O Collagenous colitis. D/d Post chole, IBS-D. R/O any infectious etiology. H/O occ incontinence.   #2. R inguinal hernia.   Plan: -Kegel's exercises -Continue Cholestyramine 4g po PRN -FU PRN  -D/W husband in detail    HPI:    Becky Winters is a 82 y.o. female  With A Fib on eliquis, AVR (bovine valve, 2015, Nl EF 03/2022), CAD, OA, anxiety/depression, HLD, hypothyroidism, cholecystectomy, partial hysterectomy  For FU Doing very well.  Very pleased with progress.  Wants to hold off on any further GI eval at this time. No further diarrhea She took cholestyramine which resulted in constipation. She now uses it on as needed basis She does have diarrhea if she uses ibuprofen or if she drinks milk.  No weight loss.  No melena or hematochezia.  She has occasional incontinence with associated urinary incontinence.   From previous notes: longstanding intermittent diarrhea x over 20 yrs Dx with collagenous colitis at one of the colonoscopies Failed budesenide 3/day (no effect) and cholestyamine. Now episodic diarrhea-very severe 1 or 2 times per month-when she has uncontrollable diarrhea throughout.  This is associated with incontinence.  Unfortunately, at times she has to clean all the bedsheets.  Gets very embarrassed.    Wt Readings from Last 3 Encounters:  01/27/23 121 lb (54.9 kg)  10/31/22 129 lb (58.5 kg)  04/01/22 124 lb (56.2 kg)      Past GI WU: - Multiple colonoscopies @ [facility]: 1 small hyperplastic rectal polyp removed in 2001. NO polyps in 2004, 2007, 2012 and 2017. Dx with collagenous colitis previously.  Given trial of budesonide 9 mg/day without any benefit.  -IDA: Improved quickly on oral iron   CT AP 08/08/2021 Large amount of fecal matter throughout the colon which could go along with the clinical history of constipation.  Small bowel pattern is normal. Small hiatal hernia. Previous cholecystectomy and hysterectomy.   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 , R knee, degenerative spine ,L hip    Bicuspid aortic valve 05/10/2014   Chronic diastolic congestive heart failure (HCC)    Collagenous colitis    Compression, esophagus 08/26/2011   stretched in the past    Encounter for therapeutic drug monitoring 06/15/2014   Exertional dyspnea 05/10/2014   Foot swelling    - LEFT-ever since she had an injury as a child    GERD (gastroesophageal reflux disease)    prevacid on occasion   H/O hiatal hernia    HLD (hyperlipidemia)    Hx of echocardiogram    post AVR >> Echo (12/15):  Mild LVH, EF 55%, AVR ok (Mean gradient (S): 10 mm Hg), mild MR, mild LAE   Hypothyroidism    Orthostatic hypotension 05/10/2014   Postoperative atrial fibrillation (HCC) 06/09/2014   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/07/2014   23 mm [facility] Ease bovine pericardial tissue valve   Severe aortic stenosis 05/21/2014   s/p AVR 05/2014   UTI (urinary tract infection)    frequent UTI-----pt. reports that she takes Cipro if needed, last UTI- July 2015    Past Surgical History:  Procedure Laterality Date   AORTIC VALVE REPLACEMENT N/A  06/07/2014   Procedure: AORTIC VALVE REPLACEMENT  (AVR) with 23mm Aortic Perimount Magna Ease;  Surgeon: Purcell Nails, MD;  Location: [facility] OR;  Service: Open Heart Surgery;  Laterality: N/A;   APPENDECTOMY     ATRIAL FIBRILLATION ABLATION N/A 05/19/2019   Procedure: ATRIAL FIBRILLATION ABLATION;  Surgeon: Hillis Range, MD;  Location: [facility] INVASIVE CV LAB;  Service: Cardiovascular;  Laterality: N/A;   BLADDER SURGERY     x2 for tacking   CARDIAC CATHETERIZATION     cardiolite  2003, 2006   CHOLECYSTECTOMY     ESOPHAGEAL  DILATION     implantable loop recorder removal  01/15/2022   MDT Reveal LINQ removed   INTRAOPERATIVE TRANSESOPHAGEAL ECHOCARDIOGRAM N/A 06/07/2014   Procedure: INTRAOPERATIVE TRANSESOPHAGEAL ECHOCARDIOGRAM;  Surgeon: Purcell Nails, MD;  Location: [facility] OR;  Service: Open Heart Surgery;  Laterality: N/A;   LEFT AND RIGHT HEART CATHETERIZATION WITH CORONARY ANGIOGRAM N/A 05/25/2014   Procedure: LEFT AND RIGHT HEART CATHETERIZATION WITH CORONARY ANGIOGRAM;  Surgeon: Laurey Morale, MD;  Location: [facility] CATH LAB;  Service: Cardiovascular;  Laterality: N/A;   LOOP RECORDER INSERTION N/A 01/07/2018   Procedure: LOOP RECORDER INSERTION;  Surgeon: Hillis Range, MD;  Location: [facility] INVASIVE CV LAB;  Service: Cardiovascular;  Laterality: N/A;   NASAL SEPTUM SURGERY  08/25/1993   THUMB ARTHROSCOPY Left 08/25/2004   ? replacement of ligament    TONSILLECTOMY     TOTAL ABDOMINAL HYSTERECTOMY     PARTIAL    Family History  Problem Relation Age of Onset   CAD Father 48   Heart attack Father    Stroke Paternal Grandmother    Heart attack Paternal Grandfather    CVA Other    Hypertension Neg Hx    Colon cancer Neg Hx    Stomach cancer Neg Hx    Esophageal cancer Neg Hx     Social History   Tobacco Use   Smoking status: Never   Smokeless tobacco: Never  Vaping Use   Vaping Use: Never used  Substance Use Topics   Alcohol use: No   Drug use: No    Current Outpatient Medications  Medication Sig Dispense Refill   apixaban (ELIQUIS) 5 MG TABS tablet TAKE ONE TABLET BY MOUTH TWICE DAILY 180 tablet 1   atorvastatin (LIPITOR) 20 MG tablet Take 20 mg by mouth daily.     azithromycin (ZITHROMAX) 250 MG tablet TAKE TWO TABLETS BY MOUTH 30 MINUTES BEFORE DENTAL PROCEDURE 6 tablet 1   co-enzyme Q-10 30 MG capsule Take 30 mg by mouth daily. Pt taking 300 mg 1 a day     COLOSTRUM PO Take by mouth.     doxylamine, Sleep, (UNISOM) 25 MG tablet Take 25 mg by mouth at bedtime as needed.     Iron  Combinations (CHROMAGEN) capsule Take 1 capsule by mouth daily.     levothyroxine (SYNTHROID) 112 MCG tablet Take 1 tablet by mouth daily.     Melatonin 5 MG CAPS Take by mouth.     NON FORMULARY Hyperbiotics 285 mg     Omega-3 Fatty Acids (FISH OIL OMEGA-3 PO) Take by mouth.     RESTASIS 0.05 % ophthalmic emulsion Place 1 drop into both eyes in the morning and at bedtime.     cholestyramine (QUESTRAN) 4 g packet Take 1 packet (4 g total) by mouth daily. Take 2 hours before or after all other medications (Patient not taking: Reported on 01/27/2023) 30 packet 6   Emollient (COLLAGEN EX)  Apply topically. (Patient not taking: Reported on 01/27/2023)     potassium chloride SA (KLOR-CON M) 20 MEQ tablet Take 1 tablet (20 mEq total) by mouth daily for 14 days. 14 tablet 0   No current facility-administered medications for this visit.    Allergies  Allergen Reactions   Amiodarone Other (See Comments)    Dizziness, impaired vision (felt like eyes were crossed)   Clindamycin/Lincomycin Itching and Rash    Full body rash   Asacol [Mesalamine] Other (See Comments)    GI Upset  Severe diarrhea    Codeine Nausea And Vomiting   Pentasa [Mesalamine Er] Other (See Comments)    Severe diarrhea    Sulfa Antibiotics Rash   Tramadol Nausea And Vomiting   Amoxicillin Rash    Did it involve swelling of the face/tongue/throat, SOB, or low BP? No Did it involve sudden or severe rash/hives, skin peeling, or any reaction on the inside of your mouth or nose? No Did you need to seek medical attention at a hospital or doctor's office? No When did it last happen?      10 + years If all above answers are "NO", may proceed with cephalosporin use.     Review of Systems:  Constitutional: Denies fever, chills, diaphoresis, appetite change and has fatigue.  HEENT: Denies photophobia, eye pain, redness, hearing loss, ear pain, congestion, sore throat, rhinorrhea, sneezing, mouth sores, neck pain, neck stiffness and  tinnitus.   Respiratory: Denies SOB, DOE, cough, chest tightness,  and wheezing.   Cardiovascular: Denies chest pain, palpitations and leg swelling.  Genitourinary: Denies dysuria, urgency, frequency, hematuria, flank pain and difficulty urinating.  Musculoskeletal: Denies myalgias, back pain, joint swelling, arthralgias and gait problem.  Skin: No rash.  Neurological: Denies dizziness, seizures, syncope, weakness, light-headedness, numbness and headaches.  Hematological: Denies adenopathy. Easy bruising, personal or family bleeding history  Psychiatric/Behavioral: Has anxiety or depression.  Has sleeping problems     Physical Exam:    BP 120/64   Pulse (!) 53   Ht 5' 4.5" (1.638 m)   Wt 121 lb (54.9 kg)   BMI 20.45 kg/m  Wt Readings from Last 3 Encounters:  01/27/23 121 lb (54.9 kg)  10/31/22 129 lb (58.5 kg)  04/01/22 124 lb (56.2 kg)   Constitutional:  Well-developed, in no acute distress. Psychiatric: Normal mood and affect. Behavior is normal. HEENT: Pupils normal.  Conjunctivae are normal. No scleral icterus.  Cardiovascular: Normal rate, regular rhythm.  Heart murmur.-Aortic area. Pulmonary/chest: Effort normal and breath sounds normal. No wheezing, rales or rhonchi. Abdominal: Soft, nondistended. Nontender. Bowel sounds active throughout. There are no masses palpable. No hepatomegaly.  Right inguinal hernia 4 cm, best visualized on standing. Rectal: Deferred Neurological: Alert and oriented to person place and time. Skin: Skin is warm and dry. No rashes noted.  Data Reviewed: I have personally reviewed following labs and imaging studies  CBC:    Latest Ref Rng & Units 10/31/2022   10:26 AM 08/29/2020    4:37 PM 11/08/2019   12:05 PM  CBC  WBC 4.0 - 10.5 K/uL 9.6  7.2  8.4   Hemoglobin 12.0 - 15.0 g/dL 16.1  09.6  04.5   Hematocrit 36.0 - 46.0 % 34.7  41.7  43.5   Platelets 150.0 - 400.0 K/uL 252.0  182  190     CMP:    Latest Ref Rng & Units 11/18/2022    9:39  AM 10/31/2022   10:26 AM 08/29/2020  4:37 PM  CMP  Glucose 70 - 99 mg/dL 161  99  90   BUN 6 - 23 mg/dL 13  10  25    Creatinine 0.40 - 1.20 mg/dL 0.96  0.45  4.09   Sodium 135 - 145 mEq/L 136  136  141   Potassium 3.5 - 5.1 mEq/L 4.7  3.3  4.3   Chloride 96 - 112 mEq/L 103  102  106   CO2 19 - 32 mEq/L 26  25  23    Calcium 8.4 - 10.5 mg/dL 9.1  8.6  9.4   Total Protein 6.0 - 8.3 g/dL  6.1    Total Bilirubin 0.2 - 1.2 mg/dL  0.6    Alkaline Phos 39 - 117 U/L  70    AST 0 - 37 U/L  21    ALT 0 - 35 U/L  19         Edman Circle, MD 01/27/2023, 11:37 AM  Cc: Adrian Prince, MD

## 2023-02-04 ENCOUNTER — Ambulatory Visit: Payer: Medicare Other | Admitting: Gastroenterology

## 2023-04-09 ENCOUNTER — Other Ambulatory Visit: Payer: Self-pay | Admitting: Cardiology

## 2023-04-09 DIAGNOSIS — I480 Paroxysmal atrial fibrillation: Secondary | ICD-10-CM

## 2023-04-09 NOTE — Telephone Encounter (Signed)
Eliquis 5mg  refill request received. Patient is 82 years old, weight-54.9kg, Crea-0.77 on 11/18/22, Diagnosis-Afib, and last seen by Dr. Anne Fu on 04/01/22 and pending an appt on 05/26/23. Dose is appropriate based on dosing criteria. Will send in refill to requested pharmacy.    Pt will turn 82 years old in October and may need a dose reduction at that time so limited refill sent.

## 2023-05-26 ENCOUNTER — Ambulatory Visit: Payer: Medicare Other | Attending: Cardiology | Admitting: Cardiology

## 2023-05-26 ENCOUNTER — Encounter: Payer: Self-pay | Admitting: Cardiology

## 2023-05-26 VITALS — BP 112/62 | HR 52 | Ht 64.5 in | Wt 119.6 lb

## 2023-05-26 DIAGNOSIS — I480 Paroxysmal atrial fibrillation: Secondary | ICD-10-CM | POA: Diagnosis not present

## 2023-05-26 DIAGNOSIS — Z7901 Long term (current) use of anticoagulants: Secondary | ICD-10-CM

## 2023-05-26 DIAGNOSIS — Z953 Presence of xenogenic heart valve: Secondary | ICD-10-CM

## 2023-05-26 DIAGNOSIS — Q2381 Bicuspid aortic valve: Secondary | ICD-10-CM | POA: Diagnosis not present

## 2023-05-26 DIAGNOSIS — E782 Mixed hyperlipidemia: Secondary | ICD-10-CM

## 2023-05-26 NOTE — Patient Instructions (Signed)
Medication Instructions:  Your physician recommends that you continue on your current medications as directed. Please refer to the Current Medication list given to you today.  *If you need a refill on your cardiac medications before your next appointment, please call your pharmacy*  Lab Work: None ordered today.  Testing/Procedures: Your physician has requested that you have an echocardiogram. Echocardiography is a painless test that uses sound waves to create images of your heart. It provides your doctor with information about the size and shape of your heart and how well your heart's chambers and valves are working. This procedure takes approximately one hour. There are no restrictions for this procedure. Please do NOT wear cologne, perfume, aftershave, or lotions (deodorant is allowed). Please arrive 15 minutes prior to your appointment time.   Follow-Up: At [facility], you and your health needs are our priority.  As part of our continuing mission to provide you with exceptional heart care, we have created designated Provider Care Teams.  These Care Teams include your primary Cardiologist (physician) and Advanced Practice Providers (APPs -  Physician Assistants and Nurse Practitioners) who all work together to provide you with the care you need, when you need it.  Your next appointment:   12 month(s)  The format for your next appointment:   In Person  Provider:   Donato Schultz, MD

## 2023-05-26 NOTE — Progress Notes (Signed)
Cardiology Office Note:  .   Date:  05/26/2023  ID:  Becky Winters, DOB 1941-03-04, MRN [id number] PCP: Adrian Prince, MD  [facility] Providers Cardiologist:  Donato Schultz, MD    History of Present Illness: .   Becky Winters is a 82 y.o. female Discussed with the use of AI scribe software   History of Present Illness   The patient, with a history of aortic valve replacement, atrial fibrillation, and hyperlipidemia, presents for a routine follow-up. She reports feeling well overall, with no complaints of chest pain, shortness of breath, or bleeding. She has been maintaining an active lifestyle, including regular walking, and has experienced a significant weight loss of 50 pounds post-COVID vaccination, which was unintentional but welcome.  The patient's aortic valve was replaced in 2015, and she has been on Eliquis for anticoagulation and Atorvastatin 20mg  for hyperlipidemia. She has been diligent about dental prophylaxis and antibiotic use before dental procedures due to her history of aortic valve replacement.  The patient has a history of atrial fibrillation, for which she underwent successful ablation in 2020. She also had a loop recorder implanted in 2019, which has since been removed. She has no past history of TIA, and a previous MRI of the brain was normal.  She has also been dealing with some "wear and tear," although she did not elaborate on this.  The patient's most recent echocardiogram showed a normal pump function with an EF of 60-65%, a mildly dilated right atrium, and a 23mm perimount bioprosthetic valve in the aortic position with a mean gradient of , up from . Calcification was noted on a prior CT in September 2020.  The patient is reluctant to undergo further interventions, expressing a desire to avoid further procedures and stating that she would prefer to die before her spouse. Despite this, she agreed to an annual echocardiogram to monitor her  valve.        Studies Reviewed: Marland Kitchen   EKG Interpretation Date/Time:  Tuesday May 26 2023 08:37:34 EDT Ventricular Rate:  52 PR Interval:  154 QRS Duration:  86 QT Interval:  442 QTC Calculation: 411 R Axis:   42  Text Interpretation: Sinus bradycardia When compared with ECG of 08-Nov-2019 13:35, No significant change since last tracing Confirmed by Donato Schultz ([id number]) on 05/26/2023 8:42:36 AM    Results LABS Hb: 11.5 (10/31/2022) HbA1c: 5.4 LDL: 57  RADIOLOGY MRI brain: Normal  DIAGNOSTIC Echocardiogram: Normal ejection fraction 60-65%, mildly dilated right atrium, 23 mm perimount bioprosthetic valve in aortic position with mean gradient 23 mmHg, calcification noted (03/25/2022)  Risk Assessment/Calculations:            Physical Exam:   VS:  BP 112/62   Pulse (!) 52   Ht 5' 4.5" (1.638 m)   Wt 119 lb 9.6 oz (54.3 kg)   SpO2 98%   BMI 20.21 kg/m    Wt Readings from Last 3 Encounters:  05/26/23 119 lb 9.6 oz (54.3 kg)  01/27/23 121 lb (54.9 kg)  10/31/22 129 lb (58.5 kg)    GEN: Well nourished, well developed in no acute distress NECK: No JVD; No carotid bruits CARDIAC: RRR, 2/6 SM, no rubs, gallops RESPIRATORY:  Clear to auscultation without rales, wheezing or rhonchi  ABDOMEN: Soft, non-tender, non-distended EXTREMITIES:  No edema; No deformity   ASSESSMENT AND PLAN: .    Assessment and Plan    Bioprosthetic Aortic Valve Prior replacement in 2015, Dr. Cornelius Moras with recent echocardiogram showing mildly  dilated right atrium and a 23mm perimount bioprosthetic valve in the aortic position with mean gradient , up from . Calcification noted on prior CT. Patient reluctant to consider further intervention. -Order echocardiogram for annual follow-up.  She is somewhat resistant to repeating echocardiogram.  Explained to her the importance at this timeframe for her aortic valve as we would like to catch deterioration early and give her opportunity for  potential valve in valve in the future.  She is also hesitant with this as a family member/cousin of her husband John died while having this procedure.  Atrial Fibrillation parox Prior successful ablation of atrial flutter. CHA2DS2-VASc score of 3. Anticoagulated with Eliquis. No history of TIA, normal brain MRI. -Continue Eliquis.  Hyperlipidemia LDL cholesterol 57 on atorvastatin 20mg  daily. Patient also reports significant weight loss. -Continue atorvastatin 20mg  daily.  General Health Maintenance Regular dental prophylaxis due to aortic valve replacement. Active lifestyle with regular walking. -Continue dental prophylaxis with antibiotics. -Encourage continued active lifestyle.            Dispo: 1 yr  Signed, Donato Schultz, MD  .

## 2023-06-11 ENCOUNTER — Encounter: Payer: Self-pay | Admitting: Gastroenterology

## 2023-06-11 ENCOUNTER — Ambulatory Visit (HOSPITAL_COMMUNITY): Payer: Medicare Other | Attending: Cardiology

## 2023-06-11 DIAGNOSIS — Z952 Presence of prosthetic heart valve: Secondary | ICD-10-CM

## 2023-06-11 DIAGNOSIS — Z953 Presence of xenogenic heart valve: Secondary | ICD-10-CM | POA: Diagnosis not present

## 2023-06-11 LAB — ECHOCARDIOGRAM COMPLETE
AR max vel: 1.26 cm2
AV Area VTI: 1.43 cm2
AV Area mean vel: 1.28 cm2
AV Mean grad: 18 mm[Hg]
AV Peak grad: 32.7 mm[Hg]
Ao pk vel: 2.86 m/s
Area-P 1/2: 3.99 cm2
S' Lateral: 2.9 cm

## 2023-06-15 ENCOUNTER — Telehealth: Payer: Self-pay | Admitting: Gastroenterology

## 2023-06-15 NOTE — Telephone Encounter (Signed)
Spoke to pt.  Documented in phone note.

## 2023-06-15 NOTE — Telephone Encounter (Signed)
Inbound call from patient requesting a call regarding 10/17 mychart message. Patient states she has been having liquid stools. States she had a "rough" weekend. Advised patient mychart message was sent to Dr. Chales Abrahams for review. Please advise, thank you.

## 2023-06-15 NOTE — Telephone Encounter (Signed)
Pt stated that she has had liquid stools for 8 days, associated with some nausea. Taking cholestyramine and imodium and  nothing is helping. Pt stated that she is have at least 10 liquid stools a day and throughout  the night. Pt stated that the symptoms are those she had back in March and when she was positive for cdiff. Pt requesting antibiotic.  Please review and advise.

## 2023-06-16 NOTE — Telephone Encounter (Signed)
Please see notes below. Pt stated that she is still having the same symptoms. Please review and advise

## 2023-06-16 NOTE — Telephone Encounter (Signed)
Inbound call from patient, requesting follow up from call below. Advised patient, nurse is waiting on provider's recommendation.

## 2023-06-17 ENCOUNTER — Other Ambulatory Visit: Payer: Self-pay

## 2023-06-17 DIAGNOSIS — R197 Diarrhea, unspecified: Secondary | ICD-10-CM

## 2023-06-17 DIAGNOSIS — K52839 Microscopic colitis, unspecified: Secondary | ICD-10-CM

## 2023-06-17 MED ORDER — BUDESONIDE 3 MG PO CPEP: ORAL_CAPSULE | ORAL | 0 refills | Status: AC

## 2023-06-17 NOTE — Telephone Encounter (Signed)
Pt made aware of Dr. Chales Abrahams recommendations.  Prescription sent to pharmacy, Pt made aware. Pt verbalized understanding with all questions answered.

## 2023-06-17 NOTE — Telephone Encounter (Signed)
Pt with H/O collagenous colitis -Start budesonide 9mg  po qd x 8 weeks, then if better, 6mg  for 2 weeks followed by 3 mg for another 2 weeks. If not better, please call. -Avoid NSAIDs. -Can use imodium AD on as needed basis. -Continue cholestyramine as before.  RG

## 2023-06-23 ENCOUNTER — Other Ambulatory Visit: Payer: Self-pay

## 2023-06-23 DIAGNOSIS — R197 Diarrhea, unspecified: Secondary | ICD-10-CM

## 2023-06-23 NOTE — Telephone Encounter (Signed)
Pt stated that she is still having straight liquid BM's throughout the day and night. Pt stated that nothing is helping her, stating that she is taking the budesonide, Cholestyramine, along with imodium. Pt requesting for a stool sample kit to see what's going on. Please review and advise.

## 2023-06-23 NOTE — Telephone Encounter (Signed)
Lets do stool for GI pathogens including C. Difficile, calprotectin, fecal elastase RG

## 2023-06-23 NOTE — Telephone Encounter (Signed)
Inbound call from patient stating that she is still not having formed bowel movements. Patient states that she has to get up during the night to take a shower due to using the bathroom on herself. Patient states she believes the infection is back. Requesting a call on home number. Please advise.

## 2023-06-23 NOTE — Telephone Encounter (Signed)
Also  While we are waiting for stool studies Can you please start her on Lomotil 1 twice daily as needed for diarrhea # 30 tablets RG

## 2023-06-24 ENCOUNTER — Other Ambulatory Visit: Payer: Self-pay

## 2023-06-24 DIAGNOSIS — R197 Diarrhea, unspecified: Secondary | ICD-10-CM

## 2023-06-24 MED ORDER — DIPHENOXYLATE-ATROPINE 2.5-0.025 MG PO TABS: 1.0000 | ORAL_TABLET | Freq: Two times a day (BID) | ORAL | 0 refills | Status: AC | PRN

## 2023-06-24 NOTE — Telephone Encounter (Signed)
Prescription was called in for the Lomotil. Pt was made aware.  Pt made aware of stool studies to be collected. Documented in phone note.  Pt verbalized understanding with all questions answered.

## 2023-06-24 NOTE — Telephone Encounter (Signed)
Pt made aware of Dr. Chales Abrahams recommendations: Orders for labs placed.  Diatherix kit prepared for pt and pt notified to pick up stool kit on 2nd floor and pick up stool kit from basement. Pt verbalized understanding with all questions answered.

## 2023-06-25 ENCOUNTER — Other Ambulatory Visit: Payer: Medicare Other

## 2023-06-25 ENCOUNTER — Other Ambulatory Visit: Payer: Self-pay

## 2023-06-25 DIAGNOSIS — R197 Diarrhea, unspecified: Secondary | ICD-10-CM | POA: Diagnosis not present

## 2023-07-01 ENCOUNTER — Telehealth: Payer: Self-pay

## 2023-07-01 LAB — CALPROTECTIN, FECAL: Calprotectin, Fecal: 135 ug/g — ABNORMAL HIGH (ref 0–120)

## 2023-07-01 NOTE — Telephone Encounter (Signed)
Patient's diatherix came back negative. I told her that her that we are still waiting on 2 more stool results. Patient said she is still having diarrhea. She said she stopped the lomotil because it was given her diarrhea. She is taking the budesonide in the morning all 3 pills at one time. Please advise

## 2023-07-01 NOTE — Telephone Encounter (Signed)
Unable to reach pt. Phone goes directly to busy signal.

## 2023-07-02 NOTE — Telephone Encounter (Signed)
Spoke with patient & symptoms have not improved since starting budesonide. She takes 9 mg daily, along with the cholestyramine one packet daily. She stopped taking lomotil, felt that it made diarrhea worse. Still having multiple BM's throughout the day, all liquid. Denies any pain. She feels that she can't leave her house d/t incontinence. Diatherix negative. Fecal cal has resulted. Pancreatic elastase pending. Will route to MD for recommendations.

## 2023-07-03 ENCOUNTER — Other Ambulatory Visit: Payer: Self-pay

## 2023-07-03 DIAGNOSIS — R197 Diarrhea, unspecified: Secondary | ICD-10-CM

## 2023-07-03 NOTE — Telephone Encounter (Signed)
Looks like she was doing better until she got azithromycin which may cause diarrhea  Lets check stool for GI pathogen/C. Difficile  Please continue budesonide 9 mg p.o. daily for now Increase cholestyramine 4 g p.o. twice daily Can take Imodium A-D 3 times a day as needed Just to be on the safer side, lets get x-ray KUB 2 view (previously had significant stool burden) Let us know how she is in 2 weeks Avoid antibiotics RG

## 2023-07-03 NOTE — Telephone Encounter (Signed)
Pt made aware of Dr. Chales Abrahams recommendations:  Recent GI pathogen/C. Difficile negative. Pt notified to    continue budesonide 9 mg p.o. daily for now Increase cholestyramine 4 g p.o. twice daily Can take Imodium A-D 3 times a day as needed x-ray KUB 2 view: Order placed in in Epic. Pt notified to come to our basement to have xray done.  Let us know how she is in 2 weeks Avoid antibiotics Pt verbalized understanding with all questions answered.

## 2023-07-06 ENCOUNTER — Other Ambulatory Visit: Payer: Self-pay | Admitting: Cardiology

## 2023-07-06 ENCOUNTER — Ambulatory Visit
Admission: RE | Admit: 2023-07-06 | Discharge: 2023-07-06 | Disposition: A | Discharge status: Home / Self Care | Payer: Medicare Other | Source: Ambulatory Visit | Attending: Gastroenterology | Admitting: Gastroenterology

## 2023-07-06 DIAGNOSIS — R197 Diarrhea, unspecified: Secondary | ICD-10-CM

## 2023-07-06 DIAGNOSIS — I480 Paroxysmal atrial fibrillation: Secondary | ICD-10-CM

## 2023-07-08 ENCOUNTER — Encounter: Payer: Self-pay | Admitting: Gastroenterology

## 2023-07-08 NOTE — Telephone Encounter (Signed)
Prescription refill request for Eliquis received. Indication: a fib Last office visit: 05/26/23 Scr: 0.77 11/18/22 e[ic Age: 82 Weight: 54kg  Patient requires dose reduction to 2.5mg  BID. Called and spoke with husband.

## 2023-07-14 LAB — PANCREATIC ELASTASE, FECAL: Pancreatic Elastase-1, Stool: <10 ug/g — ABNORMAL LOW (ref >200)

## 2023-07-17 ENCOUNTER — Other Ambulatory Visit: Payer: Self-pay

## 2023-07-17 DIAGNOSIS — R195 Other fecal abnormalities: Secondary | ICD-10-CM

## 2023-07-17 MED ORDER — PANCRELIPASE (LIP-PROT-AMYL) 36000-114000 UNITS PO CPEP: 36000.0000 [IU] | ORAL_CAPSULE | Freq: Three times a day (TID) | ORAL | 0 refills | Status: AC

## 2023-07-20 DIAGNOSIS — E785 Hyperlipidemia, unspecified: Secondary | ICD-10-CM | POA: Diagnosis not present

## 2023-07-20 DIAGNOSIS — Z23 Encounter for immunization: Secondary | ICD-10-CM | POA: Diagnosis not present

## 2023-07-20 DIAGNOSIS — I700 Atherosclerosis of aorta: Secondary | ICD-10-CM | POA: Diagnosis not present

## 2023-07-20 DIAGNOSIS — Z952 Presence of prosthetic heart valve: Secondary | ICD-10-CM | POA: Diagnosis not present

## 2023-07-20 DIAGNOSIS — Z7901 Long term (current) use of anticoagulants: Secondary | ICD-10-CM | POA: Diagnosis not present

## 2023-07-20 DIAGNOSIS — E559 Vitamin D deficiency, unspecified: Secondary | ICD-10-CM | POA: Diagnosis not present

## 2023-07-20 DIAGNOSIS — E039 Hypothyroidism, unspecified: Secondary | ICD-10-CM | POA: Diagnosis not present

## 2023-07-20 DIAGNOSIS — I679 Cerebrovascular disease, unspecified: Secondary | ICD-10-CM | POA: Diagnosis not present

## 2023-07-20 DIAGNOSIS — F333 Major depressive disorder, recurrent, severe with psychotic symptoms: Secondary | ICD-10-CM | POA: Diagnosis not present

## 2023-07-20 DIAGNOSIS — N1831 Chronic kidney disease, stage 3a: Secondary | ICD-10-CM | POA: Diagnosis not present

## 2023-07-20 DIAGNOSIS — K529 Noninfective gastroenteritis and colitis, unspecified: Secondary | ICD-10-CM | POA: Diagnosis not present

## 2023-07-20 DIAGNOSIS — I480 Paroxysmal atrial fibrillation: Secondary | ICD-10-CM | POA: Diagnosis not present

## 2023-08-05 DIAGNOSIS — H52203 Unspecified astigmatism, bilateral: Secondary | ICD-10-CM | POA: Diagnosis not present

## 2023-08-05 DIAGNOSIS — H04123 Dry eye syndrome of bilateral lacrimal glands: Secondary | ICD-10-CM | POA: Diagnosis not present

## 2023-08-05 DIAGNOSIS — Z961 Presence of intraocular lens: Secondary | ICD-10-CM | POA: Diagnosis not present

## 2023-12-29 LAB — LAB REPORT - SCANNED: EGFR: 69

## 2024-01-06 ENCOUNTER — Other Ambulatory Visit: Payer: Self-pay | Admitting: Endocrinology

## 2024-01-06 ENCOUNTER — Other Ambulatory Visit: Payer: Self-pay | Admitting: Cardiology

## 2024-01-06 DIAGNOSIS — R7989 Other specified abnormal findings of blood chemistry: Secondary | ICD-10-CM

## 2024-01-06 DIAGNOSIS — I480 Paroxysmal atrial fibrillation: Secondary | ICD-10-CM

## 2024-01-07 ENCOUNTER — Telehealth: Payer: Self-pay | Admitting: Cardiology

## 2024-01-07 DIAGNOSIS — I480 Paroxysmal atrial fibrillation: Secondary | ICD-10-CM

## 2024-01-07 MED ORDER — APIXABAN 2.5 MG PO TABS: 2.5000 mg | ORAL_TABLET | Freq: Two times a day (BID) | ORAL | 1 refills | Status: DC

## 2024-01-07 NOTE — Telephone Encounter (Signed)
 Pharmacy calling in stating pts husbands would like the pt to have a 90 day supply. Please advise

## 2024-01-07 NOTE — Telephone Encounter (Signed)
*  STAT* If patient is at the pharmacy, call can be transferred to refill team.   1. Which medications need to be refilled? (please list name of each medication and dose if known) apixaban  (ELIQUIS ) 2.5 MG TABS tablet   2. Which pharmacy/location (including street and city if local pharmacy) is medication to be sent to?  [id number] - [place], [place] - [address]      3. Do they need a 30 day or 90 day supply? 90 day    Pt has office visit scheduled and is out of medication. Pt was given a 30 day supply but stated that was incorrect when they should get a 90 day supply for a cheaper rate.

## 2024-01-07 NOTE — Telephone Encounter (Signed)
Refill sent for a 90 day supply

## 2024-01-07 NOTE — Telephone Encounter (Addendum)
 Prescription refill request for Eliquis  received. Indication: afib  Last office visit: [place], 05/26/2023 Scr: 0.8, 12/29/2023 Age: 83 yo  Weight: 54.3 kg

## 2024-01-19 ENCOUNTER — Ambulatory Visit
Admission: RE | Admit: 2024-01-19 | Discharge: 2024-01-19 | Disposition: A | Discharge status: Home / Self Care | Source: Ambulatory Visit | Attending: Endocrinology | Admitting: Endocrinology

## 2024-01-19 DIAGNOSIS — R7989 Other specified abnormal findings of blood chemistry: Secondary | ICD-10-CM

## 2024-01-19 MED ORDER — IOPAMIDOL (ISOVUE-300) INJECTION 61%
100.0000 mL | Freq: Once | INTRAVENOUS | Status: AC | PRN
  Administered 2024-01-19: 100 mL via INTRAVENOUS

## 2024-02-23 ENCOUNTER — Encounter: Payer: Self-pay | Admitting: Gastroenterology

## 2024-02-23 ENCOUNTER — Other Ambulatory Visit (INDEPENDENT_AMBULATORY_CARE_PROVIDER_SITE_OTHER)

## 2024-02-23 ENCOUNTER — Other Ambulatory Visit: Payer: Self-pay | Admitting: Gastroenterology

## 2024-02-23 ENCOUNTER — Ambulatory Visit (INDEPENDENT_AMBULATORY_CARE_PROVIDER_SITE_OTHER): Admitting: Gastroenterology

## 2024-02-23 VITALS — BP 118/68 | HR 78 | Ht 65.0 in | Wt 121.0 lb

## 2024-02-23 DIAGNOSIS — R7989 Other specified abnormal findings of blood chemistry: Secondary | ICD-10-CM

## 2024-02-23 DIAGNOSIS — K838 Other specified diseases of biliary tract: Secondary | ICD-10-CM | POA: Diagnosis not present

## 2024-02-23 DIAGNOSIS — Z8719 Personal history of other diseases of the digestive system: Secondary | ICD-10-CM

## 2024-02-23 DIAGNOSIS — R9389 Abnormal findings on diagnostic imaging of other specified body structures: Secondary | ICD-10-CM

## 2024-02-23 DIAGNOSIS — R748 Abnormal levels of other serum enzymes: Secondary | ICD-10-CM

## 2024-02-23 DIAGNOSIS — K52831 Collagenous colitis: Secondary | ICD-10-CM

## 2024-02-23 LAB — COMPREHENSIVE METABOLIC PANEL WITH GFR
ALT: 40 U/L — ABNORMAL HIGH (ref 0–35)
AST: 33 U/L (ref 0–37)
Albumin: 4.1 g/dL (ref 3.5–5.2)
Alkaline Phosphatase: 275 U/L — ABNORMAL HIGH (ref 39–117)
BUN: 26 mg/dL — ABNORMAL HIGH (ref 6–23)
CO2: 28 meq/L (ref 19–32)
Calcium: 9.5 mg/dL (ref 8.4–10.5)
Chloride: 104 meq/L (ref 96–112)
Creatinine, Ser: 0.8 mg/dL (ref 0.40–1.20)
GFR: 69.45 mL/min (ref >60.00)
Glucose, Bld: 98 mg/dL (ref 70–99)
Potassium: 5 meq/L (ref 3.5–5.1)
Sodium: 139 meq/L (ref 135–145)
Total Bilirubin: 0.6 mg/dL (ref 0.2–1.2)
Total Protein: 7.1 g/dL (ref 6.0–8.3)

## 2024-02-23 LAB — CBC WITH DIFFERENTIAL/PLATELET
Basophils Absolute: 0.1 10*3/uL (ref 0.0–0.1)
Basophils Relative: 1 % (ref 0.0–3.0)
Eosinophils Absolute: 0.1 10*3/uL (ref 0.0–0.7)
Eosinophils Relative: 1.4 % (ref 0.0–5.0)
HCT: 39.8 % (ref 36.0–46.0)
Hemoglobin: 13.2 g/dL (ref 12.0–15.0)
Lymphocytes Relative: 33.1 % (ref 12.0–46.0)
Lymphs Abs: 2.1 10*3/uL (ref 0.7–4.0)
MCHC: 33.1 g/dL (ref 30.0–36.0)
MCV: 87.4 fl (ref 78.0–100.0)
Monocytes Absolute: 0.6 10*3/uL (ref 0.1–1.0)
Monocytes Relative: 9.2 % (ref 3.0–12.0)
Neutro Abs: 3.6 10*3/uL (ref 1.4–7.7)
Neutrophils Relative %: 55.3 % (ref 43.0–77.0)
Platelets: 159 10*3/uL (ref 150.0–400.0)
RBC: 4.56 Mil/uL (ref 3.87–5.11)
RDW: 15.9 % — ABNORMAL HIGH (ref 11.5–15.5)
WBC: 6.4 10*3/uL (ref 4.0–10.5)

## 2024-02-23 LAB — GAMMA GT: GGT: 423 U/L — ABNORMAL HIGH (ref 7–51)

## 2024-02-23 NOTE — Patient Instructions (Addendum)
 _______________________________________________________  If your blood pressure at your visit was 140/90 or greater, please contact your primary care physician to follow up on this.  _______________________________________________________  If you are age 81 or older, your body mass index should be between 23-30. Your Body mass index is 20.14 kg/m. If this is out of the aforementioned range listed, please consider follow up with your Primary Care Provider.  If you are age 59 or younger, your body mass index should be between 19-25. Your Body mass index is 20.14 kg/m. If this is out of the aformentioned range listed, please consider follow up with your Primary Care Provider.   ________________________________________________________  The [facility] providers would like to encourage you to use MYCHART to communicate with providers for non-urgent requests or questions.  Due to long hold times on the telephone, sending your provider a message by [facility] may be a faster and more efficient way to get a response.  Please allow 48 business hours for a response.  Please remember that this is for non-urgent requests.  _______________________________________________________   Your provider has requested that you go to the basement level for lab work before leaving today. Press B on the elevator. The lab is located at the first door on the left as you exit the elevator. CA19-9 can't be done due to dx codes  You have been scheduled for an MRI at [facility]  on 03-01-24. Your appointment time is 1pm. Please arrive to admitting (at main entrance of the hospital) 30 minutes prior to your appointment time for registration purposes. Please make certain not to have anything to eat or drink 6 hours prior to your test. In addition, if you have any metal in your body, have a pacemaker or defibrillator, please be sure to let your ordering physician know. This test typically takes 45 minutes to 1 hour to  complete. Should you need to reschedule, please call [phone] to do so.   It was a pleasure to see you today!  Thank you for trusting me with your gastrointestinal care!

## 2024-02-23 NOTE — Progress Notes (Signed)
 Chief Complaint: Abnormal CT/abnormal LFTs  Referring Provider:  Nichole Senior, MD      ASSESSMENT AND PLAN;   #1. Abn LFTs with abn CT showing biliary dil (prev cholecystectomy).  #2. Diarrhea (resolved) - H/O Collagenous colitis. D/d Post chole, IBS-D. R/O any infectious etiology. H/O occ incontinence.   #3. Afib on eliquis , S/P AVR (bioprosthetic valve)  Plan: -CBC, CMP, CA 19-9, CEA, alk phos iso, GGT, IgG-4, ASMA, AMA -MRCP with contrast. -FU thereafter.  Depending upon the above, may need EUS or further evaluation.    Addendum : we tried ordering CA 19-9 but unfortunately her INS would not cover it.   HPI:    Becky Winters is a 83 y.o. female  Afib on eliquis  (Nl EF 60 to 65%), bioprosthetic AVR, HLD History of Present Illness Becky Winters is an 83 year old female who presents with NEW elevated liver function tests.  Recent blood work revealed elevated liver enzymes with AST at 67, ALT at 90, and alkaline phosphatase at 221. A CT scan indicated mild dilation of the bile ducts. She has no current symptoms related to her liver function and denies itching or recent changes in bowel habits.  She lost 50 pounds over three years due to colitis-related diarrhea, which she attributes to a reaction to COVID-19 vaccinations.  She has gained most of it back.  Her diarrhea has resolved, and she currently experiences constipation. She uses cholestyramine  powder occasionally to manage her symptoms.  Her medication regimen includes cholestyramine  as needed, azithromycin  before dental appointments, atorvastatin , Restasis, and thyroxine. She also uses a vaginal cream and occasionally takes supplements. She avoids Tylenol  due to her colitis.  No H/O itching, skin lesions, easy bruisability, intake of OTC meds including diet pills, herbal medications, anabolic steroids or Tylenol . There is no H/O blood transfusions, IVDA or FH of liver disease. No jaundice, dark urine or pale  stools. No alcohol  abuse.  No nausea, vomiting, heartburn, regurgitation, odynophagia or dysphagia.  No significant diarrhea or constipation.  No melena or hematochezia. No unintentional weight loss now.  No abdominal pain.     Latest Ref Rng & Units 02/23/2024   12:27 PM 10/31/2022   10:26 AM 11/08/2019   12:05 PM  Hepatic Function  Total Protein 6.0 - 8.3 g/dL 7.1  6.1  6.6   Albumin  3.5 - 5.2 g/dL 4.1  3.1  3.9   AST 0 - 37 U/L 33  21  21   ALT 0 - 35 U/L 40  19  16   Alk Phosphatase 39 - 117 U/L 275  70  62   Total Bilirubin 0.2 - 1.2 mg/dL 0.6  0.6  1.2    Results LABS AST: 67 U/L (02/23/2024) ALT: 90 U/L (02/23/2024) ALK Phos: 221 U/L (02/23/2024) Hb: 12.9 g/dL (92/98/7974) AST: 19 U/L (10/2023) ALT: 21 U/L (10/2023) ALK Phos: 70 U/L (10/2023)     Past GI workup  CT AP 01/19/2024 with contrast Biliary ductal dilatation greater than expected for a patient status post cholecystectomy. Recommend correlation for cholestasis and consider MRCP for further evaluation.  - Multiple colonoscopies @ [facility]: 1 small hyperplastic rectal polyp removed in 2001. NO polyps in 2004, 2007, 2012 and 2017. Dx with collagenous colitis previously.  Given trial of budesonide  9 mg/day without any benefit.   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  Small bowel pattern is normal. Small hiatal hernia. Previous cholecystectomy and hysterectomy.     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 , R knee, degenerative spine ,L hip    Bicuspid aortic valve 05/10/2014   Chronic diastolic congestive heart failure (HCC)    Collagenous colitis    Compression, esophagus 08/26/2011   stretched in the past    Encounter for  therapeutic drug monitoring 06/15/2014   Exertional dyspnea 05/10/2014   Foot swelling    - LEFT-ever since she had an injury as a child    GERD (gastroesophageal reflux disease)    prevacid on occasion   H/O hiatal hernia    HLD (hyperlipidemia)    Hx of echocardiogram    post AVR >> Echo (12/15):  Mild LVH, EF 55%, AVR ok (Mean gradient (S): 10 mm Hg), mild MR, mild LAE   Hypothyroidism    Orthostatic hypotension 05/10/2014   Postoperative atrial fibrillation (HCC) 06/09/2014   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 reports that she takes Cipro if needed, last UTI- July 2015    Past Surgical History:  Procedure Laterality Date   AORTIC VALVE REPLACEMENT N/A 06/07/2014   Procedure: AORTIC VALVE REPLACEMENT  (AVR) with 23mm Aortic Perimount Magna Ease;  Surgeon: Sudie VEAR Laine, MD;  Location: [facility] OR;  Service: Open Heart Surgery;  Laterality: N/A;   APPENDECTOMY     ATRIAL FIBRILLATION ABLATION N/A 05/19/2019   Procedure: ATRIAL FIBRILLATION ABLATION;  Surgeon: Kelsie Agent, MD;  Location: [facility] INVASIVE CV LAB;  Service: Cardiovascular;  Laterality: N/A;   BLADDER SURGERY     x2 for tacking   CARDIAC CATHETERIZATION     cardiolite  2003, 2006   CHOLECYSTECTOMY     ESOPHAGEAL DILATION     implantable loop recorder removal  01/15/2022   MDT Reveal LINQ removed   INTRAOPERATIVE TRANSESOPHAGEAL ECHOCARDIOGRAM N/A 06/07/2014   Procedure: INTRAOPERATIVE TRANSESOPHAGEAL ECHOCARDIOGRAM;  Surgeon: Sudie VEAR Laine, MD;  Location: [facility] OR;  Service: Open Heart Surgery;  Laterality: N/A;   LEFT AND RIGHT HEART CATHETERIZATION WITH CORONARY ANGIOGRAM N/A 05/25/2014   Procedure: LEFT AND RIGHT HEART CATHETERIZATION WITH CORONARY ANGIOGRAM;  Surgeon: Ezra GORMAN Shuck, MD;  Location: [facility] CATH LAB;  Service:  Cardiovascular;  Laterality: N/A;   LOOP RECORDER INSERTION N/A 01/07/2018   Procedure: LOOP RECORDER INSERTION;  Surgeon: Kelsie Agent, MD;  Location: [facility] INVASIVE CV LAB;  Service: Cardiovascular;  Laterality: N/A;   NASAL SEPTUM SURGERY  08/25/1993   THUMB ARTHROSCOPY Left 08/25/2004   ? replacement of ligament    TONSILLECTOMY     TOTAL ABDOMINAL HYSTERECTOMY     PARTIAL    Family History  Problem Relation Age of Onset   CAD Father 2   Heart attack Father    Stroke Paternal Grandmother    Heart attack Paternal Grandfather    CVA Other    Hypertension Neg Hx    Colon cancer Neg Hx    Stomach cancer Neg Hx    Esophageal cancer Neg Hx     Social History   Tobacco Use   Smoking status: Never  Smokeless tobacco: Never  Vaping Use   Vaping status: Never Used  Substance Use Topics   Alcohol  use: No   Drug use: No    Current Outpatient Medications  Medication Sig Dispense Refill   apixaban  (ELIQUIS ) 2.5 MG TABS tablet Take 1 tablet (2.5 mg total) by mouth 2 (two) times daily. 180 tablet 1   atorvastatin  (LIPITOR) 20 MG tablet Take 20 mg by mouth daily.     cholestyramine  (QUESTRAN ) 4 g packet Take 1 packet (4 g total) by mouth daily. Take 2 hours before or after all other medications 30 packet 6   co-enzyme Q-10 30 MG capsule Take 30 mg by mouth daily. Pt taking 300 mg 1 a day     diphenoxylate -atropine  (LOMOTIL ) 2.5-0.025 MG tablet Take 1 tablet by mouth 2 (two) times daily as needed for diarrhea or loose stools. 30 tablet 0   doxylamine , Sleep, (UNISOM ) 25 MG tablet Take 25 mg by mouth at bedtime as needed.     Emollient (COLLAGEN EX) Apply topically.     Iron Combinations (CHROMAGEN) capsule Take 1 capsule by mouth daily.     levothyroxine  (SYNTHROID ) 112 MCG tablet Take 1 tablet by mouth daily.     Melatonin 5 MG CAPS Take by mouth.     NON FORMULARY Hyperbiotics 285 mg     Omega-3 Fatty Acids (FISH OIL OMEGA-3 PO) Take by mouth.     potassium chloride  SA (KLOR-CON   M) 20 MEQ tablet Take 1 tablet (20 mEq total) by mouth daily for 14 days. 14 tablet 0   RESTASIS 0.05 % ophthalmic emulsion Place 1 drop into both eyes in the morning and at bedtime.     azithromycin  (ZITHROMAX ) 250 MG tablet TAKE TWO TABLETS BY MOUTH 30 MINUTES BEFORE DENTAL PROCEDURE (Patient not taking: Reported on 02/23/2024) 6 tablet 1   budesonide  (ENTOCORT EC ) 3 MG 24 hr capsule -Start budesonide  9mg  daily x 8 weeks, followed by  6mg  for 2 weeks followed by 3 mg for another 2 weeks. 210 capsule 0   COLOSTRUM PO Take by mouth. (Patient not taking: Reported on 05/26/2023)     No current facility-administered medications for this visit.    Allergies  Allergen Reactions   Amiodarone  Other (See Comments)    Dizziness, impaired vision (felt like eyes were crossed)   Clindamycin/Lincomycin Itching and Rash    Full body rash   Asacol [Mesalamine] Other (See Comments)    GI Upset  Severe diarrhea    Codeine Nausea And Vomiting   Pentasa [Mesalamine Er] Other (See Comments)    Severe diarrhea    Sulfa Antibiotics Rash   Tramadol  Nausea And Vomiting   Amoxicillin Rash    Did it involve swelling of the face/tongue/throat, SOB, or low BP? No Did it involve sudden or severe rash/hives, skin peeling, or any reaction on the inside of your mouth or nose? No Did you need to seek medical attention at a hospital or doctor's office? No When did it last happen?      10 + years If all above answers are "NO", may proceed with cephalosporin use.     Review of Systems:  Constitutional: Denies fever, chills, diaphoresis, appetite change and fatigue.  HEENT: Denies photophobia, eye pain, redness, hearing loss, ear pain, congestion, sore throat, rhinorrhea, sneezing, mouth sores, neck pain, neck stiffness and tinnitus.   Respiratory: Denies SOB, DOE, cough, chest tightness,  and wheezing.   Cardiovascular: Denies chest pain, palpitations and leg swelling.  Genitourinary:  Denies dysuria, urgency,  frequency, hematuria, flank pain and difficulty urinating.  Musculoskeletal: Denies myalgias, back pain, joint swelling, arthralgias and gait problem.  Skin: No rash.  Neurological: Denies dizziness, seizures, syncope, weakness, light-headedness, numbness and headaches.  Hematological: Denies adenopathy. Easy bruising, personal or family bleeding history  Psychiatric/Behavioral: No anxiety or depression     Physical Exam:    BP 118/68   Pulse 78   Ht 5' 5 (1.651 m)   Wt 121 lb (54.9 kg)   BMI 20.14 kg/m  Wt Readings from Last 3 Encounters:  02/23/24 121 lb (54.9 kg)  05/26/23 119 lb 9.6 oz (54.3 kg)  01/27/23 121 lb (54.9 kg)   Constitutional:  Well-developed, in no acute distress. Psychiatric: Normal mood and affect. Behavior is normal. HEENT: Pupils normal.  Conjunctivae are normal. No scleral icterus. Cardiovascular: Normal rate, regular rhythm. No edema Pulmonary/chest: Effort normal and breath sounds normal. No wheezing, rales or rhonchi. Abdominal: Soft, nondistended. Nontender. Bowel sounds active throughout. There are no masses palpable. No hepatomegaly. Rectal: Deferred Neurological: Alert and oriented to person place and time. Skin: Skin is warm and dry. No rashes noted.  Data Reviewed: I have personally reviewed following labs and imaging studies  CBC:    Latest Ref Rng & Units 10/31/2022   10:26 AM 08/29/2020    4:37 PM 11/08/2019   12:05 PM  CBC  WBC 4.0 - 10.5 K/uL 9.6  7.2  8.4   Hemoglobin 12.0 - 15.0 g/dL 88.4  85.9  85.7   Hematocrit 36.0 - 46.0 % 34.7  41.7  43.5   Platelets 150.0 - 400.0 K/uL 252.0  182  190     CMP:    Latest Ref Rng & Units 11/18/2022    9:39 AM 10/31/2022   10:26 AM 08/29/2020    4:37 PM  CMP  Glucose 70 - 99 mg/dL 888  99  90   BUN 6 - 23 mg/dL 13  10  25    Creatinine 0.40 - 1.20 mg/dL 9.22  9.30  8.98   Sodium 135 - 145 mEq/L 136  136  141   Potassium 3.5 - 5.1 mEq/L 4.7  3.3  4.3   Chloride 96 - 112 mEq/L 103  102  106    CO2 19 - 32 mEq/L 26  25  23    Calcium  8.4 - 10.5 mg/dL 9.1  8.6  9.4   Total Protein 6.0 - 8.3 g/dL  6.1    Total Bilirubin 0.2 - 1.2 mg/dL  0.6    Alkaline Phos 39 - 117 U/L  70    AST 0 - 37 U/L  21    ALT 0 - 35 U/L  19      GFR: CrCl cannot be calculated (Patient's most recent lab result is older than the maximum 21 days allowed.). Liver Function Tests: No results for input(s): AST, ALT, ALKPHOS, BILITOT, PROT, ALBUMIN  in the last 168 hours. No results for input(s): LIPASE, AMYLASE in the last 168 hours. No results for input(s): AMMONIA in the last 168 hours. Coagulation Profile: No results for input(s): INR, PROTIME in the last 168 hours. HbA1C: No results for input(s): HGBA1C in the last 72 hours. Lipid Profile: No results for input(s): CHOL, HDL, LDLCALC, TRIG, CHOLHDL, LDLDIRECT in the last 72 hours. Thyroid  Function Tests: No results for input(s): TSH, T4TOTAL, FREET4, T3FREE, THYROIDAB in the last 72 hours. Anemia Panel: No results for input(s): VITAMINB12, FOLATE, FERRITIN, TIBC, IRON, RETICCTPCT in the last 72  hours.  No results found for this or any previous visit (from the past 240 hours).    Radiology Studies: No results found.    Anselm Bring, MD 02/23/2024, 11:48 AM  Cc: Nichole Senior, MD

## 2024-02-25 ENCOUNTER — Ambulatory Visit: Payer: Self-pay | Admitting: Gastroenterology

## 2024-02-27 LAB — IGG 4: IgG, Subclass 4: <1 mg/dL — ABNORMAL LOW (ref 2–96)

## 2024-02-27 LAB — MITOCHONDRIAL ANTIBODIES: Mitochondrial M2 Ab, IgG: <=20 U (ref <=20.0)

## 2024-02-27 LAB — ALKALINE PHOSPHATASE, ISOENZYMES
Alkaline Phosphatase: 319 IU/L — ABNORMAL HIGH (ref 44–121)
BONE FRACTION: 26 % (ref 14–68)
INTESTINAL FRAC.: 0 % (ref 0–18)
LIVER FRACTION: 74 % (ref 18–85)

## 2024-02-27 LAB — ANTI-SMOOTH MUSCLE ANTIBODY, IGG: Actin (Smooth Muscle) Antibody (IGG): <20 U (ref <20)

## 2024-02-27 LAB — CEA: CEA: <2 ng/mL

## 2024-03-01 ENCOUNTER — Ambulatory Visit (HOSPITAL_COMMUNITY)
Admission: RE | Admit: 2024-03-01 | Discharge: 2024-03-01 | Disposition: A | Discharge status: Home / Self Care | Source: Ambulatory Visit | Attending: Gastroenterology | Admitting: Gastroenterology

## 2024-03-01 ENCOUNTER — Other Ambulatory Visit: Payer: Self-pay | Admitting: Gastroenterology

## 2024-03-01 DIAGNOSIS — R9389 Abnormal findings on diagnostic imaging of other specified body structures: Secondary | ICD-10-CM | POA: Insufficient documentation

## 2024-03-01 DIAGNOSIS — R7989 Other specified abnormal findings of blood chemistry: Secondary | ICD-10-CM | POA: Diagnosis present

## 2024-03-01 LAB — ALKALINE PHOSPHATASE, ISOENZYMES
Alkaline Phosphatase: 331 IU/L — ABNORMAL HIGH (ref 44–121)
BONE FRACTION: 27 % (ref 14–68)
INTESTINAL FRAC.: 0 % (ref 0–18)
LIVER FRACTION: 73 % (ref 18–85)

## 2024-03-01 LAB — IGG 4: IgG, Subclass 4: 1 mg/dL — ABNORMAL LOW (ref 2–96)

## 2024-03-01 MED ORDER — GADOBUTROL 1 MMOL/ML IV SOLN
5.0000 mL | Freq: Once | INTRAVENOUS | Status: AC | PRN
  Administered 2024-03-01: 5 mL via INTRAVENOUS

## 2024-03-02 ENCOUNTER — Other Ambulatory Visit: Payer: Self-pay

## 2024-03-02 DIAGNOSIS — R7989 Other specified abnormal findings of blood chemistry: Secondary | ICD-10-CM

## 2024-03-02 DIAGNOSIS — R9389 Abnormal findings on diagnostic imaging of other specified body structures: Secondary | ICD-10-CM

## 2024-03-02 NOTE — Progress Notes (Signed)
 Please inform the patient. MRCP- neg. CBD 11 mm (postcholecystectomy) which smoothly tapers at ampulla.  No definite pancreatic masses.  Alk phos/GGT is elevated.  Isoenzymes were normal. ALT 40 (baseline 19). Neg ASMA, AMA  Becky Winters, lets get CA 19-9 Repeat CBC, CMP in 4 weeks Will ask Dr. Wilhelmenia if EUS is needed.  Send report to family physician  [name], Does she need EUS? RG

## 2024-03-03 ENCOUNTER — Other Ambulatory Visit (INDEPENDENT_AMBULATORY_CARE_PROVIDER_SITE_OTHER)

## 2024-03-03 DIAGNOSIS — R9389 Abnormal findings on diagnostic imaging of other specified body structures: Secondary | ICD-10-CM

## 2024-03-03 DIAGNOSIS — R7989 Other specified abnormal findings of blood chemistry: Secondary | ICD-10-CM

## 2024-03-04 LAB — CANCER ANTIGEN 19-9: CA 19-9: 56 U/mL — ABNORMAL HIGH (ref <34)

## 2024-03-05 NOTE — Progress Notes (Signed)
 CA 19-9: Elevated at 56 Pt willing to proceed with EUS D/W pt and husband over the phone  Patty,  Please proceed with EUS Please see [name]'s last note.   Send report to family physician

## 2024-03-09 ENCOUNTER — Other Ambulatory Visit: Payer: Self-pay

## 2024-03-09 DIAGNOSIS — R7989 Other specified abnormal findings of blood chemistry: Secondary | ICD-10-CM

## 2024-03-09 DIAGNOSIS — K8050 Calculus of bile duct without cholangitis or cholecystitis without obstruction: Secondary | ICD-10-CM

## 2024-03-09 NOTE — Progress Notes (Signed)
 EUS has been entered for 05/17/24 at 945 am at [facility] with GM

## 2024-03-09 NOTE — Progress Notes (Signed)
 EUS scheduled, pt instructed and medications reviewed.  Patient instructions mailed to home.  Patient to call with any questions or concerns.

## 2024-03-29 ENCOUNTER — Ambulatory Visit: Attending: Cardiology | Admitting: Cardiology

## 2024-03-29 ENCOUNTER — Other Ambulatory Visit (HOSPITAL_COMMUNITY): Payer: Self-pay

## 2024-03-29 ENCOUNTER — Encounter: Payer: Self-pay | Admitting: Cardiology

## 2024-03-29 VITALS — BP 125/61 | HR 50 | Ht 65.0 in | Wt 124.0 lb

## 2024-03-29 DIAGNOSIS — I5032 Chronic diastolic (congestive) heart failure: Secondary | ICD-10-CM | POA: Insufficient documentation

## 2024-03-29 DIAGNOSIS — I2510 Atherosclerotic heart disease of native coronary artery without angina pectoris: Secondary | ICD-10-CM | POA: Insufficient documentation

## 2024-03-29 DIAGNOSIS — I9789 Other postprocedural complications and disorders of the circulatory system, not elsewhere classified: Secondary | ICD-10-CM | POA: Diagnosis present

## 2024-03-29 DIAGNOSIS — I77810 Thoracic aortic ectasia: Secondary | ICD-10-CM | POA: Diagnosis present

## 2024-03-29 DIAGNOSIS — Z7901 Long term (current) use of anticoagulants: Secondary | ICD-10-CM | POA: Insufficient documentation

## 2024-03-29 DIAGNOSIS — E782 Mixed hyperlipidemia: Secondary | ICD-10-CM | POA: Diagnosis present

## 2024-03-29 DIAGNOSIS — I480 Paroxysmal atrial fibrillation: Secondary | ICD-10-CM | POA: Insufficient documentation

## 2024-03-29 DIAGNOSIS — Z953 Presence of xenogenic heart valve: Secondary | ICD-10-CM | POA: Insufficient documentation

## 2024-03-29 DIAGNOSIS — I4891 Unspecified atrial fibrillation: Secondary | ICD-10-CM | POA: Diagnosis present

## 2024-03-29 MED ORDER — AZITHROMYCIN 250 MG PO TABS
500.0000 mg | ORAL_TABLET | ORAL | 2 refills | Status: AC
  Filled 2024-03-29: qty 6, 3d supply, fill #0

## 2024-03-29 NOTE — Patient Instructions (Addendum)
 Medication Instructions:  Your physician recommends that you continue on your current medications as directed. Please refer to the Current Medication list given to you today.  *If you need a refill on your cardiac medications before your next appointment, please call your pharmacy*  Lab Work: NONE ordered at this time of appointment   Testing/Procedures: NONE ordered at this time of appointment   Follow-Up: At [facility], you and your health needs are our priority.  As part of our continuing mission to provide you with exceptional heart care, our providers are all part of one team.  This team includes your primary Cardiologist (physician) and Advanced Practice Providers or APPs (Physician Assistants and Nurse Practitioners) who all work together to provide you with the care you need, when you need it.  Your next appointment:   1 year(s)  Provider:   Oneil Parchment, MD    We recommend signing up for the patient portal called MyChart.  Sign up information is provided on this After Visit Summary.  MyChart is used to connect with patients for Virtual Visits (Telemedicine).  Patients are able to view lab/test results, encounter notes, upcoming appointments, etc.  Non-urgent messages can be sent to your provider as well.   To learn more about what you can do with MyChart, go to [email or web address].

## 2024-03-29 NOTE — Progress Notes (Signed)
 Cardiology Office Note:  .   Date:  03/29/2024  ID:  Elveria GORMAN Kerns, DOB October 25, 1940, MRN [id number] PCP: Nichole Senior, MD  [facility] Providers Cardiologist:  Oneil Parchment, MD    History of Present Illness: .   Becky Winters is a 83 y.o. female Discussed the use of AI scribe software for clinical note transcription with the patient, who gave verbal consent to proceed.  History of Present Illness Becky Winters is an 83 year old female with a 23 millimeter Perimount bioprosthetic valve in the aortic position who presents for follow-up.  She has a 23 millimeter Perimount bioprosthetic valve in the aortic position, replaced in 2015. She is on Eliquis  for anticoagulation and atorvastatin  20 mg for hyperlipidemia. A prior echocardiogram on June 11, 2023, showed a stable bioprosthetic aortic valve with an ejection fraction of 60%, grade two diastolic dysfunction, pulmonary pressures estimated at 28 mmHg, moderately dilated left atrial size, and a mean gradient of 18 mmHg. The ascending aorta was 40 mm. Pericalcification was noted on the valve in a CT scan from September 2020. She stated at her last visit that she would prefer to die before her spouse.  She underwent successful ablation of atrial fibrillation in 2020 and had a loop recorder placed in 2019, which has since been removed. Her blood pressure is often low, citing an instance of 90/50 mmHg, but she feels fine. She is on Eliquis  2.5 mg twice a day, atorvastatin  20 mg daily, coenzyme Q10, and omega-3 fish oil supplements.  She is scheduled for an endoscopy with a liver biopsy on May 17, 2024, due to critically high liver function tests noted during a physical in May. Her alkaline phosphatase was 331, ALT was 40, and GGT was 423. CA 19-9 was slightly elevated at 56. No issues with eating, vomiting, or diarrhea, although she mentions having colitis and experiencing some upset stomach this morning. She has been taking PC oil  for about a month, as recommended by her daughter for liver health.       Studies Reviewed: SABRA   EKG Interpretation Date/Time:  Tuesday March 29 2024 09:13:54 EDT Ventricular Rate:  49 PR Interval:  178 QRS Duration:  92 QT Interval:  468 QTC Calculation: 422 R Axis:   -20  Text Interpretation: Sinus bradycardia Possible Left atrial enlargement Poor R wave progression When compared with ECG of 26-May-2023 08:37, No significant change since last tracing Confirmed by Parchment Oneil ([id number]) on 03/29/2024 9:21:40 AM    Results LABS LDL: 38 (12/29/2023) HbA1c: 5.4 (12/29/2023) Hemoglobin: 13.2 (12/29/2023) Creatinine: 0.8 (12/29/2023) Potassium: 5.0 (12/29/2023) Alkaline phosphatase: 331 (12/2023) ALT: 40 (12/2023) GGT: 423 (12/2023) CA 19-9: 56  RADIOLOGY CT: Pericalcification on valve (04/2019) MRI brain: Normal  DIAGNOSTIC Echocardiogram: Stable bioprosthetic aortic valve, ejection fraction 60%, grade two diastolic dysfunction, pulmonary pressures 28 mmHg, moderately dilated left atrial size, mean gradient 18 mmHg, ascending aorta 40 mm (06/11/2023) Risk Assessment/Calculations:            Physical Exam:   VS:  BP 125/61 (BP Location: Left Arm, Patient Position: Sitting, Cuff Size: Normal)   Pulse (!) 50   Ht 5' 5 (1.651 m)   Wt 124 lb (56.2 kg)   SpO2 97%   BMI 20.63 kg/m    Wt Readings from Last 3 Encounters:  03/29/24 124 lb (56.2 kg)  02/23/24 121 lb (54.9 kg)  05/26/23 119 lb 9.6 oz (54.3 kg)    GEN: Well nourished, well developed in  no acute distress NECK: No JVD; No carotid bruits CARDIAC: RRR, no murmurs, no rubs, no gallops RESPIRATORY:  Clear to auscultation without rales, wheezing or rhonchi  ABDOMEN: Soft, non-tender, non-distended EXTREMITIES:  No edema; No deformity   ASSESSMENT AND PLAN: .    Assessment and Plan Assessment & Plan Bioprosthetic aortic valve replacement with stable function The bioprosthetic aortic valve, replaced in 2015,  shows stable function. Previous echocardiogram on June 10, 2021, indicated a stable valve with an ejection fraction of 60%, grade 2 diastolic dysfunction, and a mean gradient of 18 mmHg. She is reluctant to undergo further interventions and prefers to avoid additional echocardiograms at this time. - Hold off on echocardiogram as the valve function is stable. - We will give her azithromycin  for dental prophylaxis.  Atrial fibrillation, status post ablation Status post successful ablation of atrial fibrillation in 2020. Currently on Eliquis  2.5 mg twice daily for anticoagulation. Heart rate is about 50, and the patient reports feeling fine with no symptoms at this time. Eliquis  dose is adjusted due to age and weight. - Continue Eliquis  2.5 mg twice daily.  Hyperlipidemia, well controlled on statin therapy Hyperlipidemia is well controlled with atorvastatin  20 mg daily. Recent LDL level is 38 mg/dL, indicating excellent control. - Continue atorvastatin  20 mg daily.  Grade 2 diastolic dysfunction Grade 2 diastolic dysfunction noted on previous echocardiogram.  Preop cardiac risk for endoscopy and biopsy secondary to dilated bile duct, elevated liver enzymes - She may proceed with low overall cardiac risk - Hold Eliquis  2.5 mg for 2 days prior to procedure - Resume Eliquis  likely the day following the procedure per GI instructions.         Dispo: 54yr  Signed, Oneil Parchment, MD

## 2024-04-04 ENCOUNTER — Other Ambulatory Visit (INDEPENDENT_AMBULATORY_CARE_PROVIDER_SITE_OTHER)

## 2024-04-04 DIAGNOSIS — R7989 Other specified abnormal findings of blood chemistry: Secondary | ICD-10-CM | POA: Diagnosis not present

## 2024-04-04 DIAGNOSIS — R9389 Abnormal findings on diagnostic imaging of other specified body structures: Secondary | ICD-10-CM | POA: Diagnosis not present

## 2024-04-04 LAB — CBC WITH DIFFERENTIAL/PLATELET
Basophils Absolute: 0 K/uL (ref 0.0–0.1)
Basophils Relative: 0.6 % (ref 0.0–3.0)
Eosinophils Absolute: 0.1 K/uL (ref 0.0–0.7)
Eosinophils Relative: 1 % (ref 0.0–5.0)
HCT: 38.6 % (ref 36.0–46.0)
Hemoglobin: 12.8 g/dL (ref 12.0–15.0)
Lymphocytes Relative: 28.5 % (ref 12.0–46.0)
Lymphs Abs: 1.7 K/uL (ref 0.7–4.0)
MCHC: 33 g/dL (ref 30.0–36.0)
MCV: 86.9 fl (ref 78.0–100.0)
Monocytes Absolute: 0.6 K/uL (ref 0.1–1.0)
Monocytes Relative: 9.9 % (ref 3.0–12.0)
Neutro Abs: 3.5 K/uL (ref 1.4–7.7)
Neutrophils Relative %: 60 % (ref 43.0–77.0)
Platelets: 139 K/uL — ABNORMAL LOW (ref 150.0–400.0)
RBC: 4.45 Mil/uL (ref 3.87–5.11)
RDW: 14.6 % (ref 11.5–15.5)
WBC: 5.9 K/uL (ref 4.0–10.5)

## 2024-04-04 LAB — COMPREHENSIVE METABOLIC PANEL WITH GFR
ALT: 30 U/L (ref 0–35)
AST: 32 U/L (ref 0–37)
Albumin: 3.7 g/dL (ref 3.5–5.2)
Alkaline Phosphatase: 117 U/L (ref 39–117)
BUN: 22 mg/dL (ref 6–23)
CO2: 26 meq/L (ref 19–32)
Calcium: 8.6 mg/dL (ref 8.4–10.5)
Chloride: 101 meq/L (ref 96–112)
Creatinine, Ser: 0.81 mg/dL (ref 0.40–1.20)
GFR: 68.36 mL/min (ref >60.00)
Glucose, Bld: 110 mg/dL — ABNORMAL HIGH (ref 70–99)
Potassium: 4.4 meq/L (ref 3.5–5.1)
Sodium: 134 meq/L — ABNORMAL LOW (ref 135–145)
Total Bilirubin: 0.6 mg/dL (ref 0.2–1.2)
Total Protein: 6.1 g/dL (ref 6.0–8.3)

## 2024-04-06 ENCOUNTER — Ambulatory Visit: Payer: Self-pay | Admitting: Gastroenterology

## 2024-04-13 ENCOUNTER — Other Ambulatory Visit: Payer: Self-pay

## 2024-04-13 DIAGNOSIS — R748 Abnormal levels of other serum enzymes: Secondary | ICD-10-CM

## 2024-04-13 DIAGNOSIS — K838 Other specified diseases of biliary tract: Secondary | ICD-10-CM

## 2024-04-13 DIAGNOSIS — R9389 Abnormal findings on diagnostic imaging of other specified body structures: Secondary | ICD-10-CM

## 2024-04-13 DIAGNOSIS — K52831 Collagenous colitis: Secondary | ICD-10-CM

## 2024-04-13 DIAGNOSIS — R7989 Other specified abnormal findings of blood chemistry: Secondary | ICD-10-CM

## 2024-04-13 DIAGNOSIS — R978 Other abnormal tumor markers: Secondary | ICD-10-CM

## 2024-04-13 DIAGNOSIS — K8050 Calculus of bile duct without cholangitis or cholecystitis without obstruction: Secondary | ICD-10-CM

## 2024-04-13 NOTE — Telephone Encounter (Signed)
 These go ahead and order CA 19-9 and GGT, CBC and CMP, lipase in 4 weeks RG

## 2024-05-09 ENCOUNTER — Other Ambulatory Visit (INDEPENDENT_AMBULATORY_CARE_PROVIDER_SITE_OTHER)

## 2024-05-09 DIAGNOSIS — R748 Abnormal levels of other serum enzymes: Secondary | ICD-10-CM | POA: Diagnosis not present

## 2024-05-09 DIAGNOSIS — K8050 Calculus of bile duct without cholangitis or cholecystitis without obstruction: Secondary | ICD-10-CM

## 2024-05-09 DIAGNOSIS — K838 Other specified diseases of biliary tract: Secondary | ICD-10-CM

## 2024-05-09 DIAGNOSIS — R7989 Other specified abnormal findings of blood chemistry: Secondary | ICD-10-CM

## 2024-05-09 DIAGNOSIS — R978 Other abnormal tumor markers: Secondary | ICD-10-CM

## 2024-05-09 DIAGNOSIS — K52831 Collagenous colitis: Secondary | ICD-10-CM

## 2024-05-09 DIAGNOSIS — R9389 Abnormal findings on diagnostic imaging of other specified body structures: Secondary | ICD-10-CM

## 2024-05-09 LAB — CBC WITH DIFFERENTIAL/PLATELET
Basophils Absolute: 0 K/uL (ref 0.0–0.1)
Basophils Relative: 0.4 % (ref 0.0–3.0)
Eosinophils Absolute: 0 K/uL (ref 0.0–0.7)
Eosinophils Relative: 0.6 % (ref 0.0–5.0)
HCT: 38.3 % (ref 36.0–46.0)
Hemoglobin: 12.5 g/dL (ref 12.0–15.0)
Lymphocytes Relative: 28.5 % (ref 12.0–46.0)
Lymphs Abs: 1.9 K/uL (ref 0.7–4.0)
MCHC: 32.7 g/dL (ref 30.0–36.0)
MCV: 86.6 fl (ref 78.0–100.0)
Monocytes Absolute: 0.5 K/uL (ref 0.1–1.0)
Monocytes Relative: 7.6 % (ref 3.0–12.0)
Neutro Abs: 4.2 K/uL (ref 1.4–7.7)
Neutrophils Relative %: 62.9 % (ref 43.0–77.0)
Platelets: 148 K/uL — ABNORMAL LOW (ref 150.0–400.0)
RBC: 4.42 Mil/uL (ref 3.87–5.11)
RDW: 15.9 % — ABNORMAL HIGH (ref 11.5–15.5)
WBC: 6.6 K/uL (ref 4.0–10.5)

## 2024-05-09 LAB — COMPREHENSIVE METABOLIC PANEL WITH GFR
ALT: 19 U/L (ref 0–35)
AST: 23 U/L (ref 0–37)
Albumin: 3.9 g/dL (ref 3.5–5.2)
Alkaline Phosphatase: 89 U/L (ref 39–117)
BUN: 22 mg/dL (ref 6–23)
CO2: 28 meq/L (ref 19–32)
Calcium: 9 mg/dL (ref 8.4–10.5)
Chloride: 100 meq/L (ref 96–112)
Creatinine, Ser: 0.7 mg/dL (ref 0.40–1.20)
GFR: 81.4 mL/min (ref >60.00)
Glucose, Bld: 128 mg/dL — ABNORMAL HIGH (ref 70–99)
Potassium: 4.2 meq/L (ref 3.5–5.1)
Sodium: 137 meq/L (ref 135–145)
Total Bilirubin: 0.8 mg/dL (ref 0.2–1.2)
Total Protein: 6.1 g/dL (ref 6.0–8.3)

## 2024-05-09 LAB — GAMMA GT: GGT: 106 U/L — ABNORMAL HIGH (ref 7–51)

## 2024-05-09 LAB — LIPASE: Lipase: 29 U/L (ref 11.0–59.0)

## 2024-05-10 ENCOUNTER — Ambulatory Visit: Payer: Self-pay | Admitting: Gastroenterology

## 2024-05-10 LAB — CANCER ANTIGEN 19-9: CA 19-9: 52 U/mL — ABNORMAL HIGH (ref <34)

## 2024-05-16 ENCOUNTER — Encounter (HOSPITAL_COMMUNITY): Payer: Self-pay | Admitting: Gastroenterology

## 2024-05-17 ENCOUNTER — Other Ambulatory Visit: Payer: Self-pay

## 2024-05-17 ENCOUNTER — Ambulatory Visit (HOSPITAL_COMMUNITY)
Admission: RE | Admit: 2024-05-17 | Discharge: 2024-05-17 | Disposition: A | Discharge status: Home / Self Care | Attending: Gastroenterology | Admitting: Gastroenterology

## 2024-05-17 ENCOUNTER — Ambulatory Visit (HOSPITAL_BASED_OUTPATIENT_CLINIC_OR_DEPARTMENT_OTHER): Payer: Self-pay | Admitting: Registered Nurse

## 2024-05-17 ENCOUNTER — Encounter (HOSPITAL_COMMUNITY)
Admission: RE | Disposition: A | Discharge status: Home / Self Care | Payer: Self-pay | Source: Home / Self Care | Attending: Gastroenterology

## 2024-05-17 ENCOUNTER — Encounter (HOSPITAL_COMMUNITY): Payer: Self-pay | Admitting: Gastroenterology

## 2024-05-17 ENCOUNTER — Ambulatory Visit (HOSPITAL_COMMUNITY): Payer: Self-pay | Admitting: Registered Nurse

## 2024-05-17 DIAGNOSIS — E039 Hypothyroidism, unspecified: Secondary | ICD-10-CM

## 2024-05-17 DIAGNOSIS — I509 Heart failure, unspecified: Secondary | ICD-10-CM | POA: Diagnosis not present

## 2024-05-17 DIAGNOSIS — I480 Paroxysmal atrial fibrillation: Secondary | ICD-10-CM

## 2024-05-17 DIAGNOSIS — K222 Esophageal obstruction: Secondary | ICD-10-CM

## 2024-05-17 DIAGNOSIS — K3189 Other diseases of stomach and duodenum: Secondary | ICD-10-CM | POA: Diagnosis not present

## 2024-05-17 DIAGNOSIS — K2289 Other specified disease of esophagus: Secondary | ICD-10-CM | POA: Diagnosis not present

## 2024-05-17 DIAGNOSIS — I4891 Unspecified atrial fibrillation: Secondary | ICD-10-CM | POA: Diagnosis not present

## 2024-05-17 DIAGNOSIS — I350 Nonrheumatic aortic (valve) stenosis: Secondary | ICD-10-CM

## 2024-05-17 DIAGNOSIS — K219 Gastro-esophageal reflux disease without esophagitis: Secondary | ICD-10-CM | POA: Diagnosis not present

## 2024-05-17 DIAGNOSIS — K449 Diaphragmatic hernia without obstruction or gangrene: Secondary | ICD-10-CM | POA: Insufficient documentation

## 2024-05-17 DIAGNOSIS — K862 Cyst of pancreas: Secondary | ICD-10-CM | POA: Diagnosis not present

## 2024-05-17 DIAGNOSIS — R7989 Other specified abnormal findings of blood chemistry: Secondary | ICD-10-CM | POA: Insufficient documentation

## 2024-05-17 DIAGNOSIS — Z953 Presence of xenogenic heart valve: Secondary | ICD-10-CM | POA: Diagnosis not present

## 2024-05-17 DIAGNOSIS — K2950 Unspecified chronic gastritis without bleeding: Secondary | ICD-10-CM | POA: Diagnosis not present

## 2024-05-17 DIAGNOSIS — I2510 Atherosclerotic heart disease of native coronary artery without angina pectoris: Secondary | ICD-10-CM | POA: Diagnosis not present

## 2024-05-17 DIAGNOSIS — D759 Disease of blood and blood-forming organs, unspecified: Secondary | ICD-10-CM | POA: Insufficient documentation

## 2024-05-17 DIAGNOSIS — K838 Other specified diseases of biliary tract: Secondary | ICD-10-CM

## 2024-05-17 DIAGNOSIS — E785 Hyperlipidemia, unspecified: Secondary | ICD-10-CM | POA: Insufficient documentation

## 2024-05-17 DIAGNOSIS — I899 Noninfective disorder of lymphatic vessels and lymph nodes, unspecified: Secondary | ICD-10-CM

## 2024-05-17 DIAGNOSIS — K8050 Calculus of bile duct without cholangitis or cholecystitis without obstruction: Secondary | ICD-10-CM

## 2024-05-17 DIAGNOSIS — K828 Other specified diseases of gallbladder: Secondary | ICD-10-CM | POA: Insufficient documentation

## 2024-05-17 DIAGNOSIS — K2960 Other gastritis without bleeding: Secondary | ICD-10-CM

## 2024-05-17 DIAGNOSIS — R7401 Elevation of levels of liver transaminase levels: Secondary | ICD-10-CM | POA: Diagnosis not present

## 2024-05-17 DIAGNOSIS — M1990 Unspecified osteoarthritis, unspecified site: Secondary | ICD-10-CM | POA: Diagnosis not present

## 2024-05-17 DIAGNOSIS — F32A Depression, unspecified: Secondary | ICD-10-CM | POA: Diagnosis not present

## 2024-05-17 DIAGNOSIS — F419 Anxiety disorder, unspecified: Secondary | ICD-10-CM | POA: Insufficient documentation

## 2024-05-17 DIAGNOSIS — R978 Other abnormal tumor markers: Secondary | ICD-10-CM | POA: Insufficient documentation

## 2024-05-17 HISTORY — PX: EUS: SHX5427

## 2024-05-17 HISTORY — PX: ESOPHAGOGASTRODUODENOSCOPY: SHX5428

## 2024-05-17 SURGERY — ULTRASOUND, UPPER GI TRACT, ENDOSCOPIC
Anesthesia: Monitor Anesthesia Care

## 2024-05-17 MED ORDER — PROPOFOL 500 MG/50ML IV EMUL
INTRAVENOUS | Status: DC | PRN
  Administered 2024-05-17: 130 ug/kg/min via INTRAVENOUS
  Administered 2024-05-17: 10 mg via INTRAVENOUS
  Administered 2024-05-17: 20 mg via INTRAVENOUS
  Administered 2024-05-17: 10 mg via INTRAVENOUS

## 2024-05-17 MED ORDER — SODIUM CHLORIDE 0.9 % IV SOLN: INTRAVENOUS | Status: DC

## 2024-05-17 MED ORDER — PROPOFOL 1000 MG/100ML IV EMUL
INTRAVENOUS | Status: AC
  Filled 2024-05-17: qty 100

## 2024-05-17 MED ORDER — EPHEDRINE SULFATE-NACL 50-0.9 MG/10ML-% IV SOSY
PREFILLED_SYRINGE | INTRAVENOUS | Status: DC | PRN
  Administered 2024-05-17: 5 mg via INTRAVENOUS

## 2024-05-17 NOTE — Anesthesia Postprocedure Evaluation (Signed)
 Anesthesia Post Note  Patient: Elveria GORMAN Kerns  Procedure(s) Performed: ULTRASOUND, UPPER GI TRACT, ENDOSCOPIC     Patient location during evaluation: Endoscopy Anesthesia Type: MAC Level of consciousness: oriented, awake and alert and awake Pain management: pain level controlled Vital Signs Assessment: post-procedure vital signs reviewed and stable Respiratory status: spontaneous breathing, nonlabored ventilation, respiratory function stable and patient connected to nasal cannula oxygen Cardiovascular status: blood pressure returned to baseline and stable Postop Assessment: no headache, no backache and no apparent nausea or vomiting Anesthetic complications: no   No notable events documented.  Last Vitals:  Vitals:   05/17/24 1045 05/17/24 1051  BP: 114/75 128/68  Pulse: (!) 54 (!) 51  Resp: 13 15  Temp:    SpO2: 90% 100%    Last Pain:  Vitals:   05/17/24 1051  PainSc: 0-No pain                 Garnette FORBES Skillern

## 2024-05-17 NOTE — H&P (Signed)
 GASTROENTEROLOGY PROCEDURE H&P NOTE   Primary Care Physician: Nichole Senior, MD  HPI: Becky Winters is a 83 y.o. female  who presents for EGD/EUS to evaluate dilated bile duct rule out choledocholithiasis and elevated CA19-9 and elevated LFTs.  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 , R knee, degenerative spine ,L hip    Bicuspid aortic valve 05/10/2014   Chronic diastolic congestive heart failure (HCC)    Collagenous colitis    Compression, esophagus 08/26/2011   stretched in the past    Encounter for therapeutic drug monitoring 06/15/2014   Exertional dyspnea 05/10/2014   Foot swelling    - LEFT-ever since she had an injury as a child    GERD (gastroesophageal reflux disease)    prevacid on occasion   H/O hiatal hernia    HLD (hyperlipidemia)    Hx of echocardiogram    post AVR >> Echo (12/15):  Mild LVH, EF 55%, AVR ok (Mean gradient (S): 10 mm Hg), mild MR, mild LAE   Hypothyroidism    Orthostatic hypotension 05/10/2014   Postoperative atrial fibrillation (HCC) 06/09/2014   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/07/2014   23 mm [facility] Ease bovine pericardial tissue valve   Severe aortic stenosis 05/21/2014   s/p AVR 05/2014   UTI (urinary tract infection)    frequent UTI-----pt. reports that she takes Cipro if needed, last UTI- July 2015   Past Surgical History:  Procedure Laterality Date   AORTIC VALVE REPLACEMENT N/A 06/07/2014   Procedure: AORTIC VALVE REPLACEMENT  (AVR) with 23mm Aortic Perimount Magna Ease;  Surgeon: Sudie VEAR Laine, MD;  Location: [facility] OR;  Service: Open Heart Surgery;  Laterality: N/A;   APPENDECTOMY     ATRIAL FIBRILLATION ABLATION N/A 05/19/2019   Procedure: ATRIAL FIBRILLATION ABLATION;  Surgeon: Kelsie Agent, MD;  Location: [facility] INVASIVE CV LAB;  Service: Cardiovascular;  Laterality: N/A;   BLADDER  SURGERY     x2 for tacking   CARDIAC CATHETERIZATION     cardiolite  2003, 2006   CHOLECYSTECTOMY     ESOPHAGEAL DILATION     implantable loop recorder removal  01/15/2022   MDT Reveal LINQ removed   INTRAOPERATIVE TRANSESOPHAGEAL ECHOCARDIOGRAM N/A 06/07/2014   Procedure: INTRAOPERATIVE TRANSESOPHAGEAL ECHOCARDIOGRAM;  Surgeon: Sudie VEAR Laine, MD;  Location: [facility] OR;  Service: Open Heart Surgery;  Laterality: N/A;   LEFT AND RIGHT HEART CATHETERIZATION WITH CORONARY ANGIOGRAM N/A 05/25/2014   Procedure: LEFT AND RIGHT HEART CATHETERIZATION WITH CORONARY ANGIOGRAM;  Surgeon: Ezra GORMAN Shuck, MD;  Location: [facility] CATH LAB;  Service: Cardiovascular;  Laterality: N/A;   LOOP RECORDER INSERTION N/A 01/07/2018   Procedure: LOOP RECORDER INSERTION;  Surgeon: Kelsie Agent, MD;  Location: [facility] INVASIVE CV LAB;  Service: Cardiovascular;  Laterality: N/A;   NASAL SEPTUM SURGERY  08/25/1993   THUMB ARTHROSCOPY Left 08/25/2004   ? replacement of ligament    TONSILLECTOMY     TOTAL ABDOMINAL HYSTERECTOMY     PARTIAL   Current Facility-Administered Medications  Medication Dose Route Frequency Provider Last Rate Last Admin   0.9 %  sodium chloride  infusion   Intravenous Continuous Mansouraty, Zai Chmiel Jr., MD        Current Facility-Administered Medications:    0.9 %  sodium chloride  infusion, , Intravenous, Continuous, Mansouraty, Aloha Raddle., MD Allergies  Allergen Reactions   Amiodarone   Other (See Comments)    Dizziness, impaired vision (felt like eyes were crossed)   Clindamycin/Lincomycin Itching and Rash    Full body rash   Asacol [Mesalamine] Other (See Comments)    GI Upset  Severe diarrhea    Codeine Nausea And Vomiting   Pentasa [Mesalamine Er] Other (See Comments)    Severe diarrhea    Sulfa Antibiotics Rash   Tramadol  Nausea And Vomiting   Amoxicillin Rash    Did it involve swelling of the face/tongue/throat, SOB, or low BP? No Did it involve sudden or severe rash/hives, skin  peeling, or any reaction on the inside of your mouth or nose? No Did you need to seek medical attention at a hospital or doctor's office? No When did it last happen?      10 + years If all above answers are "NO", may proceed with cephalosporin use.    Family History  Problem Relation Age of Onset   CAD Father 85   Heart attack Father    Stroke Paternal Grandmother    Heart attack Paternal Grandfather    CVA Other    Hypertension Neg Hx    Colon cancer Neg Hx    Stomach cancer Neg Hx    Esophageal cancer Neg Hx    Social History   Socioeconomic History   Marital status: Married    Spouse name: Not on file   Number of children: 2   Years of education: Not on file   Highest education level: Not on file  Occupational History   Occupation: retired  Tobacco Use   Smoking status: Never   Smokeless tobacco: Never  Vaping Use   Vaping status: Never Used  Substance and Sexual Activity   Alcohol  use: No   Drug use: No   Sexual activity: Not on file  Other Topics Concern   Not on file  Social History Narrative   Right handed    Lives with husband      Caffeine use: some coffee/soft drinks   Social Drivers of Corporate investment banker Strain: Not on file  Food Insecurity: Not on file  Transportation Needs: Not on file  Physical Activity: Not on file  Stress: Not on file  Social Connections: Unknown (01/03/2022)   Received from [facility]   Social Network    Social Network: Not on file  Intimate Partner Violence: Unknown (11/26/2021)   Received from [facility]   HITS    Physically Hurt: Not on file    Insult or Talk Down To: Not on file    Threaten Physical Harm: Not on file    Scream or Curse: Not on file    Physical Exam: Today's Vitals   05/17/24 0842  BP: (!) 175/86  Pulse: 69  Resp: 20  Temp: 97.9 F (36.6 C)  SpO2: 98%  Weight: 53.1 kg  Height: 5' 5 (1.651 m)  PainSc: 0-No pain   Body mass index is 19.47 kg/m. GEN: NAD EYE: Sclerae  anicteric ENT: MMM CV: Non-tachycardic GI: Soft, NT/ND NEURO:  Alert & Oriented x 3  Lab Results: No results for input(s): WBC, HGB, HCT, PLT in the last 72 hours. BMET No results for input(s): NA, K, CL, CO2, GLUCOSE, BUN, CREATININE, CALCIUM  in the last 72 hours. LFT No results for input(s): PROT, ALBUMIN , AST, ALT, ALKPHOS, BILITOT, BILIDIR, IBILI in the last 72 hours. PT/INR No results for input(s): LABPROT, INR in the last 72 hours.   Impression / Plan: This  is a 83 y.o.female who presents for EGD/EUS to evaluate dilated bile duct rule out choledocholithiasis and elevated CA19-9 and elevated LFTs.  The risks of an EUS including intestinal perforation, bleeding, infection, aspiration, and medication effects were discussed as was the possibility it may not give a definitive diagnosis if a biopsy is performed.  When a biopsy of the pancreas is done as part of the EUS, there is an additional risk of pancreatitis at the rate of about 1-2%.  It was explained that procedure related pancreatitis is typically mild, although it can be severe and even life threatening, which is why we do not perform random pancreatic biopsies and only biopsy a lesion/area we feel is concerning enough to warrant the risk.   The risks and benefits of endoscopic evaluation/treatment were discussed with the patient and/or family; these include but are not limited to the risk of perforation, infection, bleeding, missed lesions, lack of diagnosis, severe illness requiring hospitalization, as well as anesthesia and sedation related illnesses.  The patient's history has been reviewed, patient examined, no change in status, and deemed stable for procedure.  The patient and/or family is agreeable to proceed.    Aloha Finner, MD [facility] Office # [phone]

## 2024-05-17 NOTE — Anesthesia Preprocedure Evaluation (Signed)
 Anesthesia Evaluation  Patient identified by MRN, date of birth, ID band Patient awake    Reviewed: Allergy & Precautions, NPO status , Patient's Chart, lab work & pertinent test results  Airway Mallampati: II  TM Distance: >3 FB Neck ROM: Full    Dental  (+) Teeth Intact, Dental Advisory Given   Pulmonary neg pulmonary ROS   Pulmonary exam normal breath sounds clear to auscultation       Cardiovascular + CAD and +CHF  Normal cardiovascular exam+ dysrhythmias (Post-op A-fib) Atrial Fibrillation + Valvular Problems/Murmurs (s/p AVR)  Rhythm:Regular Rate:Normal     Neuro/Psych  PSYCHIATRIC DISORDERS Anxiety Depression    negative neurological ROS     GI/Hepatic ,GERD  ,,Choledocholithiasis Elevated LFTs   Endo/Other  Hypothyroidism    Renal/GU negative Renal ROS     Musculoskeletal  (+) Arthritis ,    Abdominal   Peds  Hematology  (+) Blood dyscrasia (Eliquis )   Anesthesia Other Findings Day of surgery medications reviewed with the patient.  Reproductive/Obstetrics                              Anesthesia Physical Anesthesia Plan  ASA: 3  Anesthesia Plan: MAC   Post-op Pain Management: Minimal or no pain anticipated   Induction: Intravenous  PONV Risk Score and Plan: 2 and TIVA and Treatment may vary due to age or medical condition  Airway Management Planned: Simple Face Mask and Natural Airway  Additional Equipment:   Intra-op Plan:   Post-operative Plan:   Informed Consent: I have reviewed the patients History and Physical, chart, labs and discussed the procedure including the risks, benefits and alternatives for the proposed anesthesia with the patient or authorized representative who has indicated his/her understanding and acceptance.     Dental advisory given  Plan Discussed with: CRNA  Anesthesia Plan Comments:          Anesthesia Quick Evaluation

## 2024-05-17 NOTE — Discharge Instructions (Signed)

## 2024-05-17 NOTE — Transfer of Care (Signed)
 Immediate Anesthesia Transfer of Care Note  Patient: Becky Winters  Procedure(s) Performed: ULTRASOUND, UPPER GI TRACT, ENDOSCOPIC  Patient Location: PACU and Endoscopy Unit  Anesthesia Type:MAC  Level of Consciousness: awake, alert , oriented, and patient cooperative  Airway & Oxygen Therapy: Patient Spontanous Breathing and Patient connected to face mask oxygen  Post-op Assessment: Report given to RN, Post -op Vital signs reviewed and stable, and Patient moving all extremities  Post vital signs: Reviewed and stable  Last Vitals:  Vitals Value Taken Time  BP 127/52 05/17/24 10:29  Temp    Pulse 57 05/17/24 10:29  Resp 22 05/17/24 10:29  SpO2 99 % 05/17/24 10:29  Vitals shown include unfiled device data.  Last Pain:  Vitals:   05/17/24 0842  PainSc: 0-No pain         Complications: No notable events documented.

## 2024-05-17 NOTE — Anesthesia Procedure Notes (Signed)
 Procedure Name: MAC Date/Time: 05/17/2024 9:46 AM  Performed by: Memory Armida LABOR, CRNAPre-anesthesia Checklist: Patient identified, Emergency Drugs available, Suction available, Patient being monitored and Timeout performed Patient Re-evaluated:Patient Re-evaluated prior to induction Oxygen Delivery Method: Simple face mask Placement Confirmation: positive ETCO2 Dental Injury: Teeth and Oropharynx as per pre-operative assessment

## 2024-05-17 NOTE — Op Note (Addendum)
 [facility] Patient Name: Becky Winters Procedure Date: 05/17/2024 MRN: [id number] Attending MD: Aloha Finner , MD, [id number] Date of Birth: 27-May-1941 CSN: [id number] Age: 83 Admit Type: Outpatient Procedure:                Upper EUS Indications:              Common bile duct dilation (acquired) seen on MRCP,                            Elevated liver enzymes, Elevated CA 19-9 Providers:                Aloha Finner, MD, Darleene Bare, RN, Haskel Chris, Technician Referring MD:              Medicines:                Monitored Anesthesia Care Complications:            No immediate complications. Estimated Blood Loss:     Estimated blood loss was minimal. Procedure:                Pre-Anesthesia Assessment:                           - Prior to the procedure, a History and Physical                            was performed, and patient medications and                            allergies were reviewed. The patient's tolerance of                            previous anesthesia was also reviewed. The risks                            and benefits of the procedure and the sedation                            options and risks were discussed with the patient.                            All questions were answered, and informed consent                            was obtained. Prior Anticoagulants: The patient has                            taken Eliquis  (apixaban ), last dose was 2 days                            prior to procedure. ASA Grade Assessment: III - A  patient with severe systemic disease. After                            reviewing the risks and benefits, the patient was                            deemed in satisfactory condition to undergo the                            procedure.                           After obtaining informed consent, the endoscope was                            passed under direct  vision. Throughout the                            procedure, the patient's blood pressure, pulse, and                            oxygen saturations were monitored continuously. The                            [id number]-[id number] ([id number]) Olympus endoscope was introduced                            through the mouth, and advanced to the second part                            of duodenum. The [id number]-[id number] ([id number]) Olympus                            duodenoscope was introduced through the mouth, and                            advanced to the area of papilla. The [id number]                            ([id number]) Olympus endosonoscope was introduced                            through the mouth, and advanced to the duodenum for                            ultrasound examination from the stomach and                            duodenum. The upper EUS was accomplished without                            difficulty. The patient tolerated the procedure. Scope In: Scope Out: Findings:      ENDOSCOPIC FINDING: :      No gross lesions were noted in  the entire esophagus.      A non-obstructing Schatzki ring was found at the gastroesophageal       junction.      The Z-line was irregular and was found 39 cm from the incisors.      A 2 cm hiatal hernia was present.      Multiple dispersed small erosions with no bleeding and no stigmata of       recent bleeding were found in the entire examined stomach. Biopsies were       taken with a cold forceps for histology and Helicobacter pylori testing.      No gross lesions were noted in the duodenal bulb, in the first portion       of the duodenum and in the second portion of the duodenum.      The major papilla was normal.      ENDOSONOGRAPHIC FINDING: :      Anechoic lesions suggestive of two cysts were identified in the       pancreatic body and pancreatic tail. The largest lesion measured 7 mm by       4 mm in maximal cross-sectional diameter. There was no associated mass.       Otherwise there was no sign of significant endosonographic abnormality       in the pancreatic head (PD = 3.4 mm), genu of the pancreas (PD = 2.6       mm), pancreatic body (PD = 2.0 mm) and pancreatic tail (PD = 0.8 mm). No       masses, no calcifications, the pancreatic duct was regular in contour.      There was dilation in the common bile duct (3.7 -> 8.8 mm) and in the       common hepatic duct (10.1 mm). No evidence of retained       choledocholithiasis.      Endosonographic imaging of the ampulla showed no intramural       (subepithelial) lesion.      No malignant-appearing lymph nodes were visualized in the celiac region       (level 20), peripancreatic region and porta hepatis region.      Endosonographic imaging in the visualized portion of the liver showed no       mass.      The celiac region was visualized. Impression:               EGD impression:                           - No gross lesions in the entire esophagus.                            Non-obstructing Schatzki ring. Z-line irregular, 39                            cm from the incisors.                           - 2 cm hiatal hernia.                           - Erosive gastropathy with no bleeding and no  stigmata of recent bleeding. Biopsied.                           - No gross lesions in the duodenal bulb, in the                            first portion of the duodenum and in the second                            portion of the duodenum.                           - Normal major papilla.                           EUS impression:                           - Two cystic lesions were seen in the pancreatic                            body and pancreatic tail. Tissue has not been                            obtained. 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                            - Otherwise there was no sign of significant                            pathology in the pancreatic head, genu of the                            pancreas, pancreatic body and pancreatic tail.                           - There was dilation in the common bile duct and in                            the common hepatic duct. No evidence of retained                            choledocholithiasis.                           - No malignant-appearing lymph nodes were                            visualized in the celiac region (level 20),  peripancreatic region and porta hepatis region. Moderate Sedation:      Not Applicable - Patient had care per Anesthesia. Recommendation:           - The patient will be observed post-procedure,                            until all discharge criteria are met.                           - Discharge patient to home.                           - Patient has a contact number available for                            emergencies. The signs and symptoms of potential                            delayed complications were discussed with the                            patient. Return to normal activities tomorrow.                            Written discharge instructions were provided to the                            patient.                           - Resume previous diet.                           - Observe patient's clinical course.                           - Await path results.                           - Initiate Protonix  40 mg once daily.                           - Consider repeat MRI/MRCP in 1-year for pancreatic                            cyst surveillance.                           - The findings and recommendations were discussed                            with the patient.                           - The findings and recommendations were discussed  with the patient's family. Procedure Code(s):         --- Professional ---                           [id number], Esophagogastroduodenoscopy, flexible,                            transoral; with endoscopic ultrasound examination                            limited to the esophagus, stomach or duodenum, and                            adjacent structures                           43239, Esophagogastroduodenoscopy, flexible,                            transoral; with biopsy, single or multiple Diagnosis Code(s):        --- Professional ---                           K22.2, Esophageal obstruction                           K22.89, Other specified disease of esophagus                           K44.9, Diaphragmatic hernia without obstruction or                            gangrene                           K31.89, Other diseases of stomach and duodenum                           K86.2, Cyst of pancreas                           K83.8, Other specified diseases of biliary tract                           I89.9, Noninfective disorder of lymphatic vessels                            and lymph nodes, unspecified                           R74.8, Abnormal levels of other serum enzymes                           R97.8, Other abnormal tumor markers CPT copyright 2022 [employer]. All rights reserved. The codes documented in this report are preliminary and upon coder review may  be revised to meet current compliance requirements. Aloha Finner, MD 05/17/2024 10:45:00 AM Number of Addenda: 0

## 2024-05-18 ENCOUNTER — Encounter (HOSPITAL_COMMUNITY): Payer: Self-pay | Admitting: Gastroenterology

## 2024-05-19 ENCOUNTER — Ambulatory Visit: Payer: Self-pay | Admitting: Gastroenterology

## 2024-05-19 LAB — SURGICAL PATHOLOGY

## 2024-05-23 NOTE — Progress Notes (Signed)
 Thanks a lot, Becky Winters [employer], [address] see Rpt MRCP in 6 months Rpt CA 19-9, CEA, CBC, CMP, lipase at that time FU with me thereafter RG

## 2024-05-31 NOTE — Progress Notes (Signed)
Reminder created in Epic

## 2024-07-04 ENCOUNTER — Other Ambulatory Visit: Payer: Self-pay | Admitting: Cardiology

## 2024-07-04 DIAGNOSIS — I480 Paroxysmal atrial fibrillation: Secondary | ICD-10-CM

## 2024-07-04 NOTE — Telephone Encounter (Signed)
 Eliquis  2.5mg  refill request received. Patient is 83 years old, weight-53.1kg, Crea-0.70 on 05/09/24, Diagnosis-Afib, and last seen by Dr. Jeffrie on 03/29/24. Dose is appropriate based on dosing criteria. Will send in refill to requested pharmacy.
